# Patient Record
Sex: Female | Born: 1955 | Race: White | Hispanic: No | Marital: Married | Smoking: Former smoker
Health system: Southern US, Community
[De-identification: ages and names within clinical notes are randomized; demographics above are authoritative.]

## PROBLEM LIST (undated history)

## (undated) DIAGNOSIS — M8580 Other specified disorders of bone density and structure, unspecified site: Secondary | ICD-10-CM

## (undated) DIAGNOSIS — E041 Nontoxic single thyroid nodule: Secondary | ICD-10-CM

## (undated) DIAGNOSIS — D699 Hemorrhagic condition, unspecified: Secondary | ICD-10-CM

## (undated) DIAGNOSIS — J189 Pneumonia, unspecified organism: Secondary | ICD-10-CM

## (undated) DIAGNOSIS — N952 Postmenopausal atrophic vaginitis: Secondary | ICD-10-CM

## (undated) DIAGNOSIS — J45909 Unspecified asthma, uncomplicated: Secondary | ICD-10-CM

## (undated) DIAGNOSIS — Q159 Congenital malformation of eye, unspecified: Secondary | ICD-10-CM

## (undated) DIAGNOSIS — Z9289 Personal history of other medical treatment: Secondary | ICD-10-CM

## (undated) DIAGNOSIS — N814 Uterovaginal prolapse, unspecified: Secondary | ICD-10-CM

## (undated) DIAGNOSIS — Z923 Personal history of irradiation: Secondary | ICD-10-CM

## (undated) DIAGNOSIS — E039 Hypothyroidism, unspecified: Secondary | ICD-10-CM

## (undated) DIAGNOSIS — I10 Essential (primary) hypertension: Secondary | ICD-10-CM

## (undated) HISTORY — DX: Personal history of irradiation: Z92.3

## (undated) HISTORY — DX: Congenital malformation of eye, unspecified: Q15.9

## (undated) HISTORY — PX: VAGINAL HYSTERECTOMY: SUR661

## (undated) HISTORY — PX: ADENOIDECTOMY: SUR15

## (undated) HISTORY — PX: ANTERIOR AND POSTERIOR VAGINAL REPAIR: SUR5

## (undated) HISTORY — DX: Postmenopausal atrophic vaginitis: N95.2

## (undated) HISTORY — DX: Uterovaginal prolapse, unspecified: N81.4

## (undated) HISTORY — DX: Essential (primary) hypertension: I10

## (undated) HISTORY — PX: BLADDER SUSPENSION: SHX72

## (undated) HISTORY — DX: Other specified disorders of bone density and structure, unspecified site: M85.80

---
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
## 1982-04-13 DIAGNOSIS — Z9289 Personal history of other medical treatment: Secondary | ICD-10-CM

## 1982-04-13 HISTORY — PX: TUBAL LIGATION: SHX77

## 1982-04-13 HISTORY — DX: Personal history of other medical treatment: Z92.89

## 1998-05-24 ENCOUNTER — Other Ambulatory Visit: Admission: RE | Admit: 1998-05-24 | Discharge: 1998-05-24 | Payer: Self-pay | Admitting: Obstetrics and Gynecology

## 2000-07-07 ENCOUNTER — Other Ambulatory Visit: Admission: RE | Admit: 2000-07-07 | Discharge: 2000-07-07 | Payer: Self-pay | Admitting: Obstetrics and Gynecology

## 2001-07-08 ENCOUNTER — Other Ambulatory Visit: Admission: RE | Admit: 2001-07-08 | Discharge: 2001-07-08 | Payer: Self-pay | Admitting: Obstetrics and Gynecology

## 2002-07-26 ENCOUNTER — Other Ambulatory Visit: Admission: RE | Admit: 2002-07-26 | Discharge: 2002-07-26 | Payer: Self-pay | Admitting: Obstetrics and Gynecology

## 2003-08-13 ENCOUNTER — Other Ambulatory Visit: Admission: RE | Admit: 2003-08-13 | Discharge: 2003-08-13 | Payer: Self-pay | Admitting: Obstetrics and Gynecology

## 2004-08-26 ENCOUNTER — Other Ambulatory Visit: Admission: RE | Admit: 2004-08-26 | Discharge: 2004-08-26 | Payer: Self-pay | Admitting: Obstetrics and Gynecology

## 2005-09-14 ENCOUNTER — Other Ambulatory Visit: Admission: RE | Admit: 2005-09-14 | Discharge: 2005-09-14 | Payer: Self-pay | Admitting: Obstetrics and Gynecology

## 2006-04-15 ENCOUNTER — Encounter (INDEPENDENT_AMBULATORY_CARE_PROVIDER_SITE_OTHER): Payer: Self-pay | Admitting: Specialist

## 2006-04-15 ENCOUNTER — Inpatient Hospital Stay (HOSPITAL_COMMUNITY): Admission: RE | Admit: 2006-04-15 | Discharge: 2006-04-17 | Payer: Self-pay | Admitting: Obstetrics and Gynecology

## 2006-10-04 ENCOUNTER — Other Ambulatory Visit: Admission: RE | Admit: 2006-10-04 | Discharge: 2006-10-04 | Payer: Self-pay | Admitting: Obstetrics and Gynecology

## 2007-10-05 ENCOUNTER — Other Ambulatory Visit: Admission: RE | Admit: 2007-10-05 | Discharge: 2007-10-05 | Payer: Self-pay | Admitting: Obstetrics and Gynecology

## 2008-10-10 ENCOUNTER — Ambulatory Visit: Payer: Self-pay | Admitting: Obstetrics and Gynecology

## 2008-10-10 ENCOUNTER — Encounter: Payer: Self-pay | Admitting: Obstetrics and Gynecology

## 2008-10-10 ENCOUNTER — Other Ambulatory Visit: Admission: RE | Admit: 2008-10-10 | Discharge: 2008-10-10 | Payer: Self-pay | Admitting: Obstetrics and Gynecology

## 2008-11-06 DIAGNOSIS — E042 Nontoxic multinodular goiter: Secondary | ICD-10-CM | POA: Insufficient documentation

## 2008-12-19 ENCOUNTER — Ambulatory Visit: Payer: Self-pay | Admitting: Obstetrics and Gynecology

## 2009-08-07 ENCOUNTER — Ambulatory Visit: Payer: Self-pay | Admitting: Obstetrics and Gynecology

## 2009-08-19 ENCOUNTER — Ambulatory Visit: Payer: Self-pay | Admitting: Obstetrics and Gynecology

## 2009-11-13 ENCOUNTER — Other Ambulatory Visit: Admission: RE | Admit: 2009-11-13 | Discharge: 2009-11-13 | Payer: Self-pay | Admitting: Obstetrics and Gynecology

## 2009-11-13 ENCOUNTER — Ambulatory Visit: Payer: Self-pay | Admitting: Obstetrics and Gynecology

## 2010-01-02 ENCOUNTER — Ambulatory Visit: Payer: Self-pay | Admitting: Obstetrics and Gynecology

## 2010-02-11 ENCOUNTER — Ambulatory Visit: Payer: Self-pay | Admitting: Obstetrics and Gynecology

## 2010-08-29 NOTE — Op Note (Signed)
NAME:  Kelly Myers, Kelly Myers                ACCOUNT NO.:  [id number]   MEDICAL RECORD NO.:  [id number]          PATIENT TYPE:  AMB   LOCATION:  SDC                           FACILITY:  [facility]   PHYSICIAN:  Sigmund I. Patsi Sears, M.D.DATE OF BIRTH:  May 19, 1955   DATE OF PROCEDURE:  DATE OF DISCHARGE:                               OPERATIVE REPORT   PREOPERATIVE DIAGNOSES:  Stress urinary incontinence.   POSTOPERATIVE DIAGNOSIS:  Stress urinary incontinence.   OPERATION:  [employer] transvaginal pubovaginal sling.   SURGEON:  Sigmund I. Patsi Sears, M.D.   ASSISTANT:  Rande Brunt. Eda Paschal, M.D.   PROCEDURE:  With the patient in the dorsal lithotomy position, following  anterior vaginal vault repair and identification of rectocele repair,  dissection was accomplished to the pubovaginal cervical arch tissue  shelf.  Two areas 2 finger breadths above the pubic tubercle were  identified, and incisions made.  The [place] transvaginal obturator system  was then placed, with right angle clamp for tensioning behind the sling,  in front of the urethra.  Cystoscopy revealed that there was no evidence  of damage to the bladder, and indigo carmine was given, and ureteral  blue urine was identified bilaterally.  The sling was then tensioned,  and the wings cut subcutaneously.  These areas will be closed with  Dermabond.   The patient had underwent formal vaginal closure per Dr. Eda Paschal.  Packing was left in place by Dr. Eda Paschal.  Foley catheter was left in  place as well.      Sigmund I. Patsi Sears, M.D.  Electronically Signed     SIT/MEDQ  D:  04/15/2006  T:  04/15/2006  Job:  [id number]   cc:   Reuel Boom L. Eda Paschal, M.D.  Fax: [phone]

## 2010-08-29 NOTE — Discharge Summary (Signed)
Kelly Myers, Kelly Myers                ACCOUNT NO.:  [id number]   MEDICAL RECORD NO.:  [id number]          PATIENT TYPE:  INP   LOCATION:  9316                          FACILITY:  [facility]   PHYSICIAN:  Daniel L. Gottsegen, M.D.DATE OF BIRTH:  1955-04-23   DATE OF ADMISSION:  04/15/2006  DATE OF DISCHARGE:  04/17/2006                               DISCHARGE SUMMARY   The patient is a 55 year old gravida 2, para 2, AB 0 who had presented  to the office with symptomatic pelvic relaxation consisting of uterine  prolapse, cystocele and rectocele.  On the day of admission, she was  taken to the operating room.  A vaginal hysterectomy, anterior-posterior  repair, cauterization of endometriosis of right ovary and pubovaginal  sling procedure was performed.  She had a slight bit of oozing after the  procedure had been terminated, and this required further suturing while  she was still asleep in the OR, but after that, her course was  uneventful.  By the second postoperative day, she was voiding well  without her Foley with small residuals, and she was ready for discharge.  She was discharged on Cipro 500 mg b.i.d. for an additional 5 days.  She  was discharged on Vicodin 5/500 for pain relief.  She will return to see  Dr. Patsi Sears as per his instructions and return to see Dr. Eda Paschal  in 2 weeks.   CONDITION ON DISCHARGE:  Is improved.  Final pathology report not  available at time of dictation.   DISCHARGE DIAGNOSIS:  1. Uterine prolapse.  2. Cystocele, rectocele.  3. Endometriosis of the ovary.   OPERATION:  Vaginal hysterectomy, cauterization of endometriosis of  right ovary, anterior and posterior repair, pubovaginal sling.   CONDITION ON DISCHARGE:  Is improved.      Daniel L. Eda Paschal, M.D.  Electronically Signed     DLG/MEDQ  D:  04/17/2006  T:  04/17/2006  Job:  [id number]   cc:   Vonzell Schlatter. Patsi Sears, M.D.  Fax: [phone]

## 2010-08-29 NOTE — Op Note (Signed)
NAME:  Kelly Myers, Kelly Myers                ACCOUNT NO.:  [id number]   MEDICAL RECORD NO.:  [id number]          PATIENT TYPE:  AMB   LOCATION:  SDC                           [facility]   PHYSICIAN:  Daniel L. Gottsegen, M.D.DATE OF BIRTH:  [birth year]   DATE OF PROCEDURE:  04/15/2006  DATE OF DISCHARGE:                               OPERATIVE REPORT   PREOPERATIVE DIAGNOSIS:  Uterine prolapse, cystocele, rectocele.   POSTOPERATIVE DIAGNOSIS:  Uterine prolapse, cystocele, rectocele,  endometriosis.   OPERATION:  Vaginal hysterectomy, anterior and posterior repair,  cauterization of endometrial implant on the patient's right ovary,  pubovaginal sling.   SURGEON:  Daniel L. Eda Paschal, M.D.  Sigmund I. Patsi Sears, M.D.   FIRST ASSISTANT:  Timothy P. Fontaine, M.D.   FINDINGS:  The patient had almost third-degree uterine prolapse.  She  had a first-degree cystocele, second-degree rectocele.  Examination of  her ovaries revealed an endometrial implant on the right ovary of about  half a centimeter, when it was cauterized it did drain typical  endometriotic fluid.  Other than that there was no evidence of  endometriosis on either ovary or in the cul-de-sac.   DESCRIPTION OF PROCEDURE:  After adequate general endotracheal  anesthesia, the patient was placed in the dorsal lithotomy position,  prepped and draped in the usual sterile manner.  A 1:200,000 solution of  epinephrine and 0.5% Xylocaine was injected around the cervix.  A 360-  degree incision was made around the cervix.  The bladder was mobilized  superiorly as was the posterior peritoneum.  Posterior peritoneum and  vesicouterine fold of peritoneum were entered with sharp dissection.  The uterosacral ligaments were clamped and cut.  They were then sutured  to the vault laterally for good vault support.  It had also been  shortened when they were clamped and cut.  Cardinal ligaments, uterine  arteries, balance of the broad  ligament, utero-ovarian ligament, round  ligaments and fallopian tubes were successfully clamped, cut and suture  ligated, all with #1 chromic catgut.  Major vascular bundles were doubly  ligated.  The uterus was delivered and sent to pathology for tissue  diagnosis.  It was somewhat boggy.  There was prominent suture on the  anterior surface from her previous vertical cesarean section of Prolene,  but the uterus was not adherent to any other tissue.  The vaginal cuff  was then sutured to the posterior peritoneum with a running locking 0  Vicryl to eliminate any bleeding.  A modified McCall enterocele  suture  was placed with 2-0 Vicryl, which completely eliminated the cul-de-sac.  The anterior vaginal mucosa was then undermined to a level just below  the urethra and the perivesical fascia was sharply dissected free.   Dr. Patsi Sears then scrubbed in and he advised that we fix her cystocele  first prior to the sling and so her cystocele was reduced with  interrupted 2-0 Vicryl.  The patient had an excellent fascia  bilaterally, and this was brought together with figure-of-eights without  any problem, approximately 8 sutures were utilized.  Dr. Patsi Sears then  placed a  pubovaginal sling without any difficulty.  She was cystoscoped,  ureters were opened, and he had not entered the bladder during the  procedure.  The anterior vaginal mucosa was then closed with a running  locking 2-0 Vicryl also picking up fascia underneath the mucosa to  eliminate dead space and to strengthen the repair.  The cuff was then  closed with figure-of-eights of #1 chromic and then the cul-de-sac  suture was tied in place.   Attention was next turned to the posterior repair.  The patient had a  good perineum, so this was not touched.  Instead starting at the  introitus, it was undermined all the way to the top of the cuff.  Perirectal fascia was sharply dissected free.  The rectocele was  identified.  The  surgeon put his finger in the rectum and finished the  dissection so that the rectocele was completely isolated.  He regloved  and then the rectocele was eliminated with about 10 sutures of 2-0  Vicryl picking up perirectal fascia to help eliminate the rectocele.  Very little mucosa was trimmed away because we did not want to make the  patient too tight, as it was it was little tighter than we would have  liked but certainly not too tight for intercourse, and then the  posterior vaginal mucosa was closed with a running 2-0 Vicryl also  incorporating perirectal fascia.  Two sponge, needle, instrument counts.  Blood loss was 300 mL.  The patient was draining clear urine from her  Foley catheter that Dr. Patsi Sears had inserted at the end of his  procedure and the vagina was packed.  The surgeon was then recalled in  the room about 15 minutes later because she was oozing through the pack.  The pack was removed.  There were several oozers that were controlled  with 2-0 Vicryl.  The pack was replaced and she was watched and there  was no more bleeding.  The patient left the operating room in  satisfactory condition.      Daniel L. Eda Paschal, M.D.  Electronically Signed     DLG/MEDQ  D:  04/15/2006  T:  04/15/2006  Job:  [id number]

## 2010-08-29 NOTE — H&P (Signed)
NAME:  Kelly Myers, Kelly Myers                ACCOUNT NO.:  [id number]   MEDICAL RECORD NO.:  [id number]          PATIENT TYPE:  AMB   LOCATION:  SDC                           [facility]ACILITY:  [facility]   PHYSICIAN:  Daniel L. Gottsegen, M.D.DATE OF BIRTH:  02/24/1956   DATE OF ADMISSION:  04/15/2006  DATE OF DISCHARGE:                              HISTORY & PHYSICAL   CHIEF COMPLAINT:  Uterine prolapse.   HISTORY OF PRESENT ILLNESS:  The patient is a 55 year old gravida 2,  para 2, AB0, who has noticed over the past year a history of pelvic  pressure with a heaviness and a dragging.  She is also having trouble  getting stools out.  She is not really having any trouble with urination  or urinary incontinence.  She has significant uterine prolapse on exam,  which explains the above symptoms, as well as a significant rectocele.  As a result of the above, she now enters the hospital for vaginal  hysterectomy and posterior repair.  We have discussed the pros and cons  of removing her ovaries, including the issues with ovarian cancer, and  she feels that she would like to keep her ovaries, assuming they are  normal, and I think that is appropriate.  I had her assessed by Dr.  Jethro Bolus preoperatively, and he felt that she should have a  sling at the same time because he is concerned with her symptomatic  pelvic relaxation, that she will develop urinary stress incontinence, so  she will also undergo a sling at the same time.   PAST MEDICAL HISTORY:  1. The patient has severe toxemia and required cesarean section.  2. She has a multinodular goiter and is on Levoxyl for treatment of      that.  3. She also has multiple allergies and takes Nasacort.   SURGERIES:  1. T&A in 1960.  2. Oral surgery in 1974.  3. Cesarean section in 1984, along with a tubal ligation.   PRESENT MEDICATIONS:  1. Levoxyl 0.112 mg daily.  2. Multivitamins.  3. Vitamin C.  4. Calcium.  5. Nasacort.   ALLERGIES:  She is  allergic to no drugs.   FAMILY HISTORY:  Her mother, father and sister all are hypertensive.  Her sister has also had a myocardial infarction.  Family members with  heart disease include maternal grandfather and sister.  Breast cancer  reveals that her paternal grandmother had that.   SOCIAL HISTORY:  She is a nonsmoker and just drinks alcohol  occasionally.   REVIEW OF SYSTEMS:  HEENT:  Negative.  CARDIAC:  Negative.  RESPIRATORY:  Negative.  GI:  Negative.  MUSCULOSKELETAL:  Negative.  GU:  Negative.  NEUROLOGIC/PSYCHIATRIC:  Negative.  ALLERGIC/IMMUNOLOGIC/LYMPHATIC/ENDOCRINE:  A history of allergies, as  well as a history of multinodular goiter.  The third issue is that the  patient did bleed after her cesarean section.  She did have low  platelets at the time due to her toxemia.  She also cut herself recently  and had trouble stopping the bleeding, and she has a sister and a mother  who hemorrhaged after hysterectomy.  As a result of this, a preoperative  evaluation was done looking for a bleeding disorder including a normal  bleeding time and factor VIII.  PT and PTT were normal, as were her  white count and platelets.  A phone conversation with Dr. Cephas Darby was had, and he felt that time was safe to proceed with  surgery.  The patient is aware of the fact that she may be at high-risk  for bleeding.   PHYSICAL EXAMINATION:  GENERAL:  The patient is a well-developed, well-  nourished female in no acute distress.  VITAL SIGNS:  Blood pressure is 140/80, pulse 80 and regular,  respirations 16 and unlabored.  She is afebrile.  HEENT:  Within normal limits.  NECK:  Supple.  Trachea is in the midline.  Thyroid feels just slightly  nodular, even on her thyroid medication.  HEART/LUNGS:  Normal.  BREASTS:  No masses.  ABDOMEN:  Soft without guarding, rebound or masses.  PELVIC:  External is normal.  BUS is normal.  Vaginal examination  reveals a 1.5 degree rectocele.   Cervix is clean.  Uterus is  retroverted, normal size and shape with second degree prolapse.  Adnexa  failed to reveal masses.  RECTOVAGINAL:  Negative.  EXTREMITIES:  Within normal limits.   ADMISSION IMPRESSION:  1. Symptomatic uterine prolapse.  2. Symptomatic rectocele.   PLAN:  Vaginal hysterectomy and posterior repair.  Sling procedure to be  done by Dr. Jethro Bolus at the same time.  See above rationale.      Daniel L. Eda Paschal, M.D.  Electronically Signed     DLG/MEDQ  D:  04/14/2006  T:  04/14/2006  Job:  [id number]

## 2010-09-09 ENCOUNTER — Other Ambulatory Visit: Payer: Self-pay | Admitting: Internal Medicine

## 2010-09-09 DIAGNOSIS — Z1231 Encounter for screening mammogram for malignant neoplasm of breast: Secondary | ICD-10-CM

## 2010-10-09 ENCOUNTER — Ambulatory Visit
Admission: RE | Admit: 2010-10-09 | Discharge: 2010-10-09 | Disposition: A | Discharge status: Home / Self Care | Payer: PRIVATE HEALTH INSURANCE | Source: Ambulatory Visit | Attending: Internal Medicine | Admitting: Internal Medicine

## 2010-10-09 DIAGNOSIS — Z1231 Encounter for screening mammogram for malignant neoplasm of breast: Secondary | ICD-10-CM

## 2010-11-17 ENCOUNTER — Ambulatory Visit (INDEPENDENT_AMBULATORY_CARE_PROVIDER_SITE_OTHER): Payer: PRIVATE HEALTH INSURANCE | Admitting: Obstetrics and Gynecology

## 2010-11-17 ENCOUNTER — Other Ambulatory Visit (HOSPITAL_COMMUNITY)
Admission: RE | Admit: 2010-11-17 | Discharge: 2010-11-17 | Disposition: A | Discharge status: Home / Self Care | Payer: PRIVATE HEALTH INSURANCE | Source: Ambulatory Visit | Attending: Obstetrics and Gynecology | Admitting: Obstetrics and Gynecology

## 2010-11-17 ENCOUNTER — Encounter: Payer: Self-pay | Admitting: Obstetrics and Gynecology

## 2010-11-17 VITALS — BP 120/74 | Ht 64.5 in | Wt 118.0 lb

## 2010-11-17 DIAGNOSIS — Z01419 Encounter for gynecological examination (general) (routine) without abnormal findings: Secondary | ICD-10-CM

## 2010-11-17 DIAGNOSIS — M8580 Other specified disorders of bone density and structure, unspecified site: Secondary | ICD-10-CM

## 2010-11-17 DIAGNOSIS — E049 Nontoxic goiter, unspecified: Secondary | ICD-10-CM

## 2010-11-17 DIAGNOSIS — Z0000 Encounter for general adult medical examination without abnormal findings: Secondary | ICD-10-CM

## 2010-11-17 DIAGNOSIS — I10 Essential (primary) hypertension: Secondary | ICD-10-CM | POA: Insufficient documentation

## 2010-11-17 DIAGNOSIS — M899 Disorder of bone, unspecified: Secondary | ICD-10-CM

## 2010-11-17 DIAGNOSIS — E040 Nontoxic diffuse goiter: Secondary | ICD-10-CM

## 2010-11-17 DIAGNOSIS — N952 Postmenopausal atrophic vaginitis: Secondary | ICD-10-CM

## 2010-11-17 MED ORDER — ESTRADIOL 10 MCG VA TABS: 10.0000 ug | ORAL_TABLET | VAGINAL | Status: DC

## 2010-11-17 NOTE — Progress Notes (Signed)
The patient came back to see me today for her annual GYN exam. She also sees Dr. Evlyn Kanner position so does not need blood work done here. She is up-to-date on mammograms and bone densities. She does have low bone mass. She is using Vagifem for atrophic vaginitis with excellent results.  Physical examination: HEENT within normal limits. Neck: Thyroid not large. No masses. Supraclavicular nodes: not enlarged. Breasts: Examined in both sitting midline position. No skin changes and no masses. Abdomen: Soft no guarding rebound or masses or hernia. Pelvic: External: Within normal limits. BUS: Within normal limits. Vaginal:within normal limits. Good estrogen effect. No evidence of cystocele rectocele or enterocele. Cervix and uterus absent. Adnexa: No masses. Rectovaginal exam: Confirmatory and negative. Extremities: Within normal limits.  Assessment: Atrophic vaginitis  Plan continue Vagifem twice a week. Continue yearly mammograms. Bone density in the fall 2013.

## 2011-03-28 DIAGNOSIS — Q159 Congenital malformation of eye, unspecified: Secondary | ICD-10-CM

## 2011-03-28 HISTORY — DX: Congenital malformation of eye, unspecified: Q15.9

## 2011-04-20 ENCOUNTER — Telehealth: Payer: Self-pay | Admitting: *Deleted

## 2011-04-20 NOTE — Telephone Encounter (Signed)
Pt called c/o terrible hot flashes during day and worse at night. Pt informed make OV.

## 2011-04-22 ENCOUNTER — Ambulatory Visit: Payer: PRIVATE HEALTH INSURANCE | Admitting: Women's Health

## 2011-04-23 ENCOUNTER — Ambulatory Visit (INDEPENDENT_AMBULATORY_CARE_PROVIDER_SITE_OTHER): Payer: PRIVATE HEALTH INSURANCE | Admitting: Women's Health

## 2011-04-23 ENCOUNTER — Encounter: Payer: Self-pay | Admitting: Women's Health

## 2011-04-23 DIAGNOSIS — N951 Menopausal and female climacteric states: Secondary | ICD-10-CM

## 2011-04-23 MED ORDER — ESTRADIOL 0.05 MG/24HR TD PTWK: 1.0000 | MEDICATED_PATCH | TRANSDERMAL | Status: DC

## 2011-04-23 MED ORDER — ESTRADIOL 0.05 MG/24HR TD PTTW: 1.0000 | MEDICATED_PATCH | TRANSDERMAL | Status: DC

## 2011-04-23 NOTE — Progress Notes (Signed)
Patient ID: Kelly Myers, female   DOB: Oct 26, 1955, 56 y.o.   MRN: [id number] Presents with a complaint of numerous hot flushes most hours, poor sleep, and generally not feeling well. Has had some vision changes and does have followup with her ophthalmologist. States symptoms started several months ago and have worsened. Denies other changes, states weight is stable, routine a similar. FSH of 4 in 09. History of a hysterectomy and has used Vagifem twice weekly. Hypothyroid has been on the same dose of Synthroid.  Menopausal symptoms  Plan: FSH, TSH. Options were reviewed. Discussed Effexor or ERT. States would like to try ERT women's health initiative study was discussed reviewing risks for blood clots, strokes, and breast cancer. Will try estradiol patch 0.05 weekly, prescription proper use was given and reviewed. Will call for lab results and reviewed if no relief of symptoms to return to office.

## 2011-04-29 ENCOUNTER — Ambulatory Visit: Payer: PRIVATE HEALTH INSURANCE | Admitting: Obstetrics and Gynecology

## 2011-05-18 ENCOUNTER — Telehealth: Payer: Self-pay | Admitting: *Deleted

## 2011-05-18 MED ORDER — ESTRADIOL 1 MG PO TABS: 1.0000 mg | ORAL_TABLET | Freq: Every day | ORAL | Status: DC

## 2011-05-18 NOTE — Telephone Encounter (Signed)
Pt informed with the below note, rx sent to pharmacy. 

## 2011-05-18 NOTE — Telephone Encounter (Signed)
Pt was seen on 1/10 c/o hot flashes she was given rx for estradiol patch 0.05 mg. Pt said that pt is working great for hot flashes, but not for her skin. Pt said skin is red from irritation with patch on and when taken off skin still has irritation . Pt would like to switch to a pill form. Please advise

## 2011-05-18 NOTE — Telephone Encounter (Signed)
Try estradiol 1 mg by mouth daily #30 with refills until next annual exam. Call if problems.

## 2011-05-26 ENCOUNTER — Other Ambulatory Visit: Payer: Self-pay | Admitting: *Deleted

## 2011-05-26 MED ORDER — ESTRADIOL 1 MG PO TABS: 1.0000 mg | ORAL_TABLET | Freq: Every day | ORAL | Status: DC

## 2011-05-26 NOTE — Telephone Encounter (Signed)
Pt called to say that her mail order service still hasnt sent out her RX of the estrace 1mg  as a 3 month supply. The patient as for a 30day rx sent to a local pharmacy. rx sent pt informed

## 2011-10-12 ENCOUNTER — Other Ambulatory Visit: Payer: Self-pay | Admitting: Internal Medicine

## 2011-10-12 DIAGNOSIS — Z1231 Encounter for screening mammogram for malignant neoplasm of breast: Secondary | ICD-10-CM

## 2011-10-27 ENCOUNTER — Ambulatory Visit
Admission: RE | Admit: 2011-10-27 | Discharge: 2011-10-27 | Disposition: A | Discharge status: Home / Self Care | Payer: PRIVATE HEALTH INSURANCE | Source: Ambulatory Visit | Attending: Internal Medicine | Admitting: Internal Medicine

## 2011-10-27 DIAGNOSIS — Z1231 Encounter for screening mammogram for malignant neoplasm of breast: Secondary | ICD-10-CM

## 2011-11-18 ENCOUNTER — Ambulatory Visit (INDEPENDENT_AMBULATORY_CARE_PROVIDER_SITE_OTHER): Payer: PRIVATE HEALTH INSURANCE | Admitting: Obstetrics and Gynecology

## 2011-11-18 ENCOUNTER — Encounter: Payer: Self-pay | Admitting: Obstetrics and Gynecology

## 2011-11-18 VITALS — BP 110/70 | Ht 64.0 in | Wt 120.0 lb

## 2011-11-18 DIAGNOSIS — N814 Uterovaginal prolapse, unspecified: Secondary | ICD-10-CM | POA: Insufficient documentation

## 2011-11-18 DIAGNOSIS — N952 Postmenopausal atrophic vaginitis: Secondary | ICD-10-CM

## 2011-11-18 DIAGNOSIS — M949 Disorder of cartilage, unspecified: Secondary | ICD-10-CM

## 2011-11-18 DIAGNOSIS — Z01419 Encounter for gynecological examination (general) (routine) without abnormal findings: Secondary | ICD-10-CM

## 2011-11-18 DIAGNOSIS — M8580 Other specified disorders of bone density and structure, unspecified site: Secondary | ICD-10-CM

## 2011-11-18 DIAGNOSIS — N809 Endometriosis, unspecified: Secondary | ICD-10-CM | POA: Insufficient documentation

## 2011-11-18 DIAGNOSIS — M899 Disorder of bone, unspecified: Secondary | ICD-10-CM

## 2011-11-18 MED ORDER — ESTRADIOL 10 MCG VA TABS: 10.0000 ug | ORAL_TABLET | VAGINAL | Status: DC

## 2011-11-18 MED ORDER — ESTRADIOL 1 MG PO TABS: 1.0000 mg | ORAL_TABLET | Freq: Every day | ORAL | Status: DC

## 2011-11-18 NOTE — Patient Instructions (Addendum)
Schedule bone density.    

## 2011-11-18 NOTE — Progress Notes (Signed)
Patient came to see me today for her annual GYN exam. She remains on oral estradiol with good control of menopausal symptoms. She first tried an  estrogen patch but had problems with it. She uses Vagifem for atrophic vaginitis with excellent results. She is having no vaginal bleeding. She is having no pelvic pain. She does not have dyspareunia. She has always had normal Pap smears. Her last Pap smear was 2012. She is up-to-date on mammograms. She does her lab work through PCP. Her last bone density was September, 2011 and showed osteopenia without an elevated fracture risk.  HEENT: Within normal limits. Kelly Myers present. Neck: No masses. Supraclavicular lymph nodes: Not enlarged. Breasts: Examined in both sitting and lying position. Symmetrical without skin changes or masses. Abdomen: Soft no masses guarding or rebound. No hernias. Pelvic: External within normal limits. BUS within normal limits. Vaginal examination shows good estrogen effect, no cystocele enterocele or rectocele. Cervix and uterus absent. Adnexa within normal limits. Rectovaginal confirmatory. Extremities within normal limits.  Assessment: Menopausal symptoms  Plan: Continue estradiol 1 mg daily. Continue Vagifem. Continue yearly mammograms. Patient to  schedule bone density.

## 2011-11-19 LAB — URINALYSIS W MICROSCOPIC + REFLEX CULTURE
Bilirubin Urine: NEGATIVE
Glucose, UA: NEGATIVE mg/dL
Hgb urine dipstick: NEGATIVE
Protein, ur: NEGATIVE mg/dL

## 2012-01-12 ENCOUNTER — Ambulatory Visit (INDEPENDENT_AMBULATORY_CARE_PROVIDER_SITE_OTHER): Payer: PRIVATE HEALTH INSURANCE

## 2012-01-12 DIAGNOSIS — M949 Disorder of cartilage, unspecified: Secondary | ICD-10-CM

## 2012-01-12 DIAGNOSIS — M8580 Other specified disorders of bone density and structure, unspecified site: Secondary | ICD-10-CM

## 2012-01-12 DIAGNOSIS — M899 Disorder of bone, unspecified: Secondary | ICD-10-CM

## 2012-01-18 ENCOUNTER — Encounter: Payer: Self-pay | Admitting: Obstetrics and Gynecology

## 2012-09-28 ENCOUNTER — Other Ambulatory Visit: Payer: Self-pay

## 2012-09-28 DIAGNOSIS — Z1231 Encounter for screening mammogram for malignant neoplasm of breast: Secondary | ICD-10-CM

## 2012-10-27 ENCOUNTER — Ambulatory Visit: Payer: PRIVATE HEALTH INSURANCE

## 2012-11-09 ENCOUNTER — Ambulatory Visit
Admission: RE | Admit: 2012-11-09 | Discharge: 2012-11-09 | Disposition: A | Discharge status: Home / Self Care | Payer: PRIVATE HEALTH INSURANCE | Source: Ambulatory Visit

## 2012-11-09 DIAGNOSIS — Z1231 Encounter for screening mammogram for malignant neoplasm of breast: Secondary | ICD-10-CM

## 2012-11-10 ENCOUNTER — Other Ambulatory Visit: Payer: Self-pay | Admitting: Women's Health

## 2012-11-10 DIAGNOSIS — R928 Other abnormal and inconclusive findings on diagnostic imaging of breast: Secondary | ICD-10-CM

## 2012-11-16 ENCOUNTER — Ambulatory Visit
Admission: RE | Admit: 2012-11-16 | Discharge: 2012-11-16 | Disposition: A | Discharge status: Home / Self Care | Payer: PRIVATE HEALTH INSURANCE | Source: Ambulatory Visit | Attending: Women's Health | Admitting: Women's Health

## 2012-11-16 ENCOUNTER — Other Ambulatory Visit: Payer: Self-pay | Admitting: Gynecology

## 2012-11-16 DIAGNOSIS — R928 Other abnormal and inconclusive findings on diagnostic imaging of breast: Secondary | ICD-10-CM

## 2012-11-19 ENCOUNTER — Emergency Department (HOSPITAL_COMMUNITY): Payer: PRIVATE HEALTH INSURANCE

## 2012-11-19 ENCOUNTER — Encounter (HOSPITAL_COMMUNITY): Payer: Self-pay | Admitting: Emergency Medicine

## 2012-11-19 ENCOUNTER — Emergency Department (HOSPITAL_COMMUNITY)
Admission: EM | Admit: 2012-11-19 | Discharge: 2012-11-19 | Disposition: A | Discharge status: Home / Self Care | Payer: PRIVATE HEALTH INSURANCE | Attending: Emergency Medicine | Admitting: Emergency Medicine

## 2012-11-19 DIAGNOSIS — S61409A Unspecified open wound of unspecified hand, initial encounter: Secondary | ICD-10-CM | POA: Insufficient documentation

## 2012-11-19 DIAGNOSIS — N952 Postmenopausal atrophic vaginitis: Secondary | ICD-10-CM | POA: Insufficient documentation

## 2012-11-19 DIAGNOSIS — Z862 Personal history of diseases of the blood and blood-forming organs and certain disorders involving the immune mechanism: Secondary | ICD-10-CM | POA: Insufficient documentation

## 2012-11-19 DIAGNOSIS — Z8742 Personal history of other diseases of the female genital tract: Secondary | ICD-10-CM | POA: Insufficient documentation

## 2012-11-19 DIAGNOSIS — IMO0002 Reserved for concepts with insufficient information to code with codable children: Secondary | ICD-10-CM | POA: Insufficient documentation

## 2012-11-19 DIAGNOSIS — Z8639 Personal history of other endocrine, nutritional and metabolic disease: Secondary | ICD-10-CM | POA: Insufficient documentation

## 2012-11-19 DIAGNOSIS — Y929 Unspecified place or not applicable: Secondary | ICD-10-CM | POA: Insufficient documentation

## 2012-11-19 DIAGNOSIS — Z87891 Personal history of nicotine dependence: Secondary | ICD-10-CM | POA: Insufficient documentation

## 2012-11-19 DIAGNOSIS — S61451A Open bite of right hand, initial encounter: Secondary | ICD-10-CM

## 2012-11-19 DIAGNOSIS — Z23 Encounter for immunization: Secondary | ICD-10-CM | POA: Insufficient documentation

## 2012-11-19 DIAGNOSIS — Y9389 Activity, other specified: Secondary | ICD-10-CM | POA: Insufficient documentation

## 2012-11-19 DIAGNOSIS — Q159 Congenital malformation of eye, unspecified: Secondary | ICD-10-CM | POA: Insufficient documentation

## 2012-11-19 DIAGNOSIS — M899 Disorder of bone, unspecified: Secondary | ICD-10-CM | POA: Insufficient documentation

## 2012-11-19 DIAGNOSIS — T148XXA Other injury of unspecified body region, initial encounter: Secondary | ICD-10-CM

## 2012-11-19 DIAGNOSIS — W540XXA Bitten by dog, initial encounter: Secondary | ICD-10-CM | POA: Insufficient documentation

## 2012-11-19 DIAGNOSIS — I10 Essential (primary) hypertension: Secondary | ICD-10-CM | POA: Insufficient documentation

## 2012-11-19 DIAGNOSIS — Z79899 Other long term (current) drug therapy: Secondary | ICD-10-CM | POA: Insufficient documentation

## 2012-11-19 DIAGNOSIS — M949 Disorder of cartilage, unspecified: Secondary | ICD-10-CM | POA: Insufficient documentation

## 2012-11-19 MED ORDER — AMOXICILLIN-POT CLAVULANATE 875-125 MG PO TABS
1.0000 | ORAL_TABLET | Freq: Once | ORAL | Status: AC
  Administered 2012-11-19: 1 via ORAL
  Filled 2012-11-19: qty 1

## 2012-11-19 MED ORDER — AMOXICILLIN-POT CLAVULANATE 875-125 MG PO TABS: 1.0000 | ORAL_TABLET | Freq: Two times a day (BID) | ORAL | Status: DC

## 2012-11-19 MED ORDER — HYDROCODONE-ACETAMINOPHEN 5-325 MG PO TABS: 1.0000 | ORAL_TABLET | ORAL | Status: DC | PRN

## 2012-11-19 MED ORDER — TETANUS-DIPHTH-ACELL PERTUSSIS 5-2.5-18.5 LF-MCG/0.5 IM SUSP
0.5000 mL | Freq: Once | INTRAMUSCULAR | Status: AC
  Administered 2012-11-19: 0.5 mL via INTRAMUSCULAR
  Filled 2012-11-19: qty 0.5

## 2012-11-19 NOTE — ED Provider Notes (Signed)
CSN: [id number]     Arrival date & time 11/19/12  1844 History     First MD Initiated Contact with Patient 11/19/12 1906     Chief Complaint  Patient presents with  . Animal Bite    The history is provided by the patient. No language interpreter was used.   HPI Comments: SHADOW STIGGERS is a 57 y.o. female who presents to the Emergency Department complaining of a bite to her right hand by a dog familiar to her. The bite occurred this afternoon. She has puncture marks to her right index finger as well as to the dorsal aspect of her right hand. The pt has also experienced swelling and bruising to her hand. The bleeding is well-controlled. She is able to move the fingers on her right hand. The pt reports she believes the dog's shots are up to date. The pt does not recall her last tetanus.   Past Medical History  Diagnosis Date  . Goiter   . Osteopenia   . Atrophic vaginitis   . Uterine prolapse   . Cystocele   . Rectocele   . 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 EYE ALLERGIES  . Hypertension   . Endometriosis    Past Surgical History  Procedure Laterality Date  . Mouth surgery    . Adenoidectomy    . Cesarean section    . Tubal ligation    . Vaginal hysterectomy      Vag Hyst -A/P repair  . Bladder suspension     Family History  Problem Relation Age of Onset  . Hypertension Mother   . Hypertension Father   . Cancer Father     lung  . Hypertension Sister   . Heart disease Sister   . Heart disease Maternal Grandfather   . Breast cancer Paternal Grandmother     Age 77   History  Substance Use Topics  . Smoking status: Former Games developer  . Smokeless tobacco: Never Used  . Alcohol Use: 2.5 oz/week    5 drink(s) per week   OB History   Grav Para Term Preterm Abortions TAB SAB Ect Mult Living   2 2 1 1      2      Review of Systems  Constitutional: Negative for fever and chills.  Skin: Positive for wound.  All other systems reviewed and are negative.    Allergies   Macrobid and Macrodantin  Home Medications   Current Outpatient Rx  Name  Route  Sig  Dispense  Refill  . Calcium Carbonate-Vitamin D (CALCIUM + D PO)   Oral   Take by mouth daily.           . Cetirizine HCl (ZYRTEC ALLERGY) 10 MG CAPS   Oral   Take by mouth as needed.           . cholecalciferol (VITAMIN D) 1000 UNITS tablet   Oral   Take 1,000 Units by mouth daily.           Marland Kitchen estradiol (ESTRACE) 1 MG tablet   Oral   Take 1 tablet (1 mg total) by mouth daily.   90 tablet   4   . Estradiol (VAGIFEM) 10 MCG TABS   Vaginal   Place 1 tablet (10 mcg total) vaginally 2 (two) times a week.   24 tablet   4   . fish oil-omega-3 fatty acids 1000 MG capsule   Oral   Take 2 g by mouth daily.           Marland Kitchen  fluticasone (VERAMYST) 27.5 MCG/SPRAY nasal spray   Nasal   Place 2 sprays into the nose daily.           Marland Kitchen levothyroxine (SYNTHROID, LEVOTHROID) 112 MCG tablet   Oral   Take 112 mcg by mouth daily.           Marland Kitchen lisinopril (PRINIVIL,ZESTRIL) 10 MG tablet   Oral   Take 10 mg by mouth daily.           . montelukast (SINGULAIR) 10 MG tablet   Oral   Take 10 mg by mouth at bedtime.            Triage Vitals: BP 181/100  Pulse 83  Resp 17  SpO2 99%  Physical Exam  Nursing note and vitals reviewed. Constitutional: She is oriented to person, place, and time. She appears well-developed and well-nourished. No distress.  HENT:  Head: Normocephalic and atraumatic.  Eyes: EOM are normal.  Neck: Neck supple. No tracheal deviation present.  Cardiovascular: Normal rate.   Pulmonary/Chest: Effort normal. No respiratory distress.  Musculoskeletal: Normal range of motion.  Neurological: She is alert and oriented to person, place, and time.  Skin: Skin is warm and dry.  2 small puncture wounds to dorsal aspect of right hand and multiple puncture wounds around base of right index finger. Full ROM. Bruising noted to the palmar aspect at base of right index finger.    Psychiatric: She has a normal mood and affect. Her behavior is normal.    ED Course   DIAGNOSTIC STUDIES: Oxygen Saturation is 99% on room air, normal by my interpretation.    COORDINATION OF CARE:  7:09 PM-  Discussed treatment plan with patient which includes a tetanus booster and imaging, and the patient agreed to the plan.   Procedures (including critical care time)  Labs Reviewed - No data to display Dg Hand Complete Right  11/19/2012   *RADIOLOGY REPORT*  Clinical Data: Dog bite  RIGHT HAND - COMPLETE 3+ VIEW  Comparison: None.  Findings: No acute fracture and no dislocation.  IMPRESSION: Acute bony pathology.   Original Report Authenticated By: Jolaine Click, M.D.   1. Animal bite of hand, right, initial encounter   2. Puncture wound     MDM  Wound cleaned:pt has full rom:pt given strict follow up instructions:pt given tetanus:no need for rabies:pt given hand follow up and augmentin and vicodin  I personally performed the services described in this documentation, which was scribed in my presence. The recorded information has been reviewed and is accurate.    Teressa Lower, NP 11/19/12 2001

## 2012-11-19 NOTE — ED Notes (Signed)
Pt's finger began bleeding again after she went to bathroom,  Pt's hand rebandaged and cleaned,  Pt given verbal instructions, and she states she and her husband understand,  Discharging per Virgel Manifold RN

## 2012-11-19 NOTE — ED Notes (Signed)
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

## 2012-11-23 NOTE — ED Provider Notes (Signed)
Medical screening examination/treatment/procedure(s) were performed by non-physician practitioner and as supervising physician I was immediately available for consultation/collaboration.  Derwood Kaplan, MD 11/23/12 1425

## 2012-11-30 ENCOUNTER — Ambulatory Visit (INDEPENDENT_AMBULATORY_CARE_PROVIDER_SITE_OTHER): Payer: 59 | Admitting: Women's Health

## 2012-11-30 ENCOUNTER — Encounter: Payer: Self-pay | Admitting: Women's Health

## 2012-11-30 VITALS — BP 126/80 | Ht 64.0 in | Wt 121.0 lb

## 2012-11-30 DIAGNOSIS — N8111 Cystocele, midline: Secondary | ICD-10-CM

## 2012-11-30 DIAGNOSIS — Z01419 Encounter for gynecological examination (general) (routine) without abnormal findings: Secondary | ICD-10-CM

## 2012-11-30 DIAGNOSIS — N814 Uterovaginal prolapse, unspecified: Secondary | ICD-10-CM

## 2012-11-30 DIAGNOSIS — IMO0002 Reserved for concepts with insufficient information to code with codable children: Secondary | ICD-10-CM

## 2012-11-30 DIAGNOSIS — N952 Postmenopausal atrophic vaginitis: Secondary | ICD-10-CM

## 2012-11-30 MED ORDER — ESTRADIOL 1 MG PO TABS: 1.0000 mg | ORAL_TABLET | Freq: Every day | ORAL | Status: DC

## 2012-11-30 MED ORDER — ESTRADIOL 10 MCG VA TABS: 10.0000 ug | ORAL_TABLET | VAGINAL | Status: DC

## 2012-11-30 NOTE — Patient Instructions (Addendum)
Health Recommendations for Postmenopausal Women Respected and ongoing research has looked at the most common causes of death, disability, and poor quality of life in postmenopausal women. The causes include heart disease, diseases of blood vessels, diabetes, depression, cancer, and bone loss (osteoporosis). Many things can be done to help lower the chances of developing these and other common problems: CARDIOVASCULAR DISEASE Heart Disease: A heart attack is a medical emergency. Know the signs and symptoms of a heart attack. Below are things women can do to reduce their risk for heart disease.   Do not smoke. If you smoke, quit.  Aim for a healthy weight. Being overweight causes many preventable deaths. Eat a healthy and balanced diet and drink an adequate amount of liquids.  Get moving. Make a commitment to be more physically active. Aim for 30 minutes of activity on most, if not all days of the week.  Eat for heart health. Choose a diet that is low in saturated fat and cholesterol and eliminate trans fat. Include whole grains, vegetables, and fruits. Read and understand the labels on food containers before buying.  Know your numbers. Ask your caregiver to check your blood pressure, cholesterol (total, HDL, LDL, triglycerides) and blood glucose. Work with your caregiver on improving your entire clinical picture.  High blood pressure. Limit or stop your table salt intake (try salt substitute and food seasonings). Avoid salty foods and drinks. Read labels on food containers before buying. Eating well and exercising can help control high blood pressure. STROKE  Stroke is a medical emergency. Stroke may be the result of a blood clot in a blood vessel in the brain or by a brain hemorrhage (bleeding). Know the signs and symptoms of a stroke. To lower the risk of developing a stroke:  Avoid fatty foods.  Quit smoking.  Control your diabetes, blood pressure, and irregular heart rate. THROMBOPHLEBITIS  (BLOOD CLOT) OF THE LEG  Becoming overweight and leading a stationary lifestyle may also contribute to developing blood clots. Controlling your diet and exercising will help lower the risk of developing blood clots. CANCER SCREENING  Breast Cancer: Take steps to reduce your risk of breast cancer.  You should practice "breast self-awareness." This means understanding the normal appearance and feel of your breasts and should include breast self-examination. Any changes detected, no matter how small, should be reported to your caregiver.  After age 40, you should have a clinical breast exam (CBE) every year.  Starting at age 40, you should consider having a mammogram (breast X-ray) every year.  If you have a family history of breast cancer, talk to your caregiver about genetic screening.  If you are at high risk for breast cancer, talk to your caregiver about having an MRI and a mammogram every year.  Intestinal or Stomach Cancer: Tests to consider are a rectal exam, fecal occult blood, sigmoidoscopy, and colonoscopy. Women who are high risk may need to be screened at an earlier age and more often.  Cervical Cancer:  Beginning at age 30, you should have a Pap test every 3 years as long as the past 3 Pap tests have been normal.  If you have had past treatment for cervical cancer or a condition that could lead to cancer, you need Pap tests and screening for cancer for at least 20 years after your treatment.  If you had a hysterectomy for a problem that was not cancer or a condition that could lead to cancer, then you no longer need Pap tests.    If you are between ages 65 and 70, and you have had normal Pap tests going back 10 years, you no longer need Pap tests.  If Pap tests have been discontinued, risk factors (such as a new sexual partner) need to be reassessed to determine if screening should be resumed.  Some medical problems can increase the chance of getting cervical cancer. In these  cases, your caregiver may recommend more frequent screening and Pap tests.  Uterine Cancer: If you have vaginal bleeding after reaching menopause, you should notify your caregiver.  Ovarian cancer: Other than yearly pelvic exams, there are no reliable tests available to screen for ovarian cancer at this time except for yearly pelvic exams.  Lung Cancer: Yearly chest X-rays can detect lung cancer and should be done on high risk women, such as cigarette smokers and women with chronic lung disease (emphysema).  Skin Cancer: A complete body skin exam should be done at your yearly examination. Avoid overexposure to the sun and ultraviolet light lamps. Use a strong sun block cream when in the sun. All of these things are important in lowering the risk of skin cancer. MENOPAUSE Menopause Symptoms: Hormone therapy products are effective for treating symptoms associated with menopause:  Moderate to severe hot flashes.  Night sweats.  Mood swings.  Headaches.  Tiredness.  Loss of sex drive.  Insomnia.  Other symptoms. Hormone replacement carries certain risks, especially in older women. Women who use or are thinking about using estrogen or estrogen with progestin treatments should discuss that with their caregiver. Your caregiver will help you understand the benefits and risks. The ideal dose of hormone replacement therapy is not known. The Food and Drug Administration (FDA) has concluded that hormone therapy should be used only at the lowest doses and for the shortest amount of time to reach treatment goals.  OSTEOPOROSIS Protecting Against Bone Loss and Preventing Fracture: If you use hormone therapy for prevention of bone loss (osteoporosis), the risks for bone loss must outweigh the risk of the therapy. Ask your caregiver about other medications known to be safe and effective for preventing bone loss and fractures. To guard against bone loss or fractures, the following is recommended:  If  you are less than age 50, take 1000 mg of calcium and at least 600 mg of Vitamin D per day.  If you are greater than age 50 but less than age 70, take 1200 mg of calcium and at least 600 mg of Vitamin D per day.  If you are greater than age 70, take 1200 mg of calcium and at least 800 mg of Vitamin D per day. Smoking and excessive alcohol intake increases the risk of osteoporosis. Eat foods rich in calcium and vitamin D and do weight bearing exercises several times a week as your caregiver suggests. DIABETES Diabetes Melitus: If you have Type I or Type 2 diabetes, you should keep your blood sugar under control with diet, exercise and recommended medication. Avoid too many sweets, starchy and fatty foods. Being overweight can make control more difficult. COGNITION AND MEMORY Cognition and Memory: Menopausal hormone therapy is not recommended for the prevention of cognitive disorders such as Alzheimer's disease or memory loss.  DEPRESSION  Depression may occur at any age, but is common in elderly women. The reasons may be because of physical, medical, social (loneliness), or financial problems and needs. If you are experiencing depression because of medical problems and control of symptoms, talk to your caregiver about this. Physical activity and   exercise may help with mood and sleep. Community and volunteer involvement may help your sense of value and worth. If you have depression and you feel that the problem is getting worse or becoming severe, talk to your caregiver about treatment options that are best for you. ACCIDENTS  Accidents are common and can be serious in the elderly woman. Prepare your house to prevent accidents. Eliminate throw rugs, place hand bars in the bath, shower and toilet areas. Avoid wearing high heeled shoes or walking on wet, snowy, and icy areas. Limit or stop driving if you have vision or hearing problems, or you feel you are unsteady with you movements and  reflexes. HEPATITIS C Hepatitis C is a type of viral infection affecting the liver. It is spread mainly through contact with blood from an infected person. It can be treated, but if left untreated, it can lead to severe liver damage over years. Many people who are infected do not know that the virus is in their blood. If you are a "baby-boomer", it is recommended that you have one screening test for Hepatitis C. IMMUNIZATIONS  Several immunizations are important to consider having during your senior years, including:   Tetanus, diptheria, and pertussis booster shot.  Influenza every year before the flu season begins.  Pneumonia vaccine.  Shingles vaccine.  Others as indicated based on your specific needs. Talk to your caregiver about these. Document Released: 05/22/2005 Document Revised: 03/16/2012 Document Reviewed: 01/16/2008 [employer] Patient Information 2014 [employer].  

## 2012-11-30 NOTE — Progress Notes (Signed)
Kelly Myers September 21, 1955 [id number]    History:    The patient presents for annual exam.  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 Patsi Sears in 2008. Estradiol 1 mg of Vagifem twice weekly. Has had zostavac.  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 DEXA 2013 T score of -1.2  left femoral neck, FRAX 5.5%/0.5%. Negative colonoscopy at age 57. Normal Pap and mammogram history. Had diagnostic mammogram 10/2012 normal.   Past medical history, past surgical history, family history and social history were all reviewed and documented in the EPIC chart.  Hypertension/hypothyroid/allergies-primary care manages. Hypertension-mother, father, sister. Breast cancer paternal grandmother. Lung cancer father.   ROS:  A  ROS was performed and pertinent positives and negatives are included in the history.  Exam:  Filed Vitals:   11/30/12 1004  BP: 126/80    General appearance:  Normal Head/Neck:  Normal, without cervical or supraclavicular adenopathy. Thyroid:  Symmetrical, normal in size, without palpable masses or nodularity. Respiratory  Effort:  Normal  Auscultation:  Clear without wheezing or rhonchi Cardiovascular  Auscultation:  Regular rate, without rubs, murmurs or gallops  Edema/varicosities:  Not grossly evident Abdominal  Soft,nontender, without masses, guarding or rebound.  Liver/spleen:  No organomegaly noted  Hernia:  None appreciated  Skin  Inspection:  Grossly normal  Palpation:  Grossly normal Neurologic/psychiatric  Orientation:  Normal with appropriate conversation.  Mood/affect:  Normal  Genitourinary    Breasts: Examined lying and sitting.     Right: Without masses, retractions, discharge or axillary adenopathy.     Left: Without masses, retractions, discharge or axillary adenopathy.   Inguinal/mons:  Normal without inguinal adenopathy  External genitalia:  Normal  BUS/Urethra/Skene's glands:  Normal  Bladder:  Normal  Vagina:   Normal  Cervix:  absent  Adnexa/parametria:     Rt: Without masses or tenderness.   Lt: Without masses or tenderness.  Anus and perineum: Normal  Digital rectal exam: Normal sphincter tone without palpated masses or tenderness  Assessment/Plan:  57 y.o. M. WF G2 P2 for annual exam with no complaints  TVH with A&P repair on HRT Osteopenia Hypertension/hypothyroid-primary care manages labs and meds  Plan: Estradiol 1 mg and Vagifem twice weekly prescription, proper use, slight risk for blood clots/strokes and breast cancer reviewed. Reviewed decreasing estradiol dose to wean off. SBE's, continue annual mammogram, calcium rich diet, vitamin D 2000 daily encouraged. Home safety, fall prevention discussed.    [facility], 12:22 PM 11/30/2012

## 2013-10-30 ENCOUNTER — Other Ambulatory Visit: Payer: Self-pay

## 2013-10-30 DIAGNOSIS — Z1231 Encounter for screening mammogram for malignant neoplasm of breast: Secondary | ICD-10-CM

## 2013-11-13 ENCOUNTER — Ambulatory Visit
Admission: RE | Admit: 2013-11-13 | Discharge: 2013-11-13 | Disposition: A | Discharge status: Home / Self Care | Payer: 59 | Source: Ambulatory Visit

## 2013-11-13 DIAGNOSIS — Z1231 Encounter for screening mammogram for malignant neoplasm of breast: Secondary | ICD-10-CM

## 2013-12-05 ENCOUNTER — Encounter: Payer: 59 | Admitting: Women's Health

## 2013-12-13 ENCOUNTER — Encounter: Payer: Self-pay | Admitting: Women's Health

## 2013-12-13 ENCOUNTER — Ambulatory Visit (INDEPENDENT_AMBULATORY_CARE_PROVIDER_SITE_OTHER): Payer: 59 | Admitting: Women's Health

## 2013-12-13 VITALS — BP 134/74 | Ht 64.5 in | Wt 123.0 lb

## 2013-12-13 DIAGNOSIS — M899 Disorder of bone, unspecified: Secondary | ICD-10-CM

## 2013-12-13 DIAGNOSIS — M949 Disorder of cartilage, unspecified: Secondary | ICD-10-CM

## 2013-12-13 DIAGNOSIS — B3731 Acute candidiasis of vulva and vagina: Secondary | ICD-10-CM

## 2013-12-13 DIAGNOSIS — Z01419 Encounter for gynecological examination (general) (routine) without abnormal findings: Secondary | ICD-10-CM

## 2013-12-13 DIAGNOSIS — B373 Candidiasis of vulva and vagina: Secondary | ICD-10-CM

## 2013-12-13 DIAGNOSIS — N952 Postmenopausal atrophic vaginitis: Secondary | ICD-10-CM

## 2013-12-13 DIAGNOSIS — M8580 Other specified disorders of bone density and structure, unspecified site: Secondary | ICD-10-CM

## 2013-12-13 LAB — WET PREP FOR TRICH, YEAST, CLUE
Clue Cells Wet Prep HPF POC: NONE SEEN
TRICH WET PREP: NONE SEEN
WBC, Wet Prep HPF POC: NONE SEEN
Yeast Wet Prep HPF POC: NONE SEEN

## 2013-12-13 MED ORDER — ESTRADIOL 10 MCG VA TABS: 10.0000 ug | ORAL_TABLET | VAGINAL | Status: DC

## 2013-12-13 MED ORDER — TERCONAZOLE 0.4 % VA CREA: 1.0000 | TOPICAL_CREAM | Freq: Every day | VAGINAL | Status: DC

## 2013-12-13 MED ORDER — ESTRADIOL 1 MG PO TABS: 1.0000 mg | ORAL_TABLET | Freq: Every day | ORAL | Status: DC

## 2013-12-13 NOTE — Patient Instructions (Signed)
Health Recommendations for Postmenopausal Women Respected and ongoing research has looked at the most common causes of death, disability, and poor quality of life in postmenopausal women. The causes include heart disease, diseases of blood vessels, diabetes, depression, cancer, and bone loss (osteoporosis). Many things can be done to help lower the chances of developing these and other common problems. CARDIOVASCULAR DISEASE Heart Disease: A heart attack is a medical emergency. Know the signs and symptoms of a heart attack. Below are things women can do to reduce their risk for heart disease.   Do not smoke. If you smoke, quit.  Aim for a healthy weight. Being overweight causes many preventable deaths. Eat a healthy and balanced diet and drink an adequate amount of liquids.  Get moving. Make a commitment to be more physically active. Aim for 30 minutes of activity on most, if not all days of the week.  Eat for heart health. Choose a diet that is low in saturated fat and cholesterol and eliminate trans fat. Include whole grains, vegetables, and fruits. Read and understand the labels on food containers before buying.  Know your numbers. Ask your caregiver to check your blood pressure, cholesterol (total, HDL, LDL, triglycerides) and blood glucose. Work with your caregiver on improving your entire clinical picture.  High blood pressure. Limit or stop your table salt intake (try salt substitute and food seasonings). Avoid salty foods and drinks. Read labels on food containers before buying. Eating well and exercising can help control high blood pressure. STROKE  Stroke is a medical emergency. Stroke may be the result of a blood clot in a blood vessel in the brain or by a brain hemorrhage (bleeding). Know the signs and symptoms of a stroke. To lower the risk of developing a stroke:  Avoid fatty foods.  Quit smoking.  Control your diabetes, blood pressure, and irregular heart rate. THROMBOPHLEBITIS  (BLOOD CLOT) OF THE LEG  Becoming overweight and leading a stationary lifestyle may also contribute to developing blood clots. Controlling your diet and exercising will help lower the risk of developing blood clots. CANCER SCREENING  Breast Cancer: Take steps to reduce your risk of breast cancer.  You should practice "breast self-awareness." This means understanding the normal appearance and feel of your breasts and should include breast self-examination. Any changes detected, no matter how small, should be reported to your caregiver.  After age 40, you should have a clinical breast exam (CBE) every year.  Starting at age 40, you should consider having a mammogram (breast X-ray) every year.  If you have a family history of breast cancer, talk to your caregiver about genetic screening.  If you are at high risk for breast cancer, talk to your caregiver about having an MRI and a mammogram every year.  Intestinal or Stomach Cancer: Tests to consider are a rectal exam, fecal occult blood, sigmoidoscopy, and colonoscopy. Women who are high risk may need to be screened at an earlier age and more often.  Cervical Cancer:  Beginning at age 30, you should have a Pap test every 3 years as long as the past 3 Pap tests have been normal.  If you have had past treatment for cervical cancer or a condition that could lead to cancer, you need Pap tests and screening for cancer for at least 20 years after your treatment.  If you had a hysterectomy for a problem that was not cancer or a condition that could lead to cancer, then you no longer need Pap tests.    If you are between ages 65 and 70, and you have had normal Pap tests going back 10 years, you no longer need Pap tests.  If Pap tests have been discontinued, risk factors (such as a new sexual partner) need to be reassessed to determine if screening should be resumed.  Some medical problems can increase the chance of getting cervical cancer. In these  cases, your caregiver may recommend more frequent screening and Pap tests.  Uterine Cancer: If you have vaginal bleeding after reaching menopause, you should notify your caregiver.  Ovarian Cancer: Other than yearly pelvic exams, there are no reliable tests available to screen for ovarian cancer at this time except for yearly pelvic exams.  Lung Cancer: Yearly chest X-rays can detect lung cancer and should be done on high risk women, such as cigarette smokers and women with chronic lung disease (emphysema).  Skin Cancer: A complete body skin exam should be done at your yearly examination. Avoid overexposure to the sun and ultraviolet light lamps. Use a strong sun block cream when in the sun. All of these things are important for lowering the risk of skin cancer. MENOPAUSE Menopause Symptoms: Hormone therapy products are effective for treating symptoms associated with menopause:  Moderate to severe hot flashes.  Night sweats.  Mood swings.  Headaches.  Tiredness.  Loss of sex drive.  Insomnia.  Other symptoms. Hormone replacement carries certain risks, especially in older women. Women who use or are thinking about using estrogen or estrogen with progestin treatments should discuss that with their caregiver. Your caregiver will help you understand the benefits and risks. The ideal dose of hormone replacement therapy is not known. The Food and Drug Administration (FDA) has concluded that hormone therapy should be used only at the lowest doses and for the shortest amount of time to reach treatment goals.  OSTEOPOROSIS Protecting Against Bone Loss and Preventing Fracture If you use hormone therapy for prevention of bone loss (osteoporosis), the risks for bone loss must outweigh the risk of the therapy. Ask your caregiver about other medications known to be safe and effective for preventing bone loss and fractures. To guard against bone loss or fractures, the following is recommended:  If  you are younger than age 50, take 1000 mg of calcium and at least 600 mg of Vitamin D per day.  If you are older than age 50 but younger than age 70, take 1200 mg of calcium and at least 600 mg of Vitamin D per day.  If you are older than age 70, take 1200 mg of calcium and at least 800 mg of Vitamin D per day. Smoking and excessive alcohol intake increases the risk of osteoporosis. Eat foods rich in calcium and vitamin D and do weight bearing exercises several times a week as your caregiver suggests. DIABETES Diabetes Mellitus: If you have type I or type 2 diabetes, you should keep your blood sugar under control with diet, exercise, and recommended medication. Avoid starchy and fatty foods, and too many sweets. Being overweight can make diabetes control more difficult. COGNITION AND MEMORY Cognition and Memory: Menopausal hormone therapy is not recommended for the prevention of cognitive disorders such as Alzheimer's disease or memory loss.  DEPRESSION  Depression may occur at any age, but it is common in elderly women. This may be because of physical, medical, social (loneliness), or financial problems and needs. If you are experiencing depression because of medical problems and control of symptoms, talk to your caregiver about this. Physical   activity and exercise may help with mood and sleep. Community and volunteer involvement may improve your sense of value and worth. If you have depression and you feel that the problem is getting worse or becoming severe, talk to your caregiver about which treatment options are best for you. ACCIDENTS  Accidents are common and can be serious in elderly woman. Prepare your house to prevent accidents. Eliminate throw rugs, place hand bars in bath, shower, and toilet areas. Avoid wearing high heeled shoes or walking on wet, snowy, and icy areas. Limit or stop driving if you have vision or hearing problems, or if you feel you are unsteady with your movements and  reflexes. HEPATITIS C Hepatitis C is a type of viral infection affecting the liver. It is spread mainly through contact with blood from an infected person. It can be treated, but if left untreated, it can lead to severe liver damage over the years. Many people who are infected do not know that the virus is in their blood. If you are a "baby-boomer", it is recommended that you have one screening test for Hepatitis C. IMMUNIZATIONS  Several immunizations are important to consider having during your senior years, including:   Tetanus, diphtheria, and pertussis booster shot.  Influenza every year before the flu season begins.  Pneumonia vaccine.  Shingles vaccine.  Others, as indicated based on your specific needs. Talk to your caregiver about these. Document Released: 05/22/2005 Document Revised: 08/14/2013 Document Reviewed: 01/16/2008 Exit[employer] Patient Information 2015 [employer]. This information is not intended to replace advice given to you by your health care provider. Make sure you discuss any questions you have with your health care provider.  

## 2013-12-13 NOTE — Progress Notes (Signed)
Kelly Myers 1955/10/13 [id number]    History:    Presents for annual exam.  2008 TVH with A&P repair on estradiol 1 mg and Vagifem twice weekly. Has tried to decrease estradiol but has had numerous hot flushes. Normal Pap and mammogram history. 2013 T score -1.2 left femoral neck FRAX 5.5%/0.5%. Negative colonoscopy at age 58. Hypertension/hypothyroidism/asthma-primary care manages.  Past medical history, past surgical history, family history and social history were all reviewed and documented in the EPIC chart. Professional dog walker. Mother, father, sister - hypertension. Paternal grandmother breast cancer at 28. 2 daughters ages 68 and 23, no grandchildren.  ROS:  A  12 point ROS was performed and pertinent positives and negatives are included.  Exam:  Filed Vitals:   12/13/13 1412  BP: 134/74    General appearance:  Normal Thyroid:  Symmetrical, normal in size, without palpable masses or nodularity. Respiratory  Auscultation:  Clear without wheezing or rhonchi Cardiovascular  Auscultation:  Regular rate, without rubs, murmurs or gallops  Edema/varicosities:  Not grossly evident Abdominal  Soft,nontender, without masses, guarding or rebound.  Liver/spleen:  No organomegaly noted  Hernia:  None appreciated  Skin  Inspection:  Grossly normal   Breasts: Examined lying and sitting.     Right: Without masses, retractions, discharge or axillary adenopathy.     Left: Without masses, retractions, discharge or axillary adenopathy. Gentitourinary   Inguinal/mons:  Normal without inguinal adenopathy  External genitalia: Erythematous at introitus, wet prep negative  BUS/Urethra/Skene's glands:  Normal  Vagina:  Normal  Cervix:  Absent Uterus:  Absent  Adnexa/parametria:     Rt: Without masses or tenderness.   Lt: Without masses or tenderness.  Anus and perineum: Normal  Digital rectal exam: Normal sphincter tone without palpated masses or tenderness  Assessment/Plan:  58 y.o.  MWF G2P2 for annual exam with no complaints.  2008 TVH with A&P repair on estradiol and Vagifem Osteopenia Hypertension/hyperthyroidism/allergies-primary care manages labs and meds  Plan: Women's health initiative reviewed, best to be on estrogen shortest amount of time and lowest dose, will try to decrease estradiol half tablet every other day to wean down. Prescription, proper use given and reviewed risk for blood clots, strokes, breast cancer. Vagifem twice weekly prescription, proper use given and reviewed. Has been having vaginal itching mostly external wet prep negative, will try Terazol 7 small amount externally, if no relief instructed to call. SBE's, continue annual mammogram, calcium rich diet, vitamin D 2000 daily. Home safety, fall prevention discussed. Repeat DEXA will schedule.  Note: This dictation was prepared with Dragon/digital dictation.  Any transcriptional errors that result are unintentional. Huel Cote [facility], 2:35 PM 12/13/2013

## 2014-01-01 ENCOUNTER — Other Ambulatory Visit: Payer: Self-pay | Admitting: Gynecology

## 2014-01-01 DIAGNOSIS — M8580 Other specified disorders of bone density and structure, unspecified site: Secondary | ICD-10-CM

## 2014-01-16 ENCOUNTER — Ambulatory Visit (INDEPENDENT_AMBULATORY_CARE_PROVIDER_SITE_OTHER): Payer: 59

## 2014-01-16 ENCOUNTER — Encounter: Payer: Self-pay | Admitting: Gynecology

## 2014-01-16 DIAGNOSIS — M8580 Other specified disorders of bone density and structure, unspecified site: Secondary | ICD-10-CM

## 2014-02-12 ENCOUNTER — Encounter: Payer: Self-pay | Admitting: Gynecology

## 2014-05-09 ENCOUNTER — Ambulatory Visit (INDEPENDENT_AMBULATORY_CARE_PROVIDER_SITE_OTHER): Payer: 59 | Admitting: Podiatry

## 2014-05-09 VITALS — BP 129/77 | HR 77 | Resp 16

## 2014-05-09 DIAGNOSIS — M2041 Other hammer toe(s) (acquired), right foot: Secondary | ICD-10-CM

## 2014-05-09 DIAGNOSIS — L600 Ingrowing nail: Secondary | ICD-10-CM

## 2014-05-09 DIAGNOSIS — M779 Enthesopathy, unspecified: Secondary | ICD-10-CM

## 2014-05-09 MED ORDER — TRIAMCINOLONE ACETONIDE 10 MG/ML IJ SUSP
10.0000 mg | Freq: Once | INTRAMUSCULAR | Status: AC
  Administered 2014-05-09: 10 mg

## 2014-05-09 NOTE — Progress Notes (Signed)
   Subjective:    Patient ID: Kelly Myers, female    DOB: 07/21/1955, 59 y.o.   MRN: [id number]  HPI Comments: "I have a bad toe"  Patient c/o tender 1st toe left, both borders, for few months. She has been trimming out some. Worse with dress shoes.     Toe Pain       Review of Systems  HENT: Positive for sinus pressure.   Eyes: Positive for itching.  Allergic/Immunologic: Positive for environmental allergies.  All other systems reviewed and are negative.      Objective:   Physical Exam        Assessment & Plan:

## 2014-05-09 NOTE — Patient Instructions (Signed)

## 2014-05-10 NOTE — Progress Notes (Signed)
Subjective:     Patient ID: Kelly Myers, female   DOB: 1956/01/29, 59 y.o.   MRN: [id number]  HPI patient states that I have an ingrown toenail on my left big toe both borders and I get pain between the fourth and fifth toes of my right foot with fluid buildup and irritation   Review of Systems  All other systems reviewed and are negative.      Objective:   Physical Exam  Constitutional: She is oriented to person, place, and time.  Cardiovascular: Intact distal pulses.   Musculoskeletal: Normal range of motion.  Neurological: She is oriented to person, place, and time.  Skin: Skin is warm.  Nursing note and vitals reviewed.  neurovascular status intact with muscle strength adequate and range of motion subtalar and midtarsal joint within normal limits. Patient's found to have an incurvated hallux nail left both medial lateral borders and inflammatory area at the head of the proximal phalanx lateral side right fourth toe with pain     Assessment:     Chronic ingrown toenail deformity left hallux medial lateral borders and inflammatory capsular lesion right fourth toe    Plan:     H&P and conditions discussed and today I did a careful injection of the interphalangeal joint capsule lateral side with 2 mg dexamethasone Kenalog 2 g Xylocaine right fourth toe and discussed ingrown toenail deformity left and correction with risk. Patient wants procedure and today I infiltrated 60 mg Xylocaine Marcaine mixture remove the medial lateral borders left hallux exposed matrix and applied phenol 3 applications 30 seconds followed by alcohol lavaged indicate each corner. Patient had sterile dressing applied and was given instructions on soaks

## 2014-05-17 ENCOUNTER — Encounter: Payer: Self-pay | Admitting: Podiatry

## 2014-05-17 ENCOUNTER — Ambulatory Visit (INDEPENDENT_AMBULATORY_CARE_PROVIDER_SITE_OTHER): Payer: 59 | Admitting: Podiatry

## 2014-05-17 VITALS — BP 141/87 | HR 84 | Resp 16

## 2014-05-17 DIAGNOSIS — L600 Ingrowing nail: Secondary | ICD-10-CM

## 2014-05-17 MED ORDER — CEPHALEXIN 500 MG PO CAPS: 500.0000 mg | ORAL_CAPSULE | Freq: Two times a day (BID) | ORAL | Status: DC

## 2014-05-18 NOTE — Progress Notes (Signed)
Subjective:     Patient ID: Kelly Myers, female   DOB: 03-06-1956, 59 y.o.   MRN: [id number]  HPI patient presents stating the nailbeds on my left big toe is concerned about   Review of Systems     Objective:   Physical Exam Neurovascular status unchanged with a slightly inflamed lateral border left hallux that's localized in nature    Assessment:     Mild paronychia    Plan:     Precautionary stage I did place on cephalexin 500 mg twice a day and if symptoms do not improve we'll consider cleaning the area out

## 2014-06-26 ENCOUNTER — Other Ambulatory Visit: Payer: Self-pay | Admitting: Dermatology

## 2014-07-09 ENCOUNTER — Ambulatory Visit (INDEPENDENT_AMBULATORY_CARE_PROVIDER_SITE_OTHER): Payer: 59 | Admitting: Podiatry

## 2014-07-09 VITALS — BP 120/80 | HR 84 | Resp 16

## 2014-07-09 DIAGNOSIS — L03012 Cellulitis of left finger: Secondary | ICD-10-CM

## 2014-07-09 NOTE — Patient Instructions (Signed)

## 2014-07-10 NOTE — Progress Notes (Signed)
Subjective:     Patient ID: Kelly Myers, female   DOB: 1955-08-20, 59 y.o.   MRN: [id number]  HPI patient presents stating my left big toe started to turn red again and it's irritated in the corner   Review of Systems     Objective:   Physical Exam Neurovascular status intact muscle strength adequate with inflammation at the proximal portion of the left hallux lateral border that's localized with no proximal spread    Assessment:     Mild paronychia infection left hallux but it does appear there may be a small abscess    Plan:     Infiltrated left hallux 60 Milligan times like Marcaine mixture carefully removed the lateral border and necrotic tissue and allowed drainage with no pus noted. Instructed if redness were to persist or any issues to let me now

## 2014-10-29 ENCOUNTER — Other Ambulatory Visit: Payer: Self-pay

## 2014-10-29 DIAGNOSIS — Z1231 Encounter for screening mammogram for malignant neoplasm of breast: Secondary | ICD-10-CM

## 2014-11-23 ENCOUNTER — Ambulatory Visit
Admission: RE | Admit: 2014-11-23 | Discharge: 2014-11-23 | Disposition: A | Discharge status: Home / Self Care | Payer: 59 | Source: Ambulatory Visit

## 2014-11-23 DIAGNOSIS — Z1231 Encounter for screening mammogram for malignant neoplasm of breast: Secondary | ICD-10-CM

## 2014-11-26 ENCOUNTER — Encounter: Payer: Self-pay | Admitting: Women's Health

## 2014-12-11 IMAGING — CR DG HAND COMPLETE 3+V*R*
3 series · 3 of 3 positions shown · non-contrast
Comparison: None.

CLINICAL DATA: Dog bite

RIGHT HAND - COMPLETE 3+ VIEW

[x hand pa right]
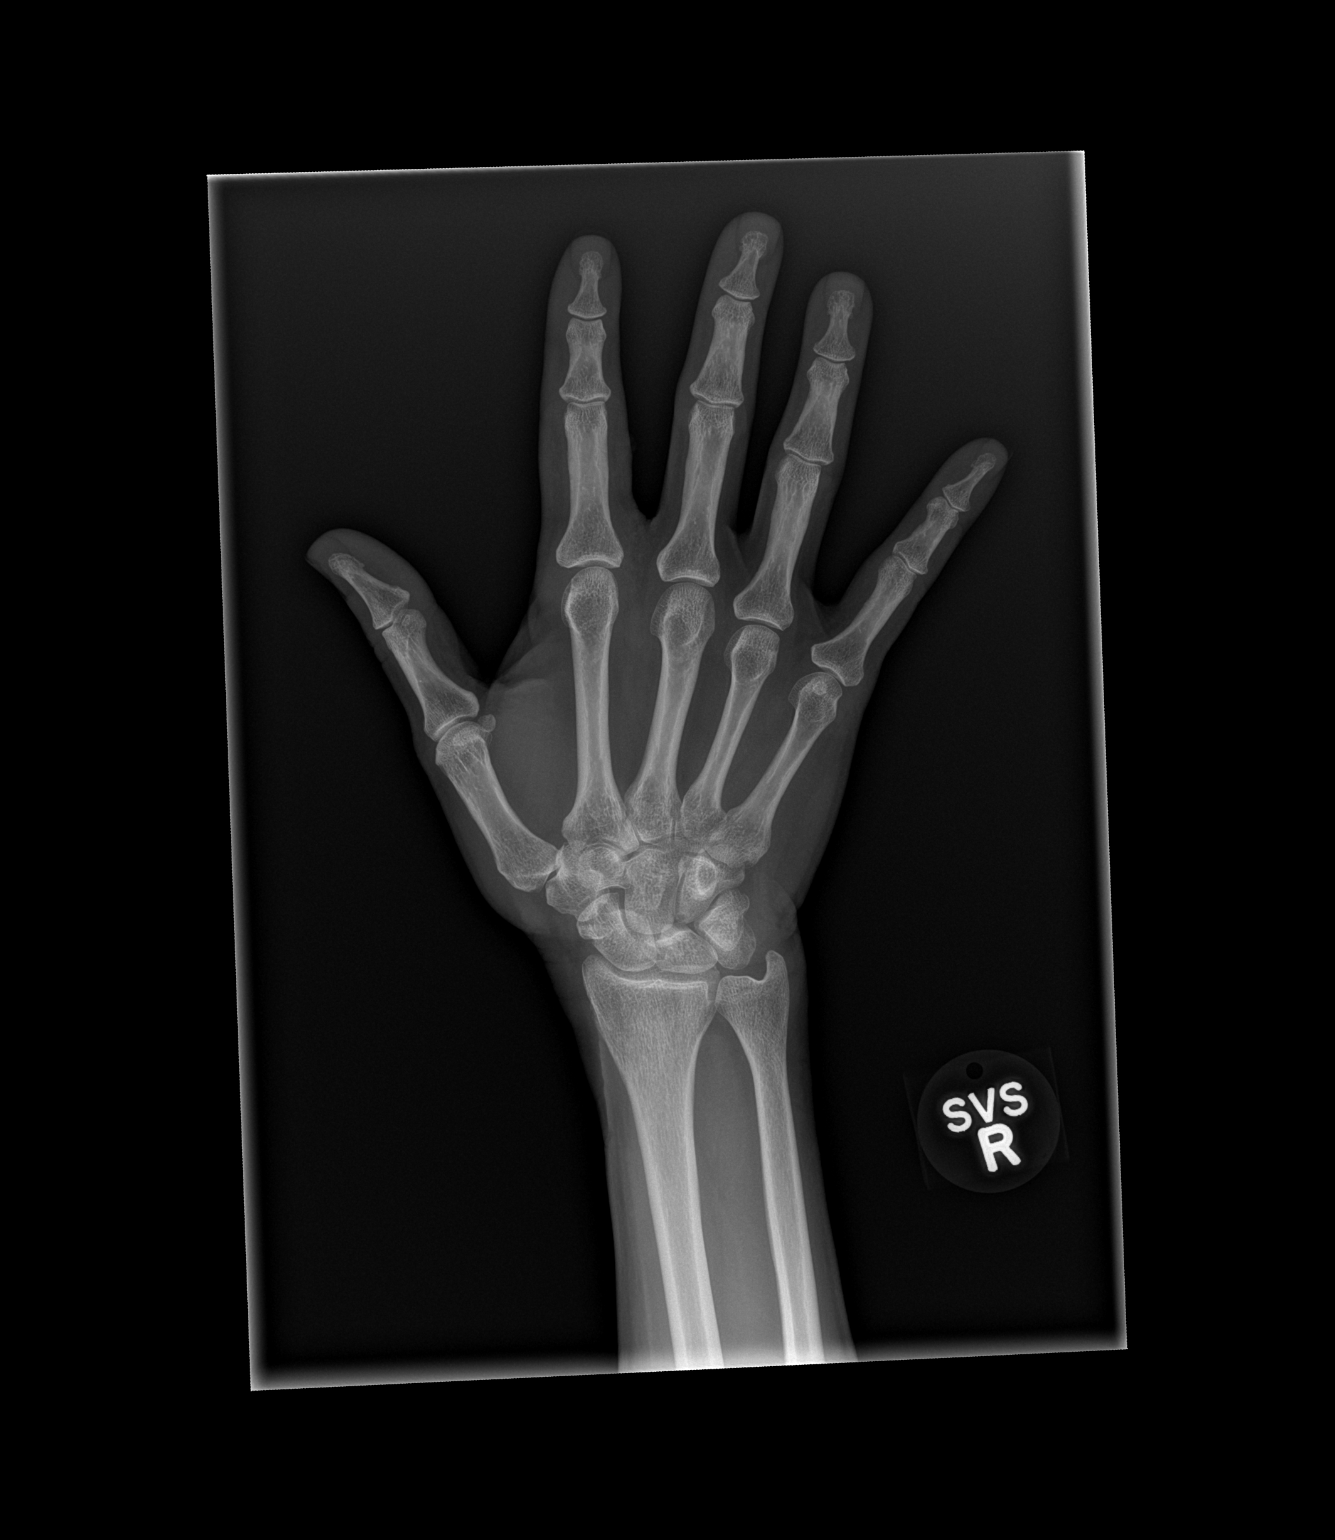

[x hand obl right]
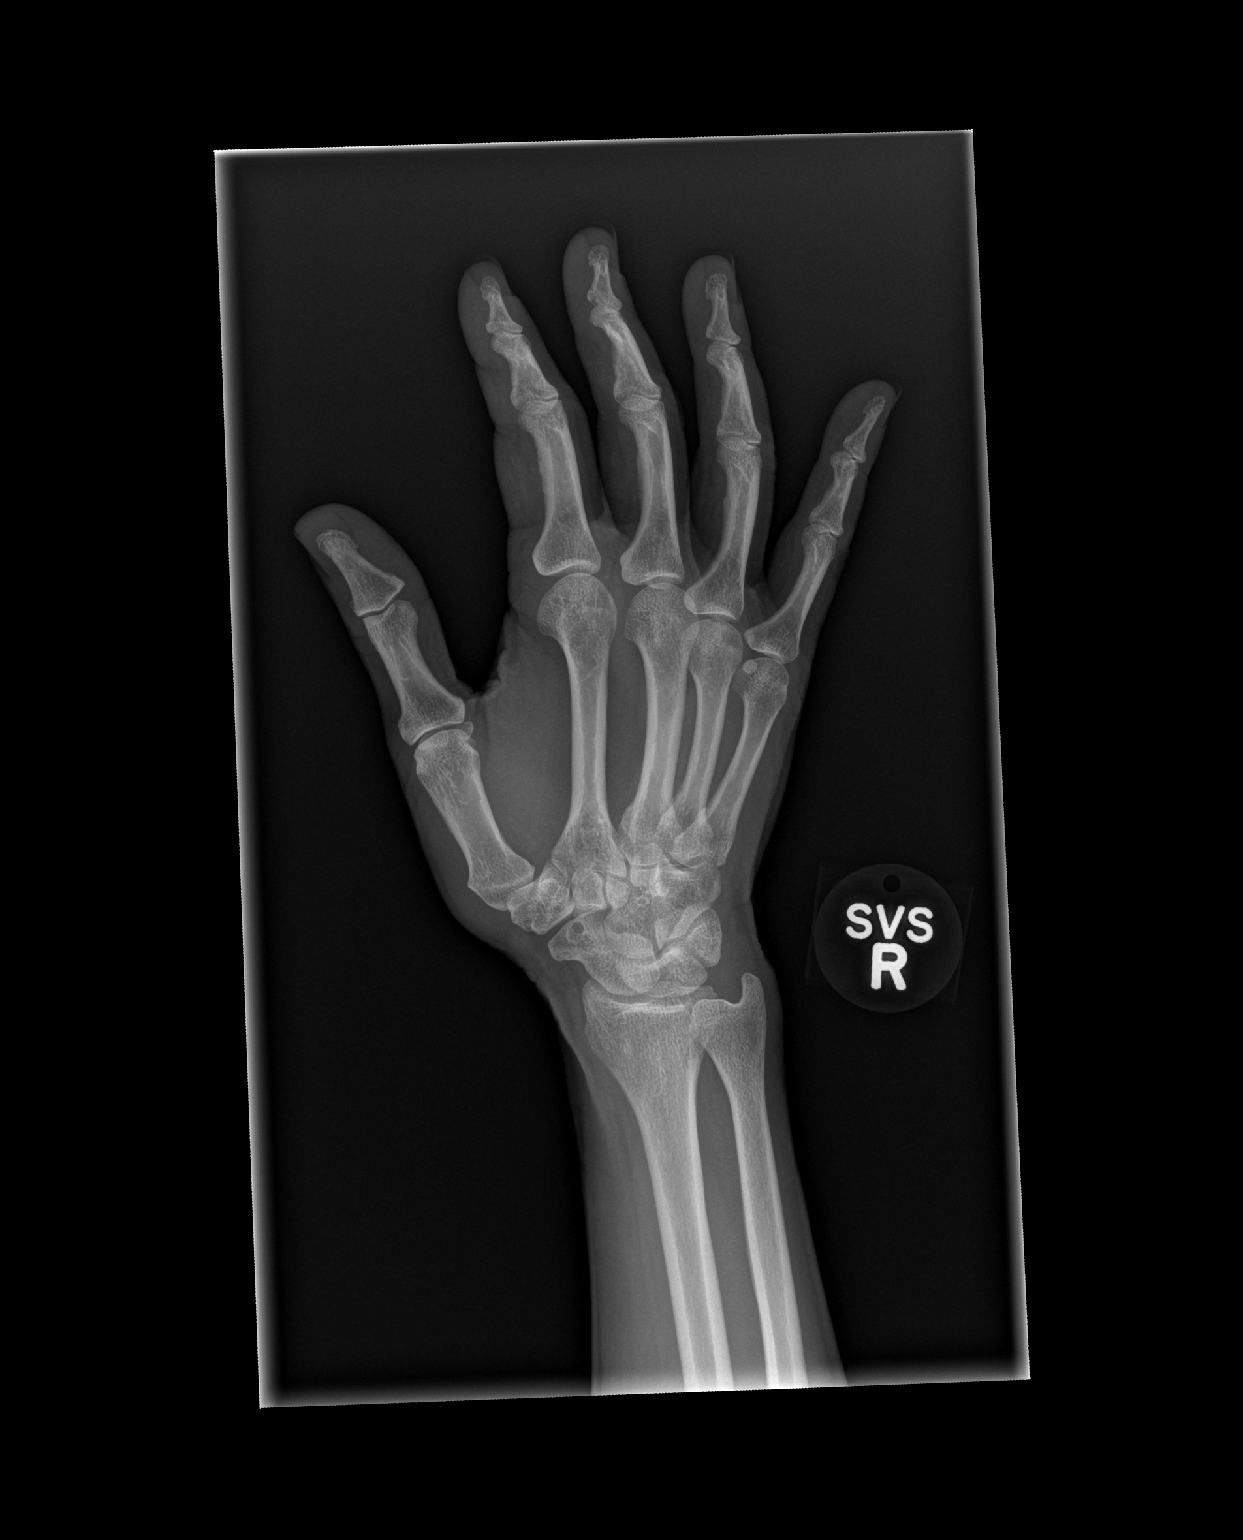

[x hand lat right]
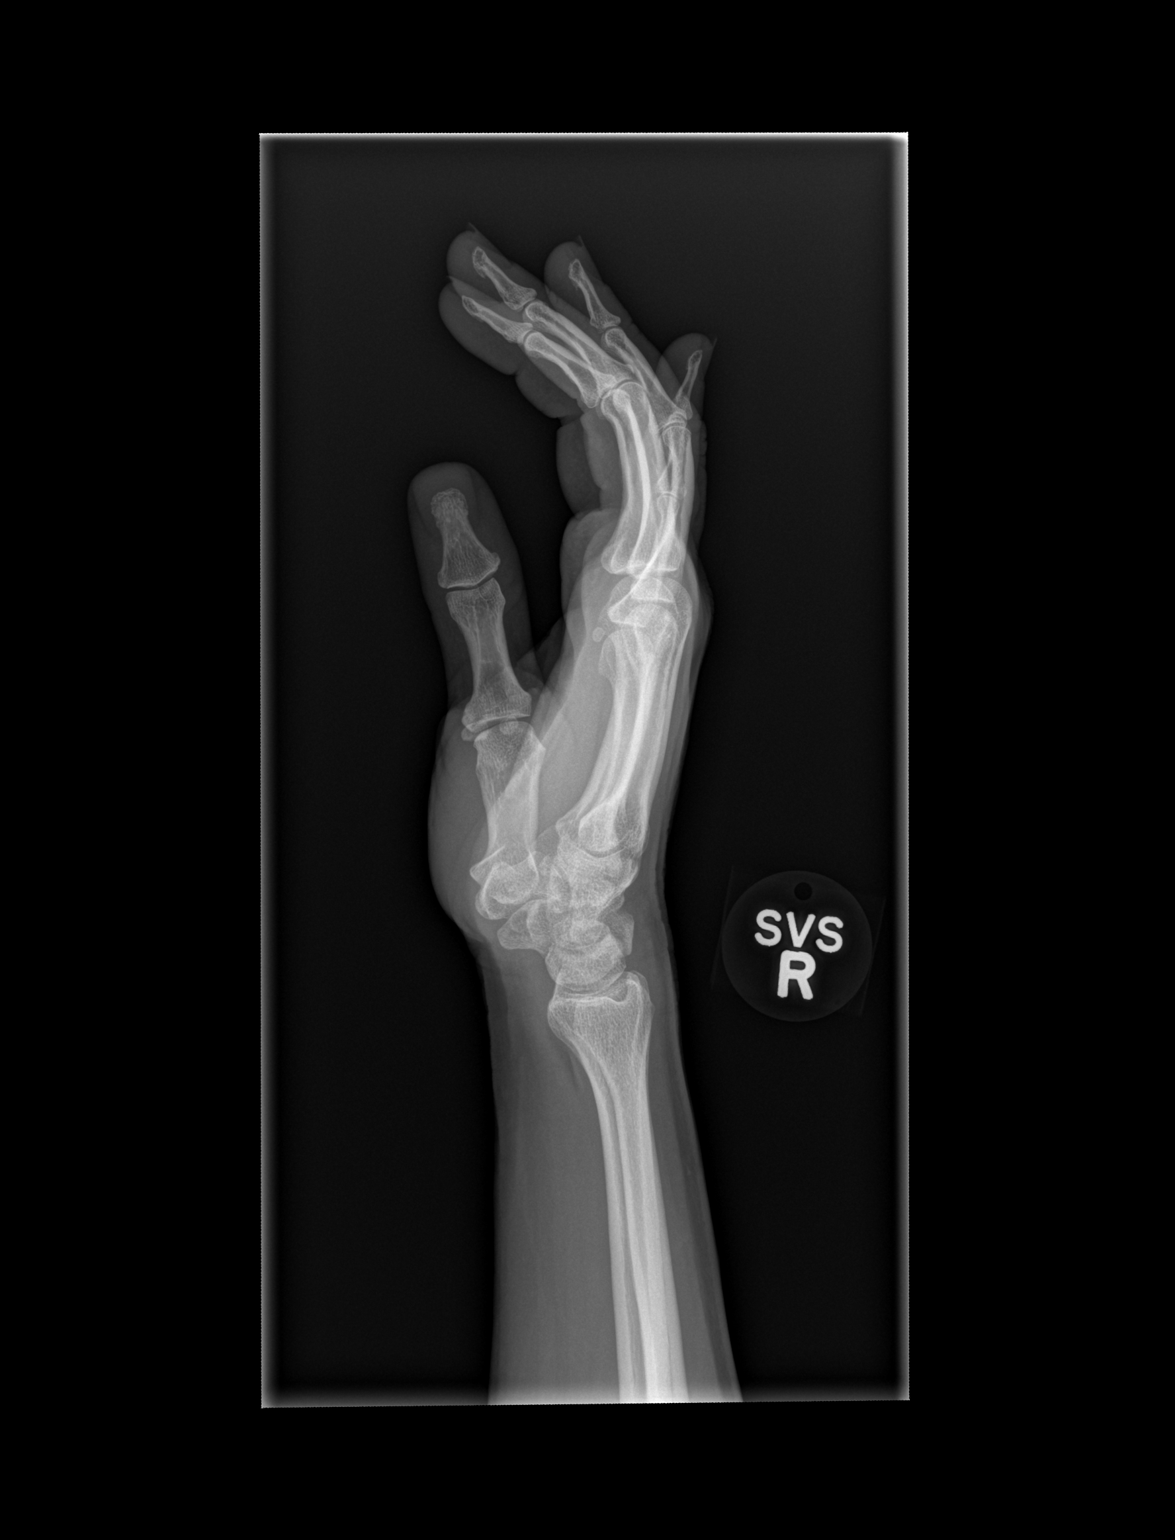

[3 of 3 positions shown; findings below may reference images not displayed]

FINDINGS: No acute fracture and no dislocation.
IMPRESSION: Acute bony pathology.

## 2015-01-11 ENCOUNTER — Encounter: Payer: 59 | Admitting: Women's Health

## 2015-01-17 ENCOUNTER — Encounter: Payer: 59 | Admitting: Women's Health

## 2015-02-06 ENCOUNTER — Ambulatory Visit (INDEPENDENT_AMBULATORY_CARE_PROVIDER_SITE_OTHER): Payer: 59 | Admitting: Women's Health

## 2015-02-06 ENCOUNTER — Encounter: Payer: Self-pay | Admitting: Women's Health

## 2015-02-06 ENCOUNTER — Encounter: Payer: 59 | Admitting: Women's Health

## 2015-02-06 VITALS — BP 122/82 | Ht 65.0 in | Wt 123.8 lb

## 2015-02-06 DIAGNOSIS — Z79890 Hormone replacement therapy (postmenopausal): Secondary | ICD-10-CM | POA: Diagnosis not present

## 2015-02-06 DIAGNOSIS — Z01419 Encounter for gynecological examination (general) (routine) without abnormal findings: Secondary | ICD-10-CM | POA: Diagnosis not present

## 2015-02-06 DIAGNOSIS — N952 Postmenopausal atrophic vaginitis: Secondary | ICD-10-CM

## 2015-02-06 DIAGNOSIS — M8580 Other specified disorders of bone density and structure, unspecified site: Secondary | ICD-10-CM

## 2015-02-06 MED ORDER — ESTRADIOL 0.5 MG PO TABS: 0.5000 mg | ORAL_TABLET | Freq: Every day | ORAL | Status: DC

## 2015-02-06 MED ORDER — ESTRADIOL 10 MCG VA TABS: 10.0000 ug | ORAL_TABLET | VAGINAL | Status: DC

## 2015-02-06 NOTE — Progress Notes (Signed)
Kelly Myers July 29, 1955 [id number]    History:    Presents for annual exam.  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 2015 T score -1.1 right femoral neck FRAX 6.4/0.4%, stable from previous DEXA. Normal Pap and mammogram history. Negative colonoscopy at 28. Hypertension, hypothyroid-primary care manages.   Past medical history, past surgical history, family history and social history were all reviewed and documented in the EPIC chart. Professional dog walker. Mother, father, sister hypertension. Sister with heart disease. 2 daughters both well.  ROS:  A ROS was performed and pertinent positives and negatives are included.  Exam:  Filed Vitals:   02/06/15 1505  BP: 122/82    General appearance:  Normal Thyroid:  Symmetrical, normal in size, without palpable masses or nodularity. Respiratory  Auscultation:  Clear without wheezing or rhonchi Cardiovascular  Auscultation:  Regular rate, without rubs, murmurs or gallops  Edema/varicosities:  Not grossly evident Abdominal  Soft,nontender, without masses, guarding or rebound.  Liver/spleen:  No organomegaly noted  Hernia:  None appreciated  Skin  Inspection:  Grossly normal   Breasts: Examined lying and sitting.     Right: Without masses, retractions, discharge or axillary adenopathy.     Left: Without masses, retractions, discharge or axillary adenopathy. Gentitourinary   Inguinal/mons:  Normal without inguinal adenopathy  External genitalia:  Normal  BUS/Urethra/Skene's glands:  Normal  Vagina:  Normal  Cervix:  And uterus absent  Adnexa/parametria:     Rt: Without masses or tenderness.   Lt: Without masses or tenderness.  Anus and perineum: Normal  Digital rectal exam: Normal sphincter tone without palpated masses or tenderness  Assessment/Plan:  59 y.o. MWF G2 P2 for annual exam with no complaints.  TVH with A&P repair on estradiol and Vagifem Osteopenia without elevated  FRAX Hypertension/hypothyroidism-primary care manages labs and meds  Plan: HRT reviewed risks of blood clots, strokes, breast cancer, will try estradiol 0.5 and decrease to half tablet prescription, proper use given and reviewed. Vagifem twice weekly vaginally prescription, proper use given states has had good relief of vaginal dryness. Reviewed importance of continuing healthy lifestyle of daily walking, calcium rich diet, vitamin D 2000 daily encouraged. Home safety, fall prevention discussed. Repeat colonoscopy this year. Zostavax recommended.  Huel Cote [facility], 3:58 PM 02/06/2015

## 2015-02-06 NOTE — Patient Instructions (Signed)

## 2015-08-30 ENCOUNTER — Other Ambulatory Visit: Payer: Self-pay | Admitting: Gastroenterology

## 2015-10-30 ENCOUNTER — Encounter (HOSPITAL_COMMUNITY): Payer: Self-pay | Admitting: *Deleted

## 2015-11-03 NOTE — Anesthesia Preprocedure Evaluation (Addendum)
Anesthesia Evaluation  Patient identified by MRN, date of birth, ID band Patient awake    Reviewed: Allergy & Precautions, H&P , NPO status , Patient's Chart, lab work & pertinent test results  Airway Mallampati: II  TM Distance: >3 FB Neck ROM: Full    Dental no notable dental hx. (+) Teeth Intact, Dental Advisory Given   Pulmonary asthma , former smoker,    Pulmonary exam normal breath sounds clear to auscultation       Cardiovascular hypertension, Pt. on medications  Rhythm:Regular Rate:Normal     Neuro/Psych negative neurological ROS  negative psych ROS   GI/Hepatic negative GI ROS, Neg liver ROS,   Endo/Other  negative endocrine ROSHypothyroidism   Renal/GU negative Renal ROS  negative genitourinary   Musculoskeletal   Abdominal   Peds  Hematology negative hematology ROS (+)   Anesthesia Other Findings   Reproductive/Obstetrics negative OB ROS                            Anesthesia Physical Anesthesia Plan  ASA: II  Anesthesia Plan: MAC   Post-op Pain Management:    Induction: Intravenous  Airway Management Planned: Simple Face Mask  Additional Equipment:   Intra-op Plan:   Post-operative Plan:   Informed Consent: I have reviewed the patients History and Physical, chart, labs and discussed the procedure including the risks, benefits and alternatives for the proposed anesthesia with the patient or authorized representative who has indicated his/her understanding and acceptance.   Dental advisory given  Plan Discussed with: CRNA  Anesthesia Plan Comments:         Anesthesia Quick Evaluation

## 2015-11-04 ENCOUNTER — Ambulatory Visit (HOSPITAL_COMMUNITY): Payer: 59 | Admitting: Anesthesiology

## 2015-11-04 ENCOUNTER — Encounter (HOSPITAL_COMMUNITY): Payer: Self-pay | Admitting: *Deleted

## 2015-11-04 ENCOUNTER — Ambulatory Visit (HOSPITAL_COMMUNITY)
Admission: RE | Admit: 2015-11-04 | Discharge: 2015-11-04 | Disposition: A | Discharge status: Home / Self Care | Payer: 59 | Source: Ambulatory Visit | Attending: Gastroenterology | Admitting: Gastroenterology

## 2015-11-04 ENCOUNTER — Encounter (HOSPITAL_COMMUNITY)
Admission: RE | Disposition: A | Discharge status: Home / Self Care | Payer: Self-pay | Source: Ambulatory Visit | Attending: Gastroenterology

## 2015-11-04 DIAGNOSIS — E039 Hypothyroidism, unspecified: Secondary | ICD-10-CM | POA: Insufficient documentation

## 2015-11-04 DIAGNOSIS — Z87891 Personal history of nicotine dependence: Secondary | ICD-10-CM | POA: Insufficient documentation

## 2015-11-04 DIAGNOSIS — Z1211 Encounter for screening for malignant neoplasm of colon: Secondary | ICD-10-CM | POA: Diagnosis not present

## 2015-11-04 DIAGNOSIS — D120 Benign neoplasm of cecum: Secondary | ICD-10-CM | POA: Diagnosis not present

## 2015-11-04 DIAGNOSIS — Z79899 Other long term (current) drug therapy: Secondary | ICD-10-CM | POA: Insufficient documentation

## 2015-11-04 DIAGNOSIS — J45909 Unspecified asthma, uncomplicated: Secondary | ICD-10-CM | POA: Diagnosis not present

## 2015-11-04 DIAGNOSIS — I10 Essential (primary) hypertension: Secondary | ICD-10-CM | POA: Diagnosis not present

## 2015-11-04 HISTORY — DX: Unspecified asthma, uncomplicated: J45.909

## 2015-11-04 HISTORY — DX: Hypothyroidism, unspecified: E03.9

## 2015-11-04 HISTORY — PX: COLONOSCOPY WITH PROPOFOL: SHX5780

## 2015-11-04 SURGERY — COLONOSCOPY WITH PROPOFOL
Anesthesia: Monitor Anesthesia Care

## 2015-11-04 MED ORDER — SODIUM CHLORIDE 0.9 % IV SOLN: INTRAVENOUS | Status: DC

## 2015-11-04 MED ORDER — PROPOFOL 500 MG/50ML IV EMUL
INTRAVENOUS | Status: DC | PRN
  Administered 2015-11-04: 150 ug/kg/min via INTRAVENOUS

## 2015-11-04 MED ORDER — LACTATED RINGERS IV SOLN
INTRAVENOUS | Status: DC
  Administered 2015-11-04: 1000 mL via INTRAVENOUS

## 2015-11-04 MED ORDER — PROPOFOL 10 MG/ML IV BOLUS
INTRAVENOUS | Status: AC
  Filled 2015-11-04: qty 40

## 2015-11-04 MED ORDER — LACTATED RINGERS IV SOLN
INTRAVENOUS | Status: DC | PRN
  Administered 2015-11-04: 07:00:00 via INTRAVENOUS

## 2015-11-04 SURGICAL SUPPLY — 21 items

## 2015-11-04 NOTE — Transfer of Care (Signed)
Immediate Anesthesia Transfer of Care Note  Patient: Kelly Myers  Procedure(s) Performed: Procedure(s): COLONOSCOPY WITH PROPOFOL (N/A)  Patient Location: PACU  Anesthesia Type:MAC  Level of Consciousness: awake, alert , oriented and patient cooperative  Airway & Oxygen Therapy: Patient Spontanous Breathing and Patient connected to face mask oxygen  Post-op Assessment: Report given to RN, Post -op Vital signs reviewed and stable and Patient moving all extremities X 4  Post vital signs: stable  Last Vitals:  Vitals:   11/04/15 0726  BP: (!) 165/86  Pulse: 80  Resp: 13  Temp: 36.7 C    Last Pain:  Vitals:   11/04/15 0726  TempSrc: Oral         Complications: No apparent anesthesia complications

## 2015-11-04 NOTE — Anesthesia Postprocedure Evaluation (Signed)
Anesthesia Post Note  Patient: Kelly Myers  Procedure(s) Performed: Procedure(s) (LRB): COLONOSCOPY WITH PROPOFOL (N/A)  Patient location during evaluation: PACU Anesthesia Type: MAC Level of consciousness: awake and alert Pain management: pain level controlled Vital Signs Assessment: post-procedure vital signs reviewed and stable Respiratory status: spontaneous breathing, nonlabored ventilation and respiratory function stable Cardiovascular status: stable and blood pressure returned to baseline Anesthetic complications: no    Last Vitals:  Vitals:   11/04/15 0950 11/04/15 1000  BP: (!) 145/95 (!) 155/78  Pulse: 60 (!) 55  Resp: 19 (!) 24  Temp:      Last Pain:  Vitals:   11/04/15 0924  TempSrc: Oral                 Kiki Bivens,W. EDMOND

## 2015-11-04 NOTE — Op Note (Signed)
[name] Patient Name: Kelly Myers Procedure Date: 11/04/2015 MRN: [id number] Attending MD: Garlan Fair , MD Date of Birth: April 09, 1956 CSN: [id number] Age: 60 Admit Type: Outpatient Procedure:                Colonoscopy Indications:              Screening for colorectal malignant neoplasm Providers:                Garlan Fair, MD, Hilma Favors, RN, Alfonso Patten, Technician, Enrigue Catena, CRNA Referring MD:              Medicines:                Propofol per Anesthesia Complications:            No immediate complications. Estimated Blood Loss:     Estimated blood loss: none. Procedure:                Pre-Anesthesia Assessment:                           - Prior to the procedure, a History and Physical                            was performed, and patient medications and                            allergies were reviewed. The patient's tolerance of                            previous anesthesia was also reviewed. The risks                            and benefits of the procedure and the sedation                            options and risks were discussed with the patient.                            All questions were answered, and informed consent                            was obtained. Prior Anticoagulants: The patient has                            taken no previous anticoagulant or antiplatelet                            agents. ASA Grade Assessment: II - A patient with                            mild systemic disease. After reviewing the risks  and benefits, the patient was deemed in                            satisfactory condition to undergo the procedure.                           After obtaining informed consent, the colonoscope                            was passed under direct vision. Throughout the                            procedure, the patient's blood pressure, pulse, and          oxygen saturations were monitored continuously. The                            [id number] scope was introduced through                            the anus and advanced to the the cecum, identified                            by appendiceal orifice and ileocecal valve. The                            colonoscopy was performed without difficulty. The                            patient tolerated the procedure well. The quality                            of the bowel preparation was good. The ileocecal                            valve, the appendiceal orifice and the rectum were                            photographed. Scope In: 8:52:55 AM Scope Out: 9:16:22 AM Scope Withdrawal Time: 0 hours 11 minutes 54 seconds  Total Procedure Duration: 0 hours 23 minutes 27 seconds  Findings:      The perianal and digital rectal examinations were normal.      The entire examined colon appeared normal except for the removal of two       diminuitive cecal polyps with the cold biopsy forceps. Impression:               - The entire examined colon is normal.                           - No specimens collected. Moderate Sedation:      N/A- Per Anesthesia Care Recommendation:           - Patient has a contact number available for  emergencies. The signs and symptoms of potential                            delayed complications were discussed with the                            patient. Return to normal activities tomorrow.                            Written discharge instructions were provided to the                            patient.                           - Repeat colonoscopy date to be determined after                            pending pathology results are reviewed for                            screening purposes.                           - Resume previous diet.                           - Continue present medications. Procedure Code(s):        ---  Professional ---                           [phone], Colonoscopy, flexible; diagnostic, including                            collection of specimen(s) by brushing or washing,                            when performed (separate procedure) Diagnosis Code(s):        --- Professional ---                           Z12.11, Encounter for screening for malignant                            neoplasm of colon CPT copyright 2016 [employer]. All rights reserved. The codes documented in this report are preliminary and upon coder review may  be revised to meet current compliance requirements. Earle Gell, MD Garlan Fair, MD 11/04/2015 9:22:51 AM This report has been signed electronically. Number of Addenda: 0

## 2015-11-04 NOTE — H&P (Signed)
  Procedure: Screening colonoscopy. Normal screening colonoscopy was performed on 04/23/2005  History: The patient is a 60 year old female born [birth year]. She is scheduled to undergo a repeat screening colonoscopy today.  Past medical history: Allergic rhinitis. Reactive airway disease. Hypertension. Acne rosacea. Thyroid nodule. Astigmatism. Hypercholesterolemia. Hysterectomy. Tubal ligation. Tonsillectomy.  Medication allergies: Macrodantin and Neosporin  Exam: Patient is alert and lying comfortably on the endoscopy stretcher. Abdomen is soft and nontender to palpation. Lungs are clear to auscultation. Cardiac exam reveals a regular rhythm.  Plan: Proceed with screening colonoscopy

## 2015-11-04 NOTE — Discharge Instructions (Signed)

## 2015-11-05 ENCOUNTER — Encounter (HOSPITAL_COMMUNITY): Payer: Self-pay | Admitting: Gastroenterology

## 2015-11-18 ENCOUNTER — Other Ambulatory Visit: Payer: Self-pay | Admitting: Family Medicine

## 2015-11-18 DIAGNOSIS — Z1231 Encounter for screening mammogram for malignant neoplasm of breast: Secondary | ICD-10-CM

## 2015-11-28 ENCOUNTER — Ambulatory Visit: Payer: 59

## 2015-12-19 ENCOUNTER — Ambulatory Visit
Admission: RE | Admit: 2015-12-19 | Discharge: 2015-12-19 | Disposition: A | Discharge status: Home / Self Care | Payer: Managed Care, Other (non HMO) | Source: Ambulatory Visit | Attending: Family Medicine | Admitting: Family Medicine

## 2015-12-19 DIAGNOSIS — Z1231 Encounter for screening mammogram for malignant neoplasm of breast: Secondary | ICD-10-CM

## 2015-12-20 ENCOUNTER — Encounter: Payer: Self-pay | Admitting: Women's Health

## 2015-12-30 DIAGNOSIS — M899 Disorder of bone, unspecified: Secondary | ICD-10-CM | POA: Insufficient documentation

## 2015-12-30 DIAGNOSIS — D126 Benign neoplasm of colon, unspecified: Secondary | ICD-10-CM | POA: Insufficient documentation

## 2016-02-11 ENCOUNTER — Encounter: Payer: Self-pay | Admitting: Women's Health

## 2016-02-11 ENCOUNTER — Ambulatory Visit (INDEPENDENT_AMBULATORY_CARE_PROVIDER_SITE_OTHER): Payer: Managed Care, Other (non HMO) | Admitting: Women's Health

## 2016-02-11 VITALS — BP 120/82 | Ht 65.0 in | Wt 127.0 lb

## 2016-02-11 DIAGNOSIS — Z79890 Hormone replacement therapy (postmenopausal): Secondary | ICD-10-CM | POA: Diagnosis not present

## 2016-02-11 DIAGNOSIS — M8580 Other specified disorders of bone density and structure, unspecified site: Secondary | ICD-10-CM | POA: Diagnosis not present

## 2016-02-11 DIAGNOSIS — Z01419 Encounter for gynecological examination (general) (routine) without abnormal findings: Secondary | ICD-10-CM

## 2016-02-11 DIAGNOSIS — N952 Postmenopausal atrophic vaginitis: Secondary | ICD-10-CM | POA: Diagnosis not present

## 2016-02-11 MED ORDER — ESTRADIOL 0.5 MG PO TABS
0.5000 mg | ORAL_TABLET | Freq: Every day | ORAL | 4 refills | Status: DC
Start: 2016-02-11 — End: 2017-02-23

## 2016-02-11 MED ORDER — ESTRADIOL 10 MCG VA TABS: 10.0000 ug | ORAL_TABLET | VAGINAL | 4 refills | Status: DC

## 2016-02-11 NOTE — Patient Instructions (Signed)

## 2016-02-11 NOTE — Progress Notes (Signed)
Kelly Myers 1955-10-21 [id number]    History:    Presents for annual exam.  2008 TVH and A&P repair on estradiol 0.5 daily with continued hot flushes and Vagifem twice weekly. 2015 T score -1.1 FRAX 6.4%/0.4% stable. Hypertension primary care manages, hypothyroidism endocrinologist. 10/2015 tubular adenoma noted on colonoscopy 5 year recall. Current on vaccines.  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 Mother, father, sister hypertension.  ROS:  A ROS was performed and pertinent positives and negatives are included.  Exam:  Vitals:   02/11/16 0931  BP: 120/82  Weight: 127 lb (57.6 kg)  Height: 5\' 5"  (1.651 m)   Body mass index is 21.13 kg/m.   General appearance:  Normal Thyroid:  Symmetrical, normal in size, without palpable masses or nodularity. Respiratory  Auscultation:  Clear without wheezing or rhonchi Cardiovascular  Auscultation:  Regular rate, without rubs, murmurs or gallops  Edema/varicosities:  Not grossly evident Abdominal  Soft,nontender, without masses, guarding or rebound.  Liver/spleen:  No organomegaly noted  Hernia:  None appreciated  Skin  Inspection:  Grossly normal   Breasts: Examined lying and sitting.     Right: Without masses, retractions, discharge or axillary adenopathy.     Left: Without masses, retractions, discharge or axillary adenopathy. Gentitourinary   Inguinal/mons:  Normal without inguinal adenopathy  External genitalia:  Normal  BUS/Urethra/Skene's glands:  Normal  Vagina:  Normal  Cervix:  And uterus absent  Adnexa/parametria:     Rt: Without masses or tenderness.   Lt: Without masses or tenderness.  Anus and perineum: Normal  Digital rectal exam: Normal sphincter tone without palpated masses or tenderness  Assessment/Plan:  60 y.o. MWF G2 P2 for annual exam with no complaints.  TVH  and A&P repair on estradiol and Vagifem Osteopenia without elevated FRAX Hypertension-primary care manages labs and meds Hypothyroidism-endocrinologist manages  Plan: HRT reviewed viewed best to be on shortest amount of time least amount, continues with hot flushes wishes to continue estradiol 0.5 prescription, proper use given and reviewed. Vagifem twice weekly prescription given. Repeat DEXA, home safety, fall prevention and importance of weightbearing exercise reviewed. Continue yoga and active lifestyle. Calcium rich diet, vitamin D 1000 daily encouraged. Teresa Coombs [facility], 10:42 AM 02/11/2016

## 2016-02-12 ENCOUNTER — Emergency Department (HOSPITAL_COMMUNITY): Payer: Managed Care, Other (non HMO)

## 2016-02-12 ENCOUNTER — Encounter (HOSPITAL_COMMUNITY): Payer: Self-pay | Admitting: Emergency Medicine

## 2016-02-12 ENCOUNTER — Observation Stay (HOSPITAL_COMMUNITY)
Admission: EM | Admit: 2016-02-12 | Discharge: 2016-02-14 | Disposition: A | Discharge status: Home / Self Care | Payer: Managed Care, Other (non HMO) | Attending: Internal Medicine | Admitting: Internal Medicine

## 2016-02-12 DIAGNOSIS — R740 Nonspecific elevation of levels of transaminase and lactic acid dehydrogenase [LDH]: Secondary | ICD-10-CM

## 2016-02-12 DIAGNOSIS — I10 Essential (primary) hypertension: Secondary | ICD-10-CM | POA: Diagnosis not present

## 2016-02-12 DIAGNOSIS — R9431 Abnormal electrocardiogram [ECG] [EKG]: Secondary | ICD-10-CM | POA: Insufficient documentation

## 2016-02-12 DIAGNOSIS — R749 Abnormal serum enzyme level, unspecified: Secondary | ICD-10-CM | POA: Insufficient documentation

## 2016-02-12 DIAGNOSIS — R000 Tachycardia, unspecified: Secondary | ICD-10-CM | POA: Diagnosis not present

## 2016-02-12 DIAGNOSIS — R7989 Other specified abnormal findings of blood chemistry: Secondary | ICD-10-CM

## 2016-02-12 DIAGNOSIS — R0600 Dyspnea, unspecified: Secondary | ICD-10-CM | POA: Diagnosis not present

## 2016-02-12 DIAGNOSIS — R7401 Elevation of levels of liver transaminase levels: Secondary | ICD-10-CM | POA: Diagnosis present

## 2016-02-12 DIAGNOSIS — J45909 Unspecified asthma, uncomplicated: Secondary | ICD-10-CM | POA: Diagnosis not present

## 2016-02-12 DIAGNOSIS — E039 Hypothyroidism, unspecified: Secondary | ICD-10-CM | POA: Insufficient documentation

## 2016-02-12 DIAGNOSIS — R5381 Other malaise: Secondary | ICD-10-CM

## 2016-02-12 DIAGNOSIS — Z87891 Personal history of nicotine dependence: Secondary | ICD-10-CM | POA: Diagnosis not present

## 2016-02-12 DIAGNOSIS — Z79899 Other long term (current) drug therapy: Secondary | ICD-10-CM | POA: Insufficient documentation

## 2016-02-12 DIAGNOSIS — R748 Abnormal levels of other serum enzymes: Secondary | ICD-10-CM | POA: Diagnosis not present

## 2016-02-12 DIAGNOSIS — Z5181 Encounter for therapeutic drug level monitoring: Secondary | ICD-10-CM | POA: Insufficient documentation

## 2016-02-12 DIAGNOSIS — R5383 Other fatigue: Secondary | ICD-10-CM | POA: Diagnosis not present

## 2016-02-12 DIAGNOSIS — R778 Other specified abnormalities of plasma proteins: Secondary | ICD-10-CM | POA: Diagnosis present

## 2016-02-12 DIAGNOSIS — R700 Elevated erythrocyte sedimentation rate: Secondary | ICD-10-CM | POA: Diagnosis present

## 2016-02-12 DIAGNOSIS — R21 Rash and other nonspecific skin eruption: Secondary | ICD-10-CM

## 2016-02-12 DIAGNOSIS — R509 Fever, unspecified: Secondary | ICD-10-CM | POA: Diagnosis present

## 2016-02-12 HISTORY — DX: Nontoxic single thyroid nodule: E04.1

## 2016-02-12 HISTORY — DX: Personal history of other medical treatment: Z92.89

## 2016-02-12 HISTORY — DX: Hemorrhagic condition, unspecified: D69.9

## 2016-02-12 HISTORY — DX: Pneumonia, unspecified organism: J18.9

## 2016-02-12 LAB — COMPREHENSIVE METABOLIC PANEL
ALBUMIN: 3.4 g/dL — AB (ref 3.5–5.0)
ALK PHOS: 205 U/L — AB (ref 38–126)
ALT: 89 U/L — ABNORMAL HIGH (ref 14–54)
ANION GAP: 9 (ref 5–15)
AST: 51 U/L — ABNORMAL HIGH (ref 15–41)
BUN: <5 mg/dL — ABNORMAL LOW (ref 6–20)
CALCIUM: 9 mg/dL (ref 8.9–10.3)
CO2: 23 mmol/L (ref 22–32)
Chloride: 104 mmol/L (ref 101–111)
Creatinine, Ser: 0.67 mg/dL (ref 0.44–1.00)
GFR calc non Af Amer: >60 mL/min (ref >60)
GLUCOSE: 132 mg/dL — AB (ref 65–99)
POTASSIUM: 3.8 mmol/L (ref 3.5–5.1)
SODIUM: 136 mmol/L (ref 135–145)
Total Bilirubin: 1.7 mg/dL — ABNORMAL HIGH (ref 0.3–1.2)
Total Protein: 6.3 g/dL — ABNORMAL LOW (ref 6.5–8.1)

## 2016-02-12 LAB — CBC WITH DIFFERENTIAL/PLATELET
BASOS ABS: 0 10*3/uL (ref 0.0–0.1)
Basophils Relative: 0 %
EOS ABS: 0.4 10*3/uL (ref 0.0–0.7)
EOS PCT: 5 %
HCT: 36 % (ref 36.0–46.0)
HEMOGLOBIN: 12.2 g/dL (ref 12.0–15.0)
LYMPHS PCT: 8 %
Lymphs Abs: 0.6 10*3/uL — ABNORMAL LOW (ref 0.7–4.0)
MCH: 29.7 pg (ref 26.0–34.0)
MCHC: 33.9 g/dL (ref 30.0–36.0)
MCV: 87.6 fL (ref 78.0–100.0)
Monocytes Absolute: 0.8 10*3/uL (ref 0.1–1.0)
Monocytes Relative: 10 %
NEUTROS PCT: 77 %
Neutro Abs: 6.2 10*3/uL (ref 1.7–7.7)
PLATELETS: 152 10*3/uL (ref 150–400)
RBC: 4.11 MIL/uL (ref 3.87–5.11)
RDW: 13.2 % (ref 11.5–15.5)
WBC: 8 10*3/uL (ref 4.0–10.5)

## 2016-02-12 LAB — URINALYSIS W MICROSCOPIC + REFLEX CULTURE
BILIRUBIN URINE: NEGATIVE
Casts: NONE SEEN [LPF]
Crystals: NONE SEEN [HPF]
GLUCOSE, UA: NEGATIVE
HGB URINE DIPSTICK: NEGATIVE
Nitrite: NEGATIVE
RBC / HPF: NONE SEEN RBC/HPF (ref <=2)
Specific Gravity, Urine: 1.024 (ref 1.001–1.035)
YEAST: NONE SEEN [HPF]
pH: 5.5 (ref 5.0–8.0)

## 2016-02-12 LAB — RAPID URINE DRUG SCREEN, HOSP PERFORMED
AMPHETAMINES: NOT DETECTED
BARBITURATES: NOT DETECTED
Benzodiazepines: NOT DETECTED
Cocaine: NOT DETECTED
Opiates: NOT DETECTED
TETRAHYDROCANNABINOL: POSITIVE — AB

## 2016-02-12 LAB — PROTIME-INR
INR: 1.06
PROTHROMBIN TIME: 13.8 s (ref 11.4–15.2)

## 2016-02-12 LAB — URINALYSIS, ROUTINE W REFLEX MICROSCOPIC
BILIRUBIN URINE: NEGATIVE
Glucose, UA: NEGATIVE mg/dL
HGB URINE DIPSTICK: NEGATIVE
Ketones, ur: 40 mg/dL — AB
Leukocytes, UA: NEGATIVE
NITRITE: NEGATIVE
PROTEIN: NEGATIVE mg/dL
Specific Gravity, Urine: 1.006 (ref 1.005–1.030)
pH: 5.5 (ref 5.0–8.0)

## 2016-02-12 LAB — TSH: TSH: 0.261 u[IU]/mL — ABNORMAL LOW (ref 0.350–4.500)

## 2016-02-12 LAB — I-STAT CG4 LACTIC ACID, ED: LACTIC ACID, VENOUS: 0.83 mmol/L (ref 0.5–1.9)

## 2016-02-12 LAB — T4, FREE: Free T4: 1.2 ng/dL — ABNORMAL HIGH (ref 0.61–1.12)

## 2016-02-12 LAB — SEDIMENTATION RATE: SED RATE: 50 mm/h — AB (ref 0–22)

## 2016-02-12 LAB — BRAIN NATRIURETIC PEPTIDE: B Natriuretic Peptide: 263 pg/mL — ABNORMAL HIGH (ref 0.0–100.0)

## 2016-02-12 LAB — D-DIMER, QUANTITATIVE (NOT AT ARMC): D DIMER QUANT: 0.97 ug{FEU}/mL — AB (ref 0.00–0.50)

## 2016-02-12 LAB — TROPONIN I
TROPONIN I: 0.13 ng/mL — AB (ref <0.03)
TROPONIN I: 0.12 ng/mL — AB (ref <0.03)
Troponin I: 0.12 ng/mL (ref <0.03)

## 2016-02-12 MED ORDER — LISINOPRIL 10 MG PO TABS
10.0000 mg | ORAL_TABLET | Freq: Every day | ORAL | Status: DC
  Administered 2016-02-13 – 2016-02-14 (×2): 10 mg via ORAL
  Filled 2016-02-12 (×2): qty 1

## 2016-02-12 MED ORDER — SODIUM CHLORIDE 0.9 % IV BOLUS (SEPSIS)
1000.0000 mL | Freq: Once | INTRAVENOUS | Status: AC
  Administered 2016-02-12: 1000 mL via INTRAVENOUS

## 2016-02-12 MED ORDER — LISINOPRIL 10 MG PO TABS
10.0000 mg | ORAL_TABLET | Freq: Every day | ORAL | Status: DC
  Administered 2016-02-12: 10 mg via ORAL
  Filled 2016-02-12: qty 1

## 2016-02-12 MED ORDER — MINOCYCLINE HCL 50 MG PO CAPS: 50.0000 mg | ORAL_CAPSULE | Freq: Two times a day (BID) | ORAL | Status: DC

## 2016-02-12 MED ORDER — GLUCOSAMINE-CHONDROITIN 500-400 MG PO TABS
1.0000 | ORAL_TABLET | Freq: Every day | ORAL | Status: DC
Start: 2016-02-12 — End: 2016-02-12

## 2016-02-12 MED ORDER — ACETAMINOPHEN 325 MG PO TABS
650.0000 mg | ORAL_TABLET | Freq: Four times a day (QID) | ORAL | Status: DC | PRN
Start: 2016-02-12 — End: 2016-02-14
  Administered 2016-02-13 – 2016-02-14 (×2): 650 mg via ORAL
  Filled 2016-02-12 (×2): qty 2

## 2016-02-12 MED ORDER — SODIUM CHLORIDE 0.9 % IV SOLN
INTRAVENOUS | Status: AC
  Administered 2016-02-12: 20:00:00 via INTRAVENOUS

## 2016-02-12 MED ORDER — ENOXAPARIN SODIUM 40 MG/0.4ML ~~LOC~~ SOLN
40.0000 mg | SUBCUTANEOUS | Status: DC
  Administered 2016-02-12 – 2016-02-13 (×2): 40 mg via SUBCUTANEOUS
  Filled 2016-02-12 (×2): qty 0.4

## 2016-02-12 MED ORDER — SODIUM CHLORIDE 0.9% FLUSH
3.0000 mL | Freq: Two times a day (BID) | INTRAVENOUS | Status: DC
  Administered 2016-02-13 – 2016-02-14 (×2): 3 mL via INTRAVENOUS

## 2016-02-12 MED ORDER — ESTRADIOL 1 MG PO TABS
0.5000 mg | ORAL_TABLET | Freq: Every day | ORAL | Status: DC
  Administered 2016-02-13 – 2016-02-14 (×2): 0.5 mg via ORAL
  Filled 2016-02-12 (×2): qty 0.5

## 2016-02-12 MED ORDER — HYDROCODONE-ACETAMINOPHEN 5-325 MG PO TABS: 1.0000 | ORAL_TABLET | ORAL | Status: DC | PRN

## 2016-02-12 MED ORDER — IOPAMIDOL (ISOVUE-370) INJECTION 76%
INTRAVENOUS | Status: AC
  Administered 2016-02-12: 100 mL
  Filled 2016-02-12: qty 100

## 2016-02-12 MED ORDER — SODIUM CHLORIDE 0.9 % IV SOLN: INTRAVENOUS | Status: DC

## 2016-02-12 MED ORDER — CALCIUM CITRATE-VITAMIN D 315-200 MG-UNIT PO TABS: 1.0000 | ORAL_TABLET | Freq: Every day | ORAL | Status: DC

## 2016-02-12 MED ORDER — ONDANSETRON HCL 4 MG PO TABS: 4.0000 mg | ORAL_TABLET | Freq: Four times a day (QID) | ORAL | Status: DC | PRN

## 2016-02-12 MED ORDER — CALCIUM CARBONATE-VITAMIN D 500-200 MG-UNIT PO TABS
1.0000 | ORAL_TABLET | Freq: Every day | ORAL | Status: DC
  Administered 2016-02-13 – 2016-02-14 (×2): 1 via ORAL
  Filled 2016-02-12 (×2): qty 1

## 2016-02-12 MED ORDER — METOPROLOL TARTRATE 25 MG PO TABS
25.0000 mg | ORAL_TABLET | Freq: Three times a day (TID) | ORAL | Status: DC
  Administered 2016-02-12 – 2016-02-14 (×5): 25 mg via ORAL
  Filled 2016-02-12 (×5): qty 1

## 2016-02-12 MED ORDER — LORATADINE 10 MG PO TABS
10.0000 mg | ORAL_TABLET | Freq: Every day | ORAL | Status: DC
  Administered 2016-02-12 – 2016-02-14 (×3): 10 mg via ORAL
  Filled 2016-02-12 (×3): qty 1

## 2016-02-12 MED ORDER — MONTELUKAST SODIUM 10 MG PO TABS
10.0000 mg | ORAL_TABLET | Freq: Every day | ORAL | Status: DC
  Administered 2016-02-12 – 2016-02-13 (×2): 10 mg via ORAL
  Filled 2016-02-12 (×2): qty 1

## 2016-02-12 MED ORDER — FLUTICASONE PROPIONATE 50 MCG/ACT NA SUSP
2.0000 | Freq: Every day | NASAL | Status: DC
  Administered 2016-02-13 – 2016-02-14 (×2): 2 via NASAL
  Filled 2016-02-12: qty 16

## 2016-02-12 MED ORDER — ACETAMINOPHEN 650 MG RE SUPP: 650.0000 mg | Freq: Four times a day (QID) | RECTAL | Status: DC | PRN

## 2016-02-12 MED ORDER — MUPIROCIN 2 % EX OINT
TOPICAL_OINTMENT | Freq: Two times a day (BID) | CUTANEOUS | Status: DC
  Administered 2016-02-12 – 2016-02-14 (×4): via TOPICAL
  Filled 2016-02-12 (×3): qty 22

## 2016-02-12 MED ORDER — SENNOSIDES-DOCUSATE SODIUM 8.6-50 MG PO TABS: 1.0000 | ORAL_TABLET | Freq: Every evening | ORAL | Status: DC | PRN

## 2016-02-12 MED ORDER — ONDANSETRON HCL 4 MG/2ML IJ SOLN: 4.0000 mg | Freq: Four times a day (QID) | INTRAMUSCULAR | Status: DC | PRN

## 2016-02-12 MED ORDER — LEVOTHYROXINE SODIUM 112 MCG PO TABS: 112.0000 ug | ORAL_TABLET | Freq: Every day | ORAL | Status: DC

## 2016-02-12 NOTE — ED Notes (Signed)
Pt transported to CT ?

## 2016-02-12 NOTE — ED Provider Notes (Signed)
[facility] DEPT Provider Note   CSN: [id number] Arrival date & time: 02/12/16  1102     History   Chief Complaint Chief Complaint  Patient presents with  . Fever  . Rash  . Neck Pain    HPI Kelly Myers is a 60 y.o. female.  HPI Patient symptoms started a little less than a week ago with some feelings of general malaise and decreased energy. Patient had noted some discomfort along the sides of her neck which she attributed to possibly yoga class or stretching. She did not have specific symptoms at that time. She and her husband went on to [place] over the weekend and she felt a little under the weather but not to poorly to travel. By Monday she reports symptoms seemed worse with feelings of chills and aches. She had some mild headache in the mornings. Patient noted that she was also feeling a sensation of tightness with trying to take a deep breath up around the top of her chest and throat. She reports she has a distant history of asthma but has not needed an inhaler in over 7 years. She states that if she had had her inhaler she would've tried it. She denies specific chest pain. Denies pain or swelling of the calves or legs. There is been no syncope or near syncope. No vomiting or diarrhea.  Patient has been treated for 2 weeks with minocycline for a minor perioral rash. She reports her dermatologist called. Perioral dermatitis. She reports it really hasn't changed since doing the treatment. Past Medical History:  Diagnosis Date  . Asthma    Asthma cough- meds control  . Atrophic vaginitis   . Bleeds easily ([place])    "have had clotting studies, tests, I'm within normal limits" (02/12/2016)  . Endometriosis   . 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 History of blood transfusion 1984   following childbirth.  . Hypertension   . Hypothyroidism   . Pneumonia    "as a child" (02/12/2016)  . Thyroid nodule   . Uterine prolapse     Patient Active Problem List   Diagnosis Date Noted  . Tachycardia 02/12/2016  . Elevated troponin 02/12/2016  . Abnormal EKG 02/12/2016  . Transaminitis 02/12/2016  . Elevated sed rate 02/12/2016  . Hypothyroid   . Rash and nonspecific skin eruption   . Osteopenia   . Atrophic vaginitis   . Endometriosis   . 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 Hypertension 11/17/2010    Past Surgical History:  Procedure Laterality Date  . ADENOIDECTOMY  [id number]  . ANTERIOR AND POSTERIOR VAGINAL REPAIR  2000s  . BLADDER SUSPENSION  2000s  . CESAREAN SECTION  1984  . COLONOSCOPY WITH PROPOFOL N/A 11/04/2015   Procedure: COLONOSCOPY WITH PROPOFOL;  Surgeon: Garlan Fair, MD;  Location: [facility] ENDOSCOPY;  Service: Endoscopy;  Laterality: N/A;  . TUBAL LIGATION  1984  . VAGINAL HYSTERECTOMY  2000s   w/bladder suspension and A/P repair  . WISDOM TOOTH EXTRACTION  1974    OB History    Gravida Para Term Preterm AB Living   2 2 1 1   2    SAB TAB Ectopic Multiple Live Births                   Home Medications    Prior to Admission medications   Medication Sig Start Date End Date Taking? Authorizing Provider  Calcium Carbonate-Vitamin D (CALCIUM + D PO) Take 1  tablet by mouth daily.    Yes Historical Provider, MD  Cetirizine HCl (ZYRTEC ALLERGY) 10 MG CAPS Take 10 mg by mouth daily.    Yes Historical Provider, MD  Cyanocobalamin (VITAMIN B-12 PO) Take 1 tablet by mouth daily.   Yes Historical Provider, MD  estradiol (ESTRACE) 0.5 MG tablet Take 1 tablet (0.5 mg total) by mouth daily. 02/11/16  Yes Huel Cote, NP  Estradiol (VAGIFEM) 10 MCG TABS vaginal tablet Place 1 tablet (10 mcg total) vaginally 2 (two) times a week. Monday and Friday 02/13/16  Yes Huel Cote, NP  fluticasone (FLONASE) 50 MCG/ACT nasal spray Place 2 sprays into both nostrils daily.   Yes Historical Provider, MD  glucosamine-chondroitin 500-400 MG tablet Take 1 tablet by mouth daily.    Yes Historical Provider, MD  levothyroxine (SYNTHROID,  LEVOTHROID) 112 MCG tablet Take 112 mcg by mouth daily.     Yes Historical Provider, MD  lisinopril (PRINIVIL,ZESTRIL) 10 MG tablet Take 10 mg by mouth daily.     Yes Historical Provider, MD  minocycline (MINOCIN,DYNACIN) 50 MG capsule Take 50 mg by mouth 2 (two) times daily.  01/22/16  Yes Historical Provider, MD  montelukast (SINGULAIR) 10 MG tablet Take 10 mg by mouth at bedtime.     Yes Historical Provider, MD  Omega-3 Fatty Acids (FISH OIL PO) Take 1 capsule by mouth daily.   Yes Historical Provider, MD  Probiotic Product (PROBIOTIC DAILY PO) Take 1 capsule by mouth daily.    Yes Historical Provider, MD  Pyridoxine HCl (VITAMIN B-6 PO) Take 1 tablet by mouth daily.   Yes Historical Provider, MD    Family History Family History  Problem Relation Age of Onset  . Hypertension Mother   . Hypertension Father   . Cancer Father     lung  . Hypertension Sister   . Heart disease Sister   . Heart attack Sister   . Heart disease Maternal Grandfather   . Breast cancer Paternal Grandmother     Age 64    Social History Social History  Substance Use Topics  . Smoking status: Former Smoker    Packs/day: 0.50    Years: 10.00    Types: Cigarettes    Quit date: 60  . Smokeless tobacco: Never Used  . Alcohol use 7.2 oz/week    5 Standard drinks or equivalent, 7 Glasses of wine per week     Allergies   Macrodantin [nitrofurantoin] and Neosporin [neomycin-bacitracin zn-polymyx]   Review of Systems Review of Systems 10 Systems reviewed and are negative for acute change except as noted in the HPI.   Physical Exam Updated Vital Signs BP 136/76   Pulse 85   Temp 98.8 F (37.1 C) (Oral)   Resp 18   Ht 5\' 4"  (1.626 m)   Wt 126 lb 9.6 oz (57.4 kg)   SpO2 95%   BMI 21.73 kg/m   Physical Exam  Constitutional: She appears well-developed and well-nourished. No distress.  Patient is clinically well appearance. She is nontoxic and alert. Mental status is clear. No respiratory  distress.  HENT:  Head: Normocephalic and atraumatic.  Right Ear: External ear normal.  Left Ear: External ear normal.  Nose: Nose normal.  Mouth/Throat: Oropharynx is clear and moist.  Bilateral TMs normal. Oral cavity mucous memories are pink and moist without lesions. Posterior oropharynx is widely patent. Patient has a subtle, fine erythematous\papular rash to the lower aspect of the right edge of the mouth and below the  nares. This is fairly subtle in appearance. With magnification I don't identify a distinct vesicles. He has no associated swelling or erythema to suggest cellulitis. There no ulcerations on the lips or oral mucosa.  Eyes: Conjunctivae and EOM are normal. Pupils are equal, round, and reactive to light.  Neck: Neck supple. No tracheal deviation present. No thyromegaly present.  The patient has slight tenderness along the SCM and the anterior triangle in the carotid triangle. I don't appreciate palpable mass in this area. Patient also finds a slightly more comfortable in here with jaw opening. There is however no trismus. There is no meningismus with normal range of motion of the neck and complete forward flexion.  Cardiovascular:  No murmur heard. Tachycardia. Irregularly irregular. Visualization on the monitor however shows tachycardia with ectopy and sinus rhythm.  Pulmonary/Chest: Effort normal and breath sounds normal. No respiratory distress. She has no wheezes.  Abdominal: Soft. She exhibits no distension and no mass. There is no tenderness. There is no guarding.  Musculoskeletal: Normal range of motion. She exhibits no edema, tenderness or deformity.  Calves are soft and nontender. No peripheral edema. No joint erythema or effusions.  Lymphadenopathy:    She has no cervical adenopathy.  Neurological: She is alert.  Skin: Skin is warm and dry.  Patient has minimal findings of rash. Subtle rash described in the HEENT section. Patient has pointed out two elliptical, very  faint erythematous patches of less than 1 cm. One is below the left breast and the other on the anterior left chest wall. Again these are very subtle appearance and have no vesicles, at this time they do not appear suggestive of zoster. Skin is fully examined and there are no areas of purpura, petechiae or other concerning-appearing rash.  Psychiatric: She has a normal mood and affect.  Nursing note and vitals reviewed.    ED Treatments / Results  Labs (all labs ordered are listed, but only abnormal results are displayed) Labs Reviewed  COMPREHENSIVE METABOLIC PANEL - Abnormal; Notable for the following:       Result Value   Glucose, Bld 132 (*)    BUN <5 (*)    Total Protein 6.3 (*)    Albumin 3.4 (*)    AST 51 (*)    ALT 89 (*)    Alkaline Phosphatase 205 (*)    Total Bilirubin 1.7 (*)    All other components within normal limits  BRAIN NATRIURETIC PEPTIDE - Abnormal; Notable for the following:    B Natriuretic Peptide 263.0 (*)    All other components within normal limits  TROPONIN I - Abnormal; Notable for the following:    Troponin I 0.13 (*)    All other components within normal limits  CBC WITH DIFFERENTIAL/PLATELET - Abnormal; Notable for the following:    Lymphs Abs 0.6 (*)    All other components within normal limits  URINALYSIS, ROUTINE W REFLEX MICROSCOPIC (NOT AT [facility]) - Abnormal; Notable for the following:    Ketones, ur 40 (*)    All other components within normal limits  RAPID URINE DRUG SCREEN, HOSP PERFORMED - Abnormal; Notable for the following:    Tetrahydrocannabinol POSITIVE (*)    All other components within normal limits  D-DIMER, QUANTITATIVE (NOT AT [facility]) - Abnormal; Notable for the following:    D-Dimer, Quant 0.97 (*)    All other components within normal limits  SEDIMENTATION RATE - Abnormal; Notable for the following:    Sed Rate 50 (*)  All other components within normal limits  TSH - Abnormal; Notable for the following:    TSH 0.261 (*)     All other components within normal limits  T4, FREE - Abnormal; Notable for the following:    Free T4 1.20 (*)    All other components within normal limits  TROPONIN I - Abnormal; Notable for the following:    Troponin I 0.12 (*)    All other components within normal limits  TROPONIN I - Abnormal; Notable for the following:    Troponin I 0.12 (*)    All other components within normal limits  CBC - Abnormal; Notable for the following:    RBC 3.79 (*)    Hemoglobin 11.1 (*)    HCT 33.5 (*)    Platelets 146 (*)    All other components within normal limits  COMPREHENSIVE METABOLIC PANEL - Abnormal; Notable for the following:    Glucose, Bld 112 (*)    BUN <5 (*)    Calcium 8.6 (*)    Total Protein 5.7 (*)    Albumin 2.9 (*)    AST 53 (*)    ALT 80 (*)    Alkaline Phosphatase 190 (*)    All other components within normal limits  COMPREHENSIVE METABOLIC PANEL - Abnormal; Notable for the following:    Glucose, Bld 110 (*)    BUN <5 (*)    Total Protein 5.7 (*)    Albumin 2.8 (*)    ALT 70 (*)    Alkaline Phosphatase 187 (*)    All other components within normal limits  CBC - Abnormal; Notable for the following:    RBC 3.62 (*)    Hemoglobin 10.6 (*)    HCT 32.1 (*)    All other components within normal limits  CULTURE, BLOOD (ROUTINE X 2)  CULTURE, BLOOD (ROUTINE X 2)  PROTIME-INR  T3, FREE  THYROID PEROXIDASE ANTIBODY  HEPATITIS PANEL, ACUTE  I-STAT CG4 LACTIC ACID, ED    EKG  EKG Interpretation  Date/Time:  Wednesday February 12 2016 11:03:28 EDT Ventricular Rate:  119 PR Interval:    QRS Duration: 71 QT Interval:  303 QTC Calculation: 427 R Axis:   83 Text Interpretation:  Sinus tachycardia Multiform ventricular premature complexes Consider left atrial enlargement Probable anteroseptal infarct, recent agree Confirmed by Johnney Killian, MD, Jeannie Done [phone]) on 02/12/2016 1:41:01 PM       Radiology Dg Chest 2 View  Result Date: 02/12/2016 CLINICAL DATA:  Dyspnea.  EXAM: CHEST  2 VIEW COMPARISON:  None. FINDINGS: The heart size and mediastinal contours are within normal limits. Both lungs are clear. No pneumothorax or pleural effusion is noted. The visualized skeletal structures are unremarkable. IMPRESSION: No active cardiopulmonary disease. Electronically Signed   By: Marijo Conception, M.D.   On: 02/12/2016 13:27   Ct Angio Chest Pe W/cm &/or Wo Cm  Result Date: 02/12/2016 CLINICAL DATA:  Dyspnea and elevated D-dimer. Assess pulmonary embolism and/or aortic dissection. EXAM: CT ANGIOGRAPHY CHEST WITH CONTRAST TECHNIQUE: Multidetector CT imaging of the chest was performed using the standard protocol during bolus administration of intravenous contrast. Multiplanar CT image reconstructions and MIPs were obtained to evaluate the vascular anatomy. CONTRAST:  100 mL Isovue 370 COMPARISON:  None. FINDINGS: Cardiovascular: Overall heart size is within normal limits, however there is circumferential left ventricular wall thickening. No aortic valvular calcifications appreciated. Negative for a pleural effusion. The thoracic aorta is well opacified with contrast and is normal in caliber. Negative for  luminal intimal flap. No findings to suggest dissection. Negative for mediastinal hematoma. The pulmonary arterial tree is well opacified with contrast. The central pulmonary arteries are normal in size and enhancement. No filling defects are identified within the pulmonary arteries to suggest embolism. Mediastinum/Nodes: Negative for mediastinal, hilar, or supraclavicular lymphadenopathy. Lungs/Pleura: There is mild generalized mosaic attenuation of the lungs, suggesting some areas of air trapping, nonspecific, but can be seen in the setting of small airways disease. Negative for consolidation, edema, or mass. Upper Abdomen: No acute abnormality. Musculoskeletal: No chest wall abnormality. No acute or significant osseous findings. Other:  No breast masses identified. Review of the MIP  images confirms the above findings. IMPRESSION: 1. Negative for acute pulmonary embolism. 2. Negative for aortic dissection or aneurysm. 3. Circumferential left ventricular wall thickening 4. Mosaic attenuation of the lungs, suggesting scattered areas of air trapping. No airspace disease or acute abnormality identified. Electronically Signed   By: Curlene Dolphin M.D.   On: 02/12/2016 15:01   US Abdomen Limited Ruq  Result Date: 02/13/2016 CLINICAL DATA:  Elevated LFTs.  History of hypertension. EXAM: US ABDOMEN LIMITED - RIGHT UPPER QUADRANT COMPARISON:  None. FINDINGS: Gallbladder: Normal sonographic appearance of the gallbladder. No gallbladder wall thickening or pericholecystic fluid. No echogenic gallbladder stones or biliary sludge. Negative sonographic Murphy's sign. Common bile duct: Diameter: Norman size measuring 2.1 mm in diameter Liver: Normal sonographic appearance of the liver. No discrete hepatic lesions. No intrahepatic bili duct dilatation. No ascites. IMPRESSION: No explanation for patient's elevated LFTs. Electronically Signed   By: Sandi Mariscal M.D.   On: 02/13/2016 11:16    Procedures Procedures (including critical care time) CRITICAL CARE Performed by: Charlesetta Shanks   Total critical care time:45 minutes  Critical care time was exclusive of separately billable procedures and treating other patients.  Critical care was necessary to treat or prevent imminent or life-threatening deterioration.  Critical care was time spent personally by me on the following activities: development of treatment plan with patient and/or surrogate as well as nursing, discussions with consultants, evaluation of patient's response to treatment, examination of patient, obtaining history from patient or surrogate, ordering and performing treatments and interventions, ordering and review of laboratory studies, ordering and review of radiographic studies, pulse oximetry and re-evaluation of patient's  condition. Medications Ordered in ED Medications  fluticasone (FLONASE) 50 MCG/ACT nasal spray 2 spray (0 sprays Each Nare Duplicate [id number])  estradiol (ESTRACE) tablet 0.5 mg (0 mg Oral Duplicate [id number])  loratadine (CLARITIN) tablet 10 mg (0 mg Oral Duplicate [id number])  montelukast (SINGULAIR) tablet 10 mg (10 mg Oral Given 02/13/16 2257)  enoxaparin (LOVENOX) injection 40 mg (40 mg Subcutaneous Given 02/13/16 2256)  sodium chloride flush (NS) 0.9 % injection 3 mL (0 mLs Intravenous Duplicate [id number])  acetaminophen (TYLENOL) tablet 650 mg (650 mg Oral Given 02/14/16 0739)    Or  acetaminophen (TYLENOL) suppository 650 mg ( Rectal See Alternative 02/14/16 0739)  HYDROcodone-acetaminophen (NORCO/VICODIN) 5-325 MG per tablet 1-2 tablet (not administered)  senna-docusate (Senokot-S) tablet 1 tablet (not administered)  ondansetron (ZOFRAN) tablet 4 mg (not administered)    Or  ondansetron (ZOFRAN) injection 4 mg (not administered)  metoprolol tartrate (LOPRESSOR) tablet 25 mg (25 mg Oral Given 02/14/16 0700)  lisinopril (PRINIVIL,ZESTRIL) tablet 10 mg (0 mg Oral Duplicate [id number])  calcium-vitamin D (OSCAL WITH D) 500-200 MG-UNIT per tablet 1 tablet (1 tablet Oral Given 02/14/16 0734)  mupirocin ointment (BACTROBAN) 2 % ( Topical Duplicate [id number])  levothyroxine (SYNTHROID, LEVOTHROID) tablet 100 mcg (100 mcg Oral Given 02/14/16 0700)  sodium chloride 0.9 % bolus 1,000 mL (0 mLs Intravenous Stopped 02/12/16 1824)  iopamidol (ISOVUE-370) 76 % injection (100 mLs  Contrast Given 02/12/16 1422)  0.9 %  sodium chloride infusion ( Intravenous New Bag/Given 02/12/16 2002)  chlorpheniramine-HYDROcodone (TUSSIONEX) 10-8 MG/5ML suspension 5 mL (5 mLs Oral Given 02/13/16 2256)     Initial Impression / Assessment and Plan / ED Course  I have reviewed the triage vital signs and the nursing notes.  Pertinent labs & imaging results that were available during my care of the patient  were reviewed by me and considered in my medical decision making (see chart for details).  Clinical Course  Consultation: Cardiology is consulted for elevated troponin and EKG with poor R-wave progression. Consultation: Hospitalist for admission. Final Clinical Impressions(s) / ED Diagnoses   Final diagnoses:  Tachycardia  Dyspnea, unspecified type  Malaise and fatigue  Troponin I above reference range  EKG abnormalities  Patient sent from outpatient primary care office for concerns of neck stiffness and rash. At this time, by examination and history there is no indication of meningitic process. Her neck discomfort is along her lateral neck in the area of the sternocleidomastoid and anterior cervical angle, she has no meningismus. Identified rash is not suggestive of serious etiology, this is been present for weeks and unchanged and is very mild in appearance.At this time, I cannot see any vesicles that would suggest HSV. No pustules and no associated cellulitis. Primary findings are for persistent tachycardia, EKG with poor R-wave progression mild troponin elevation and d-dimer elevation. Patient does not have elevated lactic acid or leukocytosis. At this time, Differential diagnosis includes myocarditis of Uncertain etiology, viral pneumonia or autoimmune pneumonitis based on CT findings, less likely appears to be bacterial pneumonia. By diagnostic study, PE and dissection have been ruled out. Patient remains tachycardic but clinically stable with normal mental status, normal respiratory status and good perfusion. At this time we'll plan for admission to hospital service with consultation to cardiology for further diagnostic evaluation and treatment.  New Prescriptions Current Discharge Medication List       Charlesetta Shanks, MD 02/14/16 [phone]

## 2016-02-12 NOTE — ED Notes (Signed)
Delay in lab draw,  Provider examining pt.

## 2016-02-12 NOTE — Consult Note (Signed)
Cardiology Consult    Patient ID: Kelly Myers MRN: [id number], DOB/AGE: 07/24/1955   Admit date: 02/12/2016 Date of Consult: 02/12/2016  Primary Physician: Gerrit Heck, MD Primary Cardiologist: New Requesting Provider: Dr. Johnney Killian Reason for Consultation: Elevated Trop  Patient Profile    60 yo female with PMH of asthma, endometriosis, HTN, and hypothyroidism who presented to the ED with reports of generalized fatigue and neck pain.   Past Medical History   Past Medical History:  Diagnosis Date  . Asthma    Asthma cough- meds control  . Atrophic vaginitis   . Endometriosis   . 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 EYE ALLERGIES  . Hypertension   . Hypothyroidism   . Transfusion history    '84- following childbirth.  . Uterine prolapse     Past Surgical History:  Procedure Laterality Date  . ADENOIDECTOMY    . BLADDER SUSPENSION    . CESAREAN SECTION    . COLONOSCOPY WITH PROPOFOL N/A 11/04/2015   Procedure: COLONOSCOPY WITH PROPOFOL;  Surgeon: Garlan Fair, MD;  Location: [facility];  Service: Endoscopy;  Laterality: N/A;  . MOUTH SURGERY    . TUBAL LIGATION    . VAGINAL HYSTERECTOMY     Vag Hyst -A/P repair     Allergies  Allergies  Allergen Reactions  . Macrodantin [Nitrofurantoin] Hives  . Neosporin [Neomycin-Bacitracin Zn-Polymyx] Rash    History of Present Illness    Kelly Myers is a 60 year old female with past medical history of hypertension, hypothyroidism, endometriosis, asthma. States she is followed by her PCP, and has never had a cardiac evaluation. Reports family history of CAD with her sister having an MI with stent placement in her 55s. She reports she has never smoked, denies any cholesterol issues, and has never been diagnosed with diabetes.  Reports she has been in her usual state of health, up until Sunday. Has been being treated for a rash across her mouth and lower face by her dermatologist with minocycline for the past 2  weeks. States she has been treated for with antibiotics by her dermatologist over the past couple of years for nonspecific rash, and rosacea. Has had difficulties with antibiotics causing her palpitations prior specifically clindamycin. Reports on Sunday she had some neck pain, and just generally did not feel well. States that her symptoms seemed to get worse, and had chills and aches with questionable fever, but did not take her temperature. Presented to her PCP office today and noted to be febrile with a temperature of 100.4, along with tachycardic with a heart rate of 140. She was transported to the ED for further evaluation.   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 Never experiences any anginal symptoms, or dyspnea during this activity. Denies any lower extremity edema, nonproductive cough or abdominal fullness.   In the ED her labs showed stable electrolytes, creatinine of 0.67, elevated liver enzymes, BNP 263, troponin 0.13. Chest x-ray was normal. EKG shows sinus tach with possible atrial enlargement, and PVCs, along with poor R-wave progression.   Inpatient Medications    . calcium citrate-vitamin D  1 tablet Oral Daily  . enoxaparin (LOVENOX) injection  40 mg Subcutaneous Q24H  . estradiol  0.5 mg Oral Daily  . fluticasone  2 spray Each Nare Daily  . glucosamine-chondroitin  1 tablet Oral Daily  . levothyroxine  112 mcg Oral Daily  . lisinopril  10 mg Oral Daily  .  loratadine  10 mg Oral Daily  . minocycline  50 mg Oral BID  . montelukast  10 mg Oral QHS  . sodium chloride flush  3 mL Intravenous Q12H    Family History    Family History  Problem Relation Age of Onset  . Hypertension Mother   . Hypertension Father   . Cancer Father     lung  . Hypertension Sister   . Heart disease Sister   . Heart attack Sister   . Heart disease Maternal Grandfather   . Breast cancer Paternal Grandmother     Age 32    Social History      Social History   Social History  . Marital status: Married    Spouse name: N/A  . Number of children: N/A  . Years of education: N/A   Occupational History  . Not on file.   Social History Main Topics  . Smoking status: Former Smoker    Types: Cigarettes  . Smokeless tobacco: Never Used  . Alcohol use 2.5 oz/week    5 Standard drinks or equivalent per week     Comment: wine with dinner  . Drug use: No  . Sexual activity: Yes    Birth control/ protection: Surgical   Other Topics Concern  . Not on file   Social History Narrative  . No narrative on file     Review of Systems    General:  No chills, fever, night sweats or weight changes.  Cardiovascular:  See HPI Dermatological: No rash, lesions/masses Respiratory: No cough, dyspnea Urologic: No hematuria, dysuria Abdominal:   No nausea, vomiting, diarrhea, bright red blood per rectum, melena, or hematemesis Neurologic:  No visual changes, wkns, changes in mental status. All other systems reviewed and are otherwise negative except as noted above.  Physical Exam    Blood pressure 117/86, pulse 115, temperature 98.9 F (37.2 C), temperature source Oral, resp. rate 20, SpO2 96 %.  General: Pleasant older female, NAD Psych: Normal affect. Neuro: Alert and oriented X 3. Moves all extremities spontaneously. HEENT: Normal  Neck: Supple without bruits or JVD. Lungs:  Resp regular and unlabored, CTA. Heart: Tachy no s3, s4, or murmurs. Abdomen: Soft, non-tender, non-distended, BS + x 4.  Extremities: No clubbing, cyanosis or edema. DP/PT/Radials 2+ and equal bilaterally.  Labs    Troponin (Point of Care Test) No results for input(s): TROPIPOC in the last 72 hours.  Recent Labs  02/12/16 1220  TROPONINI 0.13*   Lab Results  Component Value Date   WBC 8.0 02/12/2016   HGB 12.2 02/12/2016   HCT 36.0 02/12/2016   MCV 87.6 02/12/2016   PLT 152 02/12/2016    Recent Labs Lab 02/12/16 1220  NA 136  K 3.8  CL  104  CO2 23  BUN <5*  CREATININE 0.67  CALCIUM 9.0  PROT 6.3*  BILITOT 1.7*  ALKPHOS 205*  ALT 89*  AST 51*  GLUCOSE 132*   No results found for: CHOL, HDL, LDLCALC, TRIG Lab Results  Component Value Date   DDIMER 0.97 (H) 02/12/2016     Radiology Studies    Dg Chest 2 View  Result Date: 02/12/2016 CLINICAL DATA:  Dyspnea. EXAM: CHEST  2 VIEW COMPARISON:  None. FINDINGS: The heart size and mediastinal contours are within normal limits. Both lungs are clear. No pneumothorax or pleural effusion is noted. The visualized skeletal structures are unremarkable. IMPRESSION: No active cardiopulmonary disease. Electronically Signed   By: Marijo Conception,  M.D.   On: 02/12/2016 13:27   Ct Angio Chest Pe W/cm &/or Wo Cm  Result Date: 02/12/2016 CLINICAL DATA:  Dyspnea and elevated D-dimer. Assess pulmonary embolism and/or aortic dissection. EXAM: CT ANGIOGRAPHY CHEST WITH CONTRAST TECHNIQUE: Multidetector CT imaging of the chest was performed using the standard protocol during bolus administration of intravenous contrast. Multiplanar CT image reconstructions and MIPs were obtained to evaluate the vascular anatomy. CONTRAST:  100 mL Isovue 370 COMPARISON:  None. FINDINGS: Cardiovascular: Overall heart size is within normal limits, however there is circumferential left ventricular wall thickening. No aortic valvular calcifications appreciated. Negative for a pleural effusion. The thoracic aorta is well opacified with contrast and is normal in caliber. Negative for luminal intimal flap. No findings to suggest dissection. Negative for mediastinal hematoma. The pulmonary arterial tree is well opacified with contrast. The central pulmonary arteries are normal in size and enhancement. No filling defects are identified within the pulmonary arteries to suggest embolism. Mediastinum/Nodes: Negative for mediastinal, hilar, or supraclavicular lymphadenopathy. Lungs/Pleura: There is mild generalized mosaic  attenuation of the lungs, suggesting some areas of air trapping, nonspecific, but can be seen in the setting of small airways disease. Negative for consolidation, edema, or mass. Upper Abdomen: No acute abnormality. Musculoskeletal: No chest wall abnormality. No acute or significant osseous findings. Other:  No breast masses identified. Review of the MIP images confirms the above findings. IMPRESSION: 1. Negative for acute pulmonary embolism. 2. Negative for aortic dissection or aneurysm. 3. Circumferential left ventricular wall thickening 4. Mosaic attenuation of the lungs, suggesting scattered areas of air trapping. No airspace disease or acute abnormality identified. Electronically Signed   By: Curlene Dolphin M.D.   On: 02/12/2016 15:01    ECG & Cardiac Imaging    EKG: sinus tach with possible atrial enlargement, and PVCs, along with poor R-wave progression  Echo: None  Assessment & Plan    60 yo female with PMH of asthma, endometriosis, HTN, and hypothyroidism who presented to the ED with reports of generalized fatigue and neck pain.   1. Elevated troponin: She denies any history of CAD, but does have hypertension that appears to be well-controlled. Reports she walks on a regular basis, many times walking several miles without any anginal symptoms or dyspnea. Is noted to be tachycardic, states she has felt some palpitations over the past couple of days.  -- Would continue to trend troponins, obtain 2-D echo to assess for wall motion abnormality. -- May be demand ischemia in the setting of tachycardia, with determine further workup based on echo results.  2. HTN: Well controlled:  3. Tachycardia: Reports generalized fatigue over the past couple of days, question whether there is a viral component related to this as she has no complaints of cough, or congestion but yet has fever. States she recently had her flu shot. Has been on minocycline for nonspecific perioral rash for the past 2 weeks.  States she's had trouble with antibiotics in the past causing palpitations.  -- recommend telemetry to assess for arhythmia -- May be somewhat dehydrated as PO intake has been low, and has been febrile. Was given 1L NS in the ED.   4. Elevated LFTs and Alk phos: Denies any abd pain. Question whether this could be related to recent antibiotic use?  Barnet Pall, NP-C Pager [phone] 02/12/2016, 4:23 PM   Patient seen and examined. Agree with assessment and plan.  Ms. Thayer Headings feels is a 60 year old Caucasian female is followed by Dr. Forde Dandy for hypothyroidism  and is on levothyroxine at 112 picograms daily.  She states that typically her TSH tends to be on the lower side.  She a history of hypertension for which she has been treated with lisinopril.  She recently developed a rash on her face and was started on minocycline.  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  She lies any exertionally precipitated chest pain.  Monday evening.  She did yoga without difficulty.  The past several days she has noticed some malaise and has not felt well.  She presented to her PCP office where she was found to be febrile at 100.4 and tachycardic at 140 bpm.  Laboratory upon arrival to the emergency room was notable for increased d-dimer.  A chest CT was negative for PE.  BNP was minimally increased at 263, and troponin 0.13.  Liver function studies were elevated with an alkaline phosphatase of 205, a LTS 89, AST at 51.  She usually has one glass of wine with dinner but not every night and most likely 5 days out of 7.  She does have a family history for CAD with a sister having undergone PCI.  Her physical exam is notable that she is tachycardic at 1 35 bpm.  HEENT was unremarkable.  There are no carotid bruits.  There was no chest wall tenderness to palpation.  Her lungs were clear.  Rhythm was regular but tachycardic with a 1/6 systolic murmur.   Abdomen was soft.  She does not have right upper quadrant tenderness.  Pulses are 2+.  There was no edema.  ECG shows sinus tachycardia with PVCs with poor progression anteriorly.  Presently, recommend checking a TSH level to make certain she is not over suppressed, which may be contributed to her tachycardia.  Follow-up liver function studies will be necessary.  If continued to be positive, she may need ultrasound evaluation.  The mild troponin elevation, I suspect is demand ischemia rather than ACS contributed by her tachycardia.  I will initiate metoprolol, tartrate 25 mg every 8 hours.  Presently.  A 2-D echo Doppler study will be ordered to assess systolic and diastolic function and valvular architecture.  We will try to obtain records from Dr. Baldwin Crown office.  We will follow the patient with you.   Troy Sine, MD, [facility] 02/12/2016 5:32 PM

## 2016-02-12 NOTE — ED Triage Notes (Signed)
Pt to ER BIB [facility] from PCP office for further evaluation of fever, neck pain, and rash onset approximately 3-4 days ago. Pt has been on antibiotics for "peri-orbital dermatitis" for 2 weeks. Noticed she was developing a rash, noted to chest and left axillary area onset yesterday. Reports chills since Sunday, fever noted at PCP office of 100.4, was given 1 g tylenol prior to arrival, temp at this time 98.9. Reports neck stiffness x [redacted] week along with daily headaches every morning since Saturday. Pt was also complaining of chest tightness with deep inspiration x3 days, was given 324 mg aspirin in route and 1 nitro by PCP, pt reports pain free at this time. HR was initially 140, down to 120 after 700 cc NS in route. Pt is alert and oriented x4. NAD at this time.

## 2016-02-12 NOTE — ED Notes (Signed)
Pt ambulated to restroom from room, tolerated well. 

## 2016-02-12 NOTE — ED Notes (Signed)
Returned from xray

## 2016-02-12 NOTE — ED Notes (Signed)
Patient transported to X-ray 

## 2016-02-12 NOTE — H&P (Signed)
History and Physical    Kelly Myers [id number] DOB: 07-18-1955 DOA: 02/12/2016  PCP: Gerrit Heck, MD Patient coming from: home  Chief Complaint: generalized fatigue and persistent neck pain  HPI: Kelly Myers is a 60 y.o. female with medical history significant of asthma, endometriosis, hypertension and hypothyroidism presents to the emergency department with the chief complaint of generalized fatigue and neck pain  Information is obtained from the patient. She states 3 days ago he developed some neck pain and generally did not feel well. She's been on antibiotics for 2 weeks for rash across her mouth and lower face prescribed by Dr. Denna Haggard her dermatologist.  He states her symptoms got worse over the next 2 days she developed chills and bodyaches subjective fever. She went to her PCP today and was noted to be febrile with temperature 100.4 and tachycardic with a heart rate of 140. She was transported to the emergency department for further evaluation. She denies chest pain palpitations headache dizziness syncope or near-syncope. She denies shortness of breath cough lower extremity edema or orthopnea. She denies dysuria hematuria frequency or urgency. She denies abdominal pain nausea vomiting diarrhea constipation. She does report palpitations in the past with basic antibiotic is not sure which one. She feels like be on antibiotics she's been on for the last 2 weeks be related.  ED Course: In the emergency department she's afebrile hemodynamically stable slightly tachycardic not hypoxic.  Review of Systems: As per HPI otherwise 10 point review of systems negative.   Ambulatory Status: She ambulates independently with a steady gait no recent falls  Past Medical History:  Diagnosis Date  . Asthma    Asthma cough- meds control  . Atrophic vaginitis   . Endometriosis   . 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 EYE ALLERGIES  . Hypertension   . Hypothyroidism   . Transfusion  history    '84- following childbirth.  . Uterine prolapse     Past Surgical History:  Procedure Laterality Date  . ADENOIDECTOMY    . BLADDER SUSPENSION    . CESAREAN SECTION    . COLONOSCOPY WITH PROPOFOL N/A 11/04/2015   Procedure: COLONOSCOPY WITH PROPOFOL;  Surgeon: Garlan Fair, MD;  Location: [facility];  Service: Endoscopy;  Laterality: N/A;  . MOUTH SURGERY    . TUBAL LIGATION    . VAGINAL HYSTERECTOMY     Vag Hyst -A/P repair    Social History   Social History  . Marital status: Married    Spouse name: N/A  . Number of children: N/A  . Years of education: N/A   Occupational History  . Not on file.   Social History Main Topics  . Smoking status: Former Smoker    Types: Cigarettes  . Smokeless tobacco: Never Used  . Alcohol use 2.5 oz/week    5 Standard drinks or equivalent per week     Comment: wine with dinner  . Drug use: No  . Sexual activity: Yes    Birth control/ protection: Surgical   Other Topics Concern  . Not on file   Social History Narrative  . 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 Macrodantin [Nitrofurantoin] Hives  . Neosporin [Neomycin-Bacitracin Zn-Polymyx] Rash    Family History  Problem Relation Age of Onset  . Hypertension Mother   . Hypertension Father   . Cancer Father     lung  .  Hypertension Sister   . Heart disease Sister   . Heart attack Sister   . Heart disease Maternal Grandfather   . Breast cancer Paternal Grandmother     Age 6    Prior to Admission medications   Medication Sig Start Date End Date Taking? Authorizing Provider  Calcium Carbonate-Vitamin D (CALCIUM + D PO) Take 1 tablet by mouth daily.    Yes Historical Provider, MD  Cetirizine HCl (ZYRTEC ALLERGY) 10 MG CAPS Take 10 mg by mouth daily.    Yes Historical Provider, MD  Cyanocobalamin (VITAMIN B-12 PO) Take 1 tablet by mouth daily.   Yes Historical Provider, MD    estradiol (ESTRACE) 0.5 MG tablet Take 1 tablet (0.5 mg total) by mouth daily. 02/11/16  Yes Huel Cote, NP  Estradiol (VAGIFEM) 10 MCG TABS vaginal tablet Place 1 tablet (10 mcg total) vaginally 2 (two) times a week. Monday and Friday 02/13/16  Yes Huel Cote, NP  fluticasone (FLONASE) 50 MCG/ACT nasal spray Place 2 sprays into both nostrils daily.   Yes Historical Provider, MD  glucosamine-chondroitin 500-400 MG tablet Take 1 tablet by mouth daily.    Yes Historical Provider, MD  levothyroxine (SYNTHROID, LEVOTHROID) 112 MCG tablet Take 112 mcg by mouth daily.     Yes Historical Provider, MD  lisinopril (PRINIVIL,ZESTRIL) 10 MG tablet Take 10 mg by mouth daily.     Yes Historical Provider, MD  minocycline (MINOCIN,DYNACIN) 50 MG capsule Take 50 mg by mouth 2 (two) times daily.  01/22/16  Yes Historical Provider, MD  montelukast (SINGULAIR) 10 MG tablet Take 10 mg by mouth at bedtime.     Yes Historical Provider, MD  Omega-3 Fatty Acids (FISH OIL PO) Take 1 capsule by mouth daily.   Yes Historical Provider, MD  Probiotic Product (PROBIOTIC DAILY PO) Take 1 capsule by mouth daily.    Yes Historical Provider, MD  Pyridoxine HCl (VITAMIN B-6 PO) Take 1 tablet by mouth daily.   Yes Historical Provider, MD    Physical Exam: Vitals:   02/12/16 1453 02/12/16 1500 02/12/16 1515 02/12/16 1530  BP: 134/83 129/86 126/81 117/86  Pulse: 116 115 113 115  Resp: 16 25 13 20   Temp:      TempSrc:      SpO2: 100% 98% 98% 96%     General:  Appears calm and comfortable, eating up in bed watching TV Eyes:  PERRL, EOMI, normal lids, iris ENT:  grossly normal hearing, lips & tongue, his membranes of her mouth are moist and pink Neck:  no LAD, masses or thyromegaly Cardiovascular:  Tachycardic but regular, no m/r/g. No LE edema.  Respiratory:  CTA bilaterally, no w/r/r. Normal respiratory effort. Abdomen:  soft, mild tenderness RUQ no guarding or rebounding Skin:  no rash or induration seen on limited  exam Musculoskeletal:  grossly normal tone BUE/BLE, good ROM, no bony abnormality Psychiatric:  grossly normal mood and affect, speech fluent and appropriate, AOx3 Neurologic:  CN 2-12 grossly intact, moves all extremities in coordinated fashion, sensation intact  Labs on Admission: I have personally reviewed following labs and imaging studies  CBC:  Recent Labs Lab 02/12/16 1220  WBC 8.0  NEUTROABS 6.2  HGB 12.2  HCT 36.0  MCV 87.6  PLT 0000000   Basic Metabolic Panel:  Recent Labs Lab 02/12/16 1220  NA 136  K 3.8  CL 104  CO2 23  GLUCOSE 132*  BUN <5*  CREATININE 0.67  CALCIUM 9.0   GFR: Estimated Creatinine Clearance:  66.5 mL/min (by C-G formula based on SCr of 0.67 mg/dL). Liver Function Tests:  Recent Labs Lab 02/12/16 1220  AST 51*  ALT 89*  ALKPHOS 205*  BILITOT 1.7*  PROT 6.3*  ALBUMIN 3.4*   No results for input(s): LIPASE, AMYLASE in the last 168 hours. No results for input(s): AMMONIA in the last 168 hours. Coagulation Profile:  Recent Labs Lab 02/12/16 1220  INR 1.06   Cardiac Enzymes:  Recent Labs Lab 02/12/16 1220  TROPONINI 0.13*   BNP (last 3 results) No results for input(s): PROBNP in the last 8760 hours. HbA1C: No results for input(s): HGBA1C in the last 72 hours. CBG: No results for input(s): GLUCAP in the last 168 hours. Lipid Profile: No results for input(s): CHOL, HDL, LDLCALC, TRIG, CHOLHDL, LDLDIRECT in the last 72 hours. Thyroid Function Tests:  Recent Labs  02/12/16 1213  TSH 0.261*   Anemia Panel: No results for input(s): VITAMINB12, FOLATE, FERRITIN, TIBC, IRON, RETICCTPCT in the last 72 hours. Urine analysis:    Component Value Date/Time   COLORURINE YELLOW 02/12/2016 [place] 02/12/2016 1246   LABSPEC 1.006 02/12/2016 1246   PHURINE 5.5 02/12/2016 1246   GLUCOSEU NEGATIVE 02/12/2016 1246   HGBUR NEGATIVE 02/12/2016 1246   BILIRUBINUR NEGATIVE 02/12/2016 1246   KETONESUR 40 (A)  02/12/2016 1246   PROTEINUR NEGATIVE 02/12/2016 1246   UROBILINOGEN 0.2 11/18/2011 1015   NITRITE NEGATIVE 02/12/2016 1246   LEUKOCYTESUR NEGATIVE 02/12/2016 1246    Creatinine Clearance: Estimated Creatinine Clearance: 66.5 mL/min (by C-G formula based on SCr of 0.67 mg/dL).  Sepsis Labs: @LABRCNTIP (procalcitonin:4,lacticidven:4) )No results found for this or any previous visit (from the past 240 hour(s)).   Radiological Exams on Admission: Dg Chest 2 View  Result Date: 02/12/2016 CLINICAL DATA:  Dyspnea. EXAM: CHEST  2 VIEW COMPARISON:  None. FINDINGS: The heart size and mediastinal contours are within normal limits. Both lungs are clear. No pneumothorax or pleural effusion is noted. The visualized skeletal structures are unremarkable. IMPRESSION: No active cardiopulmonary disease. Electronically Signed   By: Marijo Conception, M.D.   On: 02/12/2016 13:27   Ct Angio Chest Pe W/cm &/or Wo Cm  Result Date: 02/12/2016 CLINICAL DATA:  Dyspnea and elevated D-dimer. Assess pulmonary embolism and/or aortic dissection. EXAM: CT ANGIOGRAPHY CHEST WITH CONTRAST TECHNIQUE: Multidetector CT imaging of the chest was performed using the standard protocol during bolus administration of intravenous contrast. Multiplanar CT image reconstructions and MIPs were obtained to evaluate the vascular anatomy. CONTRAST:  100 mL Isovue 370 COMPARISON:  None. FINDINGS: Cardiovascular: Overall heart size is within normal limits, however there is circumferential left ventricular wall thickening. No aortic valvular calcifications appreciated. Negative for a pleural effusion. The thoracic aorta is well opacified with contrast and is normal in caliber. Negative for luminal intimal flap. No findings to suggest dissection. Negative for mediastinal hematoma. The pulmonary arterial tree is well opacified with contrast. The central pulmonary arteries are normal in size and enhancement. No filling defects are identified within the  pulmonary arteries to suggest embolism. Mediastinum/Nodes: Negative for mediastinal, hilar, or supraclavicular lymphadenopathy. Lungs/Pleura: There is mild generalized mosaic attenuation of the lungs, suggesting some areas of air trapping, nonspecific, but can be seen in the setting of small airways disease. Negative for consolidation, edema, or mass. Upper Abdomen: No acute abnormality. Musculoskeletal: No chest wall abnormality. No acute or significant osseous findings. Other:  No breast masses identified. Review of the MIP images confirms the above findings. IMPRESSION: 1. Negative  for acute pulmonary embolism. 2. Negative for aortic dissection or aneurysm. 3. Circumferential left ventricular wall thickening 4. Mosaic attenuation of the lungs, suggesting scattered areas of air trapping. No airspace disease or acute abnormality identified. Electronically Signed   By: Curlene Dolphin M.D.   On: 02/12/2016 15:01    EKG: Independently reviewed. Sinus tachycardia Multiform ventricular premature complexes Consider left atrial enlargement Probable anteroseptal infarct   Assessment/Plan Principal Problem:   Elevated troponin Active Problems:   Hypertension   Tachycardia   Abnormal EKG   Elevated transaminase level   Elevated sed rate   Hypothyroid   1. Elevated troponin. Etiology uncertain. No chest pain. BNP only slightly elevated. D-dimer elevated. CT chest negative for PE. Rest x-ray with no active cardiopulmonary disease No history of CAD. 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 Evaluated by cardiology who opined may be demand ischemia in the setting of tachycardia -Admit to telemetry -Cycle troponin -Serial EKG -Obtain an echocardiogram -Appreciate cardiology assistant  #2. Tachycardia. TSH 0.261. Free T4, free T3, thyroid peroxidase antibody are pending. Home medications include 140mcg daily -follow thyroid studies - hold Synthroid for now - will start Beta  blocker per cardiology  #3. Elevated transaminase. Patient reports daily glass of wine with dinner. Total bili 1.7 -repeat studies tomorrow -[place] if no improvement  #4. Hypertension. Controlled. Home medications include lisinopril. -Continue lisinopril - will start Beta blocker per cardiology  #5. Hypothyroidism. Home medications include Synthroid. TSH on the low end of normal -We'll hold for now given #2   DVT prophylaxis: scd  Code Status: full  Family Communication: husband at bedside  Disposition Plan: home  Consults called: dr Claiborne Billings cardiology  Admission status: obs    Radene Gunning MD [facility]  If 7PM-7AM, please contact night-coverage [email or web address] Password [email or web address]  02/12/2016, 6:04 PM

## 2016-02-13 ENCOUNTER — Observation Stay (HOSPITAL_COMMUNITY): Payer: Managed Care, Other (non HMO)

## 2016-02-13 ENCOUNTER — Observation Stay (HOSPITAL_BASED_OUTPATIENT_CLINIC_OR_DEPARTMENT_OTHER): Payer: Managed Care, Other (non HMO)

## 2016-02-13 DIAGNOSIS — R0600 Dyspnea, unspecified: Secondary | ICD-10-CM

## 2016-02-13 DIAGNOSIS — R748 Abnormal levels of other serum enzymes: Secondary | ICD-10-CM | POA: Diagnosis not present

## 2016-02-13 DIAGNOSIS — I10 Essential (primary) hypertension: Secondary | ICD-10-CM | POA: Diagnosis not present

## 2016-02-13 DIAGNOSIS — R700 Elevated erythrocyte sedimentation rate: Secondary | ICD-10-CM

## 2016-02-13 DIAGNOSIS — R7989 Other specified abnormal findings of blood chemistry: Secondary | ICD-10-CM | POA: Diagnosis not present

## 2016-02-13 DIAGNOSIS — R740 Nonspecific elevation of levels of transaminase and lactic acid dehydrogenase [LDH]: Secondary | ICD-10-CM | POA: Diagnosis not present

## 2016-02-13 DIAGNOSIS — R9431 Abnormal electrocardiogram [ECG] [EKG]: Secondary | ICD-10-CM | POA: Diagnosis not present

## 2016-02-13 LAB — COMPREHENSIVE METABOLIC PANEL
ALK PHOS: 190 U/L — AB (ref 38–126)
ALT: 80 U/L — ABNORMAL HIGH (ref 14–54)
AST: 53 U/L — AB (ref 15–41)
Albumin: 2.9 g/dL — ABNORMAL LOW (ref 3.5–5.0)
Anion gap: 6 (ref 5–15)
BILIRUBIN TOTAL: 1 mg/dL (ref 0.3–1.2)
CALCIUM: 8.6 mg/dL — AB (ref 8.9–10.3)
CO2: 24 mmol/L (ref 22–32)
Chloride: 110 mmol/L (ref 101–111)
Creatinine, Ser: 0.56 mg/dL (ref 0.44–1.00)
GFR calc Af Amer: >60 mL/min (ref >60)
Glucose, Bld: 112 mg/dL — ABNORMAL HIGH (ref 65–99)
POTASSIUM: 3.8 mmol/L (ref 3.5–5.1)
Sodium: 140 mmol/L (ref 135–145)
TOTAL PROTEIN: 5.7 g/dL — AB (ref 6.5–8.1)

## 2016-02-13 LAB — CBC
HEMATOCRIT: 33.5 % — AB (ref 36.0–46.0)
Hemoglobin: 11.1 g/dL — ABNORMAL LOW (ref 12.0–15.0)
MCH: 29.3 pg (ref 26.0–34.0)
MCHC: 33.1 g/dL (ref 30.0–36.0)
MCV: 88.4 fL (ref 78.0–100.0)
Platelets: 146 10*3/uL — ABNORMAL LOW (ref 150–400)
RBC: 3.79 MIL/uL — ABNORMAL LOW (ref 3.87–5.11)
RDW: 13.4 % (ref 11.5–15.5)
WBC: 5.5 10*3/uL (ref 4.0–10.5)

## 2016-02-13 LAB — ECHOCARDIOGRAM COMPLETE
HEIGHTINCHES: 64 in
WEIGHTICAEL: 2068.8 [oz_av]

## 2016-02-13 LAB — T3, FREE: T3 FREE: 2 pg/mL (ref 2.0–4.4)

## 2016-02-13 LAB — THYROID PEROXIDASE ANTIBODY: Thyroperoxidase Ab SerPl-aCnc: 11 IU/mL (ref 0–34)

## 2016-02-13 MED ORDER — HYDROCOD POLST-CPM POLST ER 10-8 MG/5ML PO SUER
5.0000 mL | Freq: Once | ORAL | Status: AC
  Administered 2016-02-13: 5 mL via ORAL
  Filled 2016-02-13: qty 5

## 2016-02-13 MED ORDER — LEVOTHYROXINE SODIUM 100 MCG PO TABS
100.0000 ug | ORAL_TABLET | Freq: Every day | ORAL | Status: DC
  Administered 2016-02-14: 100 ug via ORAL
  Filled 2016-02-13 (×2): qty 1

## 2016-02-13 NOTE — Progress Notes (Signed)
  Echocardiogram 2D Echocardiogram has been performed.  Jaimie Pippins 02/13/2016, 11:28 AM

## 2016-02-13 NOTE — Progress Notes (Signed)
PROGRESS NOTE    LORIANNE TERHUNE  [id number] DOB: 12/23/1955 DOA: 02/12/2016 PCP: Gerrit Heck, MD   Outpatient Specialists:    Brief Narrative:  60 yo female with PMH of asthma, endometriosis, HTN, and hypothyroidism who presented to the ED with reports of generalized fatigue and neck pain.   Assessment & Plan:   Principal Problem:   Elevated troponin Active Problems:   Hypertension   Tachycardia   Abnormal EKG   Elevated transaminase level   Elevated sed rate   Hypothyroid   Rash and nonspecific skin eruption   Elevated troponin.  -flat -cards consulted -echo pending -CTA negative for PE  Tachycardia. TSH 0.261. Free T4, free T3, thyroid peroxidase antibody done -decrease synthroid -follows with Dr. Forde Dandy as an outpatient -on BB  Elevated transaminase. Patient reports daily glass of wine with dinner. Total bili 1.7 -U/S negative -hepatitis panel pending  Hypertension. Controlled. Home medications include lisinopril. -Continue lisinopril - Beta blocker per cardiology  Hypothyroidism.  -see above   DVT prophylaxis:  Lovenox   Code Status: Full Code   Family Communication: Sister at bedside  Disposition Plan:  Home in AM?   Consultants:   cards      Subjective: Feeling much better  Objective: Vitals:   02/12/16 1856 02/13/16 0415 02/13/16 0631 02/13/16 0900  BP: 139/90 122/72  121/80  Pulse: (!) 113 (!) 103 (!) 108 85  Resp: 14   12  Temp: 98.5 F (36.9 C) 99.4 F (37.4 C)  98.8 F (37.1 C)  TempSrc: Oral Oral  Oral  SpO2: 99% 96%    Weight: 57.7 kg (127 lb 3.2 oz) 58.7 kg (129 lb 4.8 oz)    Height: 5\' 4"  (1.626 m)       Intake/Output Summary (Last 24 hours) at 02/13/16 1306 Last data filed at 02/13/16 0813  Gross per 24 hour  Intake          1245.83 ml  Output                0 ml  Net          1245.83 ml   Filed Weights   02/12/16 1856 02/13/16 0415  Weight: 57.7 kg (127 lb 3.2 oz) 58.7 kg (129 lb  4.8 oz)    Examination:  General exam: Appears calm and comfortable  Respiratory system: Clear to auscultation. Respiratory effort normal. Cardiovascular system: S1 & S2 heard, RRR. No JVD, murmurs, rubs, gallops or clicks. No pedal edema. Gastrointestinal system: Abdomen is nondistended, soft and nontender. No organomegaly or masses felt. Normal bowel sounds heard. .     Data Reviewed: I have personally reviewed following labs and imaging studies  CBC:  Recent Labs Lab 02/12/16 1220 02/13/16 0519  WBC 8.0 5.5  NEUTROABS 6.2  --   HGB 12.2 11.1*  HCT 36.0 33.5*  MCV 87.6 88.4  PLT 152 123456*   Basic Metabolic Panel:  Recent Labs Lab 02/12/16 1220 02/13/16 0519  NA 136 140  K 3.8 3.8  CL 104 110  CO2 23 24  GLUCOSE 132* 112*  BUN <5* <5*  CREATININE 0.67 0.56  CALCIUM 9.0 8.6*   GFR: Estimated Creatinine Clearance: 63.8 mL/min (by C-G formula based on SCr of 0.56 mg/dL). Liver Function Tests:  Recent Labs Lab 02/12/16 1220 02/13/16 0519  AST 51* 53*  ALT 89* 80*  ALKPHOS 205* 190*  BILITOT 1.7* 1.0  PROT 6.3* 5.7*  ALBUMIN 3.4* 2.9*   No results for input(s):  LIPASE, AMYLASE in the last 168 hours. No results for input(s): AMMONIA in the last 168 hours. Coagulation Profile:  Recent Labs Lab 02/12/16 1220  INR 1.06   Cardiac Enzymes:  Recent Labs Lab 02/12/16 1220 02/12/16 1800 02/12/16 2138  TROPONINI 0.13* 0.12* 0.12*   BNP (last 3 results) No results for input(s): PROBNP in the last 8760 hours. HbA1C: No results for input(s): HGBA1C in the last 72 hours. CBG: No results for input(s): GLUCAP in the last 168 hours. Lipid Profile: No results for input(s): CHOL, HDL, LDLCALC, TRIG, CHOLHDL, LDLDIRECT in the last 72 hours. Thyroid Function Tests:  Recent Labs  02/12/16 1213 02/12/16 1552  TSH 0.261*  --   FREET4  --  1.20*  T3FREE  --  2.0   Anemia Panel: No results for input(s): VITAMINB12, FOLATE, FERRITIN, TIBC, IRON,  RETICCTPCT in the last 72 hours. Urine analysis:    Component Value Date/Time   COLORURINE YELLOW 02/12/2016 [place] 02/12/2016 1246   LABSPEC 1.006 02/12/2016 1246   PHURINE 5.5 02/12/2016 1246   GLUCOSEU NEGATIVE 02/12/2016 1246   HGBUR NEGATIVE 02/12/2016 1246   BILIRUBINUR NEGATIVE 02/12/2016 1246   KETONESUR 40 (A) 02/12/2016 1246   PROTEINUR NEGATIVE 02/12/2016 1246   UROBILINOGEN 0.2 11/18/2011 1015   NITRITE NEGATIVE 02/12/2016 [place] 02/12/2016 1246    ) Recent Results (from the past 240 hour(s))  Culture, blood (routine x 2)     Status: None (Preliminary result)   Collection Time: 02/12/16 12:20 PM  Result Value Ref Range Status   Specimen Description BLOOD RIGHT ANTECUBITAL  Final   Special Requests BOTTLES DRAWN AEROBIC AND ANAEROBIC  10CC  Final   Culture NO GROWTH < 24 HOURS  Final   Report Status PENDING  Incomplete  Culture, blood (routine x 2)     Status: None (Preliminary result)   Collection Time: 02/12/16 12:30 PM  Result Value Ref Range Status   Specimen Description BLOOD RIGHT HAND  Final   Special Requests BOTTLES DRAWN AEROBIC AND ANAEROBIC  5CC  Final   Culture NO GROWTH < 24 HOURS  Final   Report Status PENDING  Incomplete      Anti-infectives    Start     Dose/Rate Route Frequency Ordered Stop   02/12/16 2200  minocycline (MINOCIN,DYNACIN) capsule 50 mg  Status:  Discontinued     50 mg Oral 2 times daily 02/12/16 1619 02/12/16 1632       Radiology Studies: Dg Chest 2 View  Result Date: 02/12/2016 CLINICAL DATA:  Dyspnea. EXAM: CHEST  2 VIEW COMPARISON:  None. FINDINGS: The heart size and mediastinal contours are within normal limits. Both lungs are clear. No pneumothorax or pleural effusion is noted. The visualized skeletal structures are unremarkable. IMPRESSION: No active cardiopulmonary disease. Electronically Signed   By: Marijo Conception, M.D.   On: 02/12/2016 13:27   Ct Angio Chest Pe W/cm &/or  Wo Cm  Result Date: 02/12/2016 CLINICAL DATA:  Dyspnea and elevated D-dimer. Assess pulmonary embolism and/or aortic dissection. EXAM: CT ANGIOGRAPHY CHEST WITH CONTRAST TECHNIQUE: Multidetector CT imaging of the chest was performed using the standard protocol during bolus administration of intravenous contrast. Multiplanar CT image reconstructions and MIPs were obtained to evaluate the vascular anatomy. CONTRAST:  100 mL Isovue 370 COMPARISON:  None. FINDINGS: Cardiovascular: Overall heart size is within normal limits, however there is circumferential left ventricular wall thickening. No aortic valvular calcifications appreciated. Negative for a pleural effusion.  The thoracic aorta is well opacified with contrast and is normal in caliber. Negative for luminal intimal flap. No findings to suggest dissection. Negative for mediastinal hematoma. The pulmonary arterial tree is well opacified with contrast. The central pulmonary arteries are normal in size and enhancement. No filling defects are identified within the pulmonary arteries to suggest embolism. Mediastinum/Nodes: Negative for mediastinal, hilar, or supraclavicular lymphadenopathy. Lungs/Pleura: There is mild generalized mosaic attenuation of the lungs, suggesting some areas of air trapping, nonspecific, but can be seen in the setting of small airways disease. Negative for consolidation, edema, or mass. Upper Abdomen: No acute abnormality. Musculoskeletal: No chest wall abnormality. No acute or significant osseous findings. Other:  No breast masses identified. Review of the MIP images confirms the above findings. IMPRESSION: 1. Negative for acute pulmonary embolism. 2. Negative for aortic dissection or aneurysm. 3. Circumferential left ventricular wall thickening 4. Mosaic attenuation of the lungs, suggesting scattered areas of air trapping. No airspace disease or acute abnormality identified. Electronically Signed   By: Curlene Dolphin M.D.   On: 02/12/2016  15:01   US Abdomen Limited Ruq  Result Date: 02/13/2016 CLINICAL DATA:  Elevated LFTs.  History of hypertension. EXAM: US ABDOMEN LIMITED - RIGHT UPPER QUADRANT COMPARISON:  None. FINDINGS: Gallbladder: Normal sonographic appearance of the gallbladder. No gallbladder wall thickening or pericholecystic fluid. No echogenic gallbladder stones or biliary sludge. Negative sonographic Murphy's sign. Common bile duct: Diameter: [place] size measuring 2.1 mm in diameter Liver: Normal sonographic appearance of the liver. No discrete hepatic lesions. No intrahepatic bili duct dilatation. No ascites. IMPRESSION: No explanation for patient's elevated LFTs. Electronically Signed   By: Sandi Mariscal M.D.   On: 02/13/2016 11:16        Scheduled Meds: . calcium-vitamin D  1 tablet Oral Q breakfast  . enoxaparin (LOVENOX) injection  40 mg Subcutaneous Q24H  . estradiol  0.5 mg Oral Daily  . fluticasone  2 spray Each Nare Daily  . [START ON 02/14/2016] levothyroxine  100 mcg Oral QAC breakfast  . lisinopril  10 mg Oral Daily  . loratadine  10 mg Oral Daily  . metoprolol tartrate  25 mg Oral Q8H  . montelukast  10 mg Oral QHS  . mupirocin ointment   Topical BID  . sodium chloride flush  3 mL Intravenous Q12H   Continuous Infusions:    LOS: 1 day    Time spent:25 min    JESSICA Alison Stalling, DO [facility] Pager [phone]  If 7PM-7AM, please contact night-coverage [email or web address] Password [email or web address] 02/13/2016, 1:06 PM

## 2016-02-13 NOTE — Progress Notes (Signed)
Patient Name: Kelly Myers Date of Encounter: 02/13/2016  Primary Cardiologist: New - Dr. [facility] Problem List     Principal Problem:   Elevated troponin Active Problems:   Hypertension   Tachycardia   Abnormal EKG   Elevated transaminase level   Elevated sed rate   Hypothyroid   Rash and nonspecific skin eruption     Subjective    Feels much better, denies chest pain and palpitations.   Inpatient Medications    Scheduled Meds: . sodium chloride   Intravenous STAT  . calcium-vitamin D  1 tablet Oral Q breakfast  . enoxaparin (LOVENOX) injection  40 mg Subcutaneous Q24H  . estradiol  0.5 mg Oral Daily  . fluticasone  2 spray Each Nare Daily  . lisinopril  10 mg Oral Daily  . loratadine  10 mg Oral Daily  . metoprolol tartrate  25 mg Oral Q8H  . montelukast  10 mg Oral QHS  . mupirocin ointment   Topical BID  . sodium chloride flush  3 mL Intravenous Q12H   Continuous Infusions:   PRN Meds: acetaminophen **OR** acetaminophen, HYDROcodone-acetaminophen, ondansetron **OR** ondansetron (ZOFRAN) IV, senna-docusate   Vital Signs    Vitals:   02/12/16 1825 02/12/16 1856 02/13/16 0415 02/13/16 0631  BP: 131/81 139/90 122/72   Pulse: 111 (!) 113 (!) 103 (!) 108  Resp: 10 14    Temp:  98.5 F (36.9 C) 99.4 F (37.4 C)   TempSrc:  Oral Oral   SpO2: 99% 99% 96%   Weight:  127 lb 3.2 oz (57.7 kg) 129 lb 4.8 oz (58.7 kg)   Height:  _0  (1.626 m)      Intake/Output Summary (Last 24 hours) at 02/13/16 0752 Last data filed at 02/13/16 0600  Gross per 24 hour  Intake          1245.83 ml  Output                0 ml  Net          1245.83 ml   Filed Weights   02/12/16 1856 02/13/16 0415  Weight: 127 lb 3.2 oz (57.7 kg) 129 lb 4.8 oz (58.7 kg)    Physical Exam   GEN: Well nourished, well developed, in no acute distress.  HEENT: Grossly normal.  Neck: Supple, no JVD, carotid bruits, or masses. Cardiac: RRR, no murmurs, rubs, or gallops. No clubbing,  cyanosis, edema.  Radials/DP/PT 2+ and equal bilaterally.  Respiratory:  Respirations regular and unlabored, clear to auscultation bilaterally. GI: Soft, nontender, nondistended, BS + x 4. MS: no deformity or atrophy. Skin: warm and dry, no rash. Neuro:  Strength and sensation are intact. Psych: AAOx3.  Normal affect.  Labs    CBC  Recent Labs  02/12/16 1220 02/13/16 0519  WBC 8.0 5.5  NEUTROABS 6.2  --   HGB 12.2 11.1*  HCT 36.0 33.5*  MCV 87.6 88.4  PLT 152 478*   Basic Metabolic Panel  Recent Labs  02/12/16 1220 02/13/16 0519  NA 136 140  K 3.8 3.8  CL 104 110  CO2 23 24  GLUCOSE 132* 112*  BUN <5* <5*  CREATININE 0.67 0.56  CALCIUM 9.0 8.6*   Liver Function Tests  Recent Labs  02/12/16 1220 02/13/16 0519  AST 51* 53*  ALT 89* 80*  ALKPHOS 205* 190*  BILITOT 1.7* 1.0  PROT 6.3* 5.7*  ALBUMIN 3.4* 2.9*   Cardiac Enzymes  Recent Labs  02/12/16  1220 02/12/16 1800 02/12/16 2138  TROPONINI 0.13* 0.12* 0.12*   D-Dimer  Recent Labs  02/12/16 1220  DDIMER 0.97*   Thyroid Function Tests  Recent Labs  02/12/16 1213 02/12/16 1552  TSH 0.261*  --   T3FREE  --  2.0    Telemetry     NSR, with some rates in 110's- Personally Reviewed  ECG     NSR - Personally Reviewed  Radiology    Dg Chest 2 View  Result Date: 02/12/2016 CLINICAL DATA:  Dyspnea. EXAM: CHEST  2 VIEW COMPARISON:  None. FINDINGS: The heart size and mediastinal contours are within normal limits. Both lungs are clear. No pneumothorax or pleural effusion is noted. The visualized skeletal structures are unremarkable. IMPRESSION: No active cardiopulmonary disease. Electronically Signed   By: Marijo Conception, M.D.   On: 02/12/2016 13:27   Ct Angio Chest Pe W/cm &/or Wo Cm  Result Date: 02/12/2016 CLINICAL DATA:  Dyspnea and elevated D-dimer. Assess pulmonary embolism and/or aortic dissection. EXAM: CT ANGIOGRAPHY CHEST WITH CONTRAST TECHNIQUE: Multidetector CT imaging of the  chest was performed using the standard protocol during bolus administration of intravenous contrast. Multiplanar CT image reconstructions and MIPs were obtained to evaluate the vascular anatomy. CONTRAST:  100 mL Isovue 370 COMPARISON:  None. FINDINGS: Cardiovascular: Overall heart size is within normal limits, however there is circumferential left ventricular wall thickening. No aortic valvular calcifications appreciated. Negative for a pleural effusion. The thoracic aorta is well opacified with contrast and is normal in caliber. Negative for luminal intimal flap. No findings to suggest dissection. Negative for mediastinal hematoma. The pulmonary arterial tree is well opacified with contrast. The central pulmonary arteries are normal in size and enhancement. No filling defects are identified within the pulmonary arteries to suggest embolism. Mediastinum/Nodes: Negative for mediastinal, hilar, or supraclavicular lymphadenopathy. Lungs/Pleura: There is mild generalized mosaic attenuation of the lungs, suggesting some areas of air trapping, nonspecific, but can be seen in the setting of small airways disease. Negative for consolidation, edema, or mass. Upper Abdomen: No acute abnormality. Musculoskeletal: No chest wall abnormality. No acute or significant osseous findings. Other:  No breast masses identified. Review of the MIP images confirms the above findings. IMPRESSION: 1. Negative for acute pulmonary embolism. 2. Negative for aortic dissection or aneurysm. 3. Circumferential left ventricular wall thickening 4. Mosaic attenuation of the lungs, suggesting scattered areas of air trapping. No airspace disease or acute abnormality identified. Electronically Signed   By: Curlene Dolphin M.D.   On: 02/12/2016 15:01    Cardiac Studies   Echo pending  Patient Profile     60 yo female with PMH of asthma, endometriosis, HTN, and hypothyroidism who presented to the ED with reports of generalized fatigue and neck pain.  Found to be in sinus tach with rate of 130's, troponin mildly elevated with a flat trend.  Assessment & Plan   1. Elevated troponin: She denies any history of CAD, but does have hypertension that appears to be well-controlled. She walks on a regular basis, many times walking several miles without any anginal symptoms or dyspnea. Troponin elevated with a flat trend, not representative of ACS.   2. HTN: Well controlled:  3. Tachycardia: Reports generalized fatigue over the past couple of days, question whether there is a viral component related to this as she has no complaints of cough, or congestion but yet has fever. She recently had her flu shot. Has been on minocycline for nonspecific perioral rash for the past 2  weeks. She's had trouble with antibiotics in the past causing palpitations. Metoprolol initiated yesterday, HR is better controlled, still with some rates in the 100's-110's. Can increase metoprolol to 42m q 8 hours.   4. Elevated LFTs and Alk phos: Denies any abd pain. For abdominal ultrasound today.   Signed, EArbutus Leas NP  02/13/2016, 7:52 AM    Patient seen and examined. Agree with assessment and plan. Feels better. She has received 2 doses of metoprolol 25 mg; HR now 88 but was 100 on ecg this am.  TSH 0.261; may need to reduce levothyroxine from her 112 ug dose to 100 ug, but will have primary team adjust. For abd UKoreatoday. No chest pain.  ESR 50. Doublt ACS in mild troponin elevation.   TTroy Sine MD, FWhite Fence Surgical Suites11/05/2015 9:56 AM

## 2016-02-14 DIAGNOSIS — R748 Abnormal levels of other serum enzymes: Secondary | ICD-10-CM | POA: Diagnosis not present

## 2016-02-14 DIAGNOSIS — I10 Essential (primary) hypertension: Secondary | ICD-10-CM | POA: Diagnosis not present

## 2016-02-14 DIAGNOSIS — E039 Hypothyroidism, unspecified: Secondary | ICD-10-CM | POA: Diagnosis not present

## 2016-02-14 DIAGNOSIS — R000 Tachycardia, unspecified: Secondary | ICD-10-CM | POA: Diagnosis not present

## 2016-02-14 LAB — COMPREHENSIVE METABOLIC PANEL
ALT: 70 U/L — ABNORMAL HIGH (ref 14–54)
ANION GAP: 6 (ref 5–15)
AST: 38 U/L (ref 15–41)
Albumin: 2.8 g/dL — ABNORMAL LOW (ref 3.5–5.0)
Alkaline Phosphatase: 187 U/L — ABNORMAL HIGH (ref 38–126)
BILIRUBIN TOTAL: 0.8 mg/dL (ref 0.3–1.2)
CO2: 25 mmol/L (ref 22–32)
Calcium: 9.1 mg/dL (ref 8.9–10.3)
Chloride: 108 mmol/L (ref 101–111)
Creatinine, Ser: 0.57 mg/dL (ref 0.44–1.00)
Glucose, Bld: 110 mg/dL — ABNORMAL HIGH (ref 65–99)
POTASSIUM: 4 mmol/L (ref 3.5–5.1)
Sodium: 139 mmol/L (ref 135–145)
TOTAL PROTEIN: 5.7 g/dL — AB (ref 6.5–8.1)

## 2016-02-14 LAB — HEPATITIS PANEL, ACUTE
HCV Ab: 0.1 s/co ratio (ref 0.0–0.9)
HEP A IGM: NEGATIVE
HEP B C IGM: NEGATIVE
Hepatitis B Surface Ag: NEGATIVE

## 2016-02-14 LAB — CBC
HEMATOCRIT: 32.1 % — AB (ref 36.0–46.0)
Hemoglobin: 10.6 g/dL — ABNORMAL LOW (ref 12.0–15.0)
MCH: 29.3 pg (ref 26.0–34.0)
MCHC: 33 g/dL (ref 30.0–36.0)
MCV: 88.7 fL (ref 78.0–100.0)
Platelets: 159 10*3/uL (ref 150–400)
RBC: 3.62 MIL/uL — ABNORMAL LOW (ref 3.87–5.11)
RDW: 13.2 % (ref 11.5–15.5)
WBC: 5.4 10*3/uL (ref 4.0–10.5)

## 2016-02-14 MED ORDER — METOPROLOL TARTRATE 25 MG PO TABS: 25.0000 mg | ORAL_TABLET | Freq: Three times a day (TID) | ORAL | 0 refills | Status: DC

## 2016-02-14 MED ORDER — LEVOTHYROXINE SODIUM 100 MCG PO TABS: 100.0000 ug | ORAL_TABLET | Freq: Every day | ORAL | 0 refills | Status: AC

## 2016-02-14 NOTE — Discharge Summary (Addendum)
Physician Discharge Summary  Kelly Myers FBP:[id number] DOB: 05-Feb-1956 DOA: 02/12/2016  PCP: Kelly Heck, MD  Admit date: 02/12/2016 Discharge date: 02/14/2016  Time spent: 45 minutes  Recommendations for Outpatient Follow-up:  Patient will be discharged to home.  Patient will need to follow up with primary care provider within one week of discharge and repeat CMP.  Repeat thyroid testing in 4-6 weeks.  Patient should continue medications as prescribed.  Patient should follow a heart healthy diet.   Discharge Diagnoses:  Elevated troponin Tachycardia Elevated transaminase Hypertension Hypothyroidism  Discharge Condition: Stable  Diet recommendation: heart healthy  Filed Weights   02/12/16 1856 02/13/16 0415 02/14/16 0500  Weight: 57.7 kg (127 lb 3.2 oz) 58.7 kg (129 lb 4.8 oz) 57.4 kg (126 lb 9.6 oz)    History of present illness:  On 02/12/2016 by Ms. Kelly Carrel, NP [name] is a 60 y.o. female with medical history significant of asthma, endometriosis, hypertension and hypothyroidism presents to the emergency department with the chief complaint of generalized fatigue and neck pain  Information is obtained from the patient. She states 3 days ago he developed some neck pain and generally did not feel well. She's been on antibiotics for 2 weeks for rash across her mouth and lower face prescribed by Dr. Denna Myers her dermatologist.  He states her symptoms got worse over the next 2 days she developed chills and bodyaches subjective fever. She went to her PCP today and was noted to be febrile with temperature 100.4 and tachycardic with a heart rate of 140. She was transported to the emergency department for further evaluation. She denies chest pain palpitations headache dizziness syncope or near-syncope. She denies shortness of breath cough lower extremity edema or orthopnea. She denies dysuria hematuria frequency or urgency. She denies abdominal pain nausea vomiting  diarrhea constipation. She does report palpitations in the past with basic antibiotic is not sure which one. She feels like be on antibiotics she's been on for the last 2 weeks be related.  Hospital Course:  Elevated troponin -Remained flat -No history of CAD -Cardiology consulted and appreciated- did not feel this was representative of ACS -Echocardiogram EF 82-42, grade 2 diastolic dysfunction -CTA Chest negative for PE  Tachycardia -TSH 0.261, FT4 1.2, T3 2, Thyroperoxidase Ab 11 -Synthroid dose decreased -Patient will need to have repeat testing in 4-6 weeks -She follows with Dr. Forde Myers -Continue metoprolol (Addendum: patient called today stating that she felt she was having a reaction to the metoprolol.  She called her PCP and was referred back to [facility]. States she felt bad prior to coming the hospital, felt she was going to get a cold. Patient was started on metoprolol during hospitalization and was discharged with the medication due to tachycardia. Explained to patient that while metoprolol is not known to cause cough, certainly can cause bronchospasms and would not be a medication given during a COPD exacerbation, etc.  Advised patient to take a 1/2 tablet BID, and to follow up with PCP.)  Elevated transaminases -Patient reports daily glass of wine with dinner -LFTS, bili, alk phos all trending downward -U/S negative -hepatitis panel unremarkable  Hypertension -Controlled -Continue lisinopril and metoprolol  Hypothyroidism -see discuss above -Continue synthroid- reduced dose  THC abuse -Counseled  Procedures: Echocardiogram  Consultations: Cardiology  Discharge Exam: Vitals:   02/14/16 0500 02/14/16 0734  BP: 119/62 136/76  Pulse: 85   Resp: 18   Temp: 98.8 F (37.1 C)      General: Well  developed, well nourished, NAD, appears younger than stated age  20: NCAT,mucous membranes moist.  Cardiovascular: S1 S2 auscultated, no rubs, murmurs or gallops.  Regular rate and rhythm.  Respiratory: Clear to auscultation bilaterally with equal chest rise  Abdomen: Soft, nontender, nondistended, + bowel sounds  Extremities: warm dry without cyanosis clubbing or edema  Neuro: AAOx3, nonfocal  Skin: Without rashes exudates or nodules  Psych: Normal affect and demeanor with intact judgement and insight, pleasant  Discharge Instructions Discharge Instructions    Discharge instructions    Complete by:  As directed    Patient will be discharged to home.  Patient will need to follow up with primary care provider within one week of discharge and repeat CMP.  Repeat thyroid testing in 4-6 weeks.  Patient should continue medications as prescribed.  Patient should follow a heart healthy diet.     Current Discharge Medication List    START taking these medications   Details  metoprolol tartrate (LOPRESSOR) 25 MG tablet Take 1 tablet (25 mg total) by mouth every 8 (eight) hours. Qty: 90 tablet, Refills: 0      CONTINUE these medications which have CHANGED   Details  levothyroxine (SYNTHROID, LEVOTHROID) 100 MCG tablet Take 1 tablet (100 mcg total) by mouth daily before breakfast. Qty: 30 tablet, Refills: 0      CONTINUE these medications which have NOT CHANGED   Details  Calcium Carbonate-Vitamin D (CALCIUM + D PO) Take 1 tablet by mouth daily.     Cetirizine HCl (ZYRTEC ALLERGY) 10 MG CAPS Take 10 mg by mouth daily.     Cyanocobalamin (VITAMIN B-12 PO) Take 1 tablet by mouth daily.    estradiol (ESTRACE) 0.5 MG tablet Take 1 tablet (0.5 mg total) by mouth daily. Qty: 90 tablet, Refills: 4   Associated Diagnoses: Postmenopausal HRT (hormone replacement therapy)    Estradiol (VAGIFEM) 10 MCG TABS vaginal tablet Place 1 tablet (10 mcg total) vaginally 2 (two) times a week. Monday and Friday Qty: 24 tablet, Refills: 4   Associated Diagnoses: Post-menopausal atrophic vaginitis    fluticasone (FLONASE) 50 MCG/ACT nasal spray Place 2 sprays  into both nostrils daily.    glucosamine-chondroitin 500-400 MG tablet Take 1 tablet by mouth daily.     lisinopril (PRINIVIL,ZESTRIL) 10 MG tablet Take 10 mg by mouth daily.      montelukast (SINGULAIR) 10 MG tablet Take 10 mg by mouth at bedtime.      Omega-3 Fatty Acids (FISH OIL PO) Take 1 capsule by mouth daily.    Probiotic Product (PROBIOTIC DAILY PO) Take 1 capsule by mouth daily.     Pyridoxine HCl (VITAMIN B-6 PO) Take 1 tablet by mouth daily.      STOP taking these medications     minocycline (MINOCIN,DYNACIN) 50 MG capsule        Allergies  Allergen Reactions  . Macrodantin [Nitrofurantoin] Hives  . Neosporin [Neomycin-Bacitracin Zn-Polymyx] Rash   Follow-up Information    Kelly Heck, MD. Schedule an appointment as soon as possible for a visit in 1 week(s).   Specialty:  Family Medicine Why:  Hospital follow up Contact information: [address] [id number] [phone]            The results of significant diagnostics from this hospitalization (including imaging, microbiology, ancillary and laboratory) are listed below for reference.    Significant Diagnostic Studies: Dg Chest 2 View  Result Date: 02/12/2016 CLINICAL DATA:  Dyspnea. EXAM: CHEST  2 VIEW COMPARISON:  None. FINDINGS: The heart size and mediastinal contours are within normal limits. Both lungs are clear. No pneumothorax or pleural effusion is noted. The visualized skeletal structures are unremarkable. IMPRESSION: No active cardiopulmonary disease. Electronically Signed   By: Marijo Conception, M.D.   On: 02/12/2016 13:27   Ct Angio Chest Pe W/cm &/or Wo Cm  Result Date: 02/12/2016 CLINICAL DATA:  Dyspnea and elevated D-dimer. Assess pulmonary embolism and/or aortic dissection. EXAM: CT ANGIOGRAPHY CHEST WITH CONTRAST TECHNIQUE: Multidetector CT imaging of the chest was performed using the standard protocol during bolus administration of intravenous contrast.  Multiplanar CT image reconstructions and MIPs were obtained to evaluate the vascular anatomy. CONTRAST:  100 mL Isovue 370 COMPARISON:  None. FINDINGS: Cardiovascular: Overall heart size is within normal limits, however there is circumferential left ventricular wall thickening. No aortic valvular calcifications appreciated. Negative for a pleural effusion. The thoracic aorta is well opacified with contrast and is normal in caliber. Negative for luminal intimal flap. No findings to suggest dissection. Negative for mediastinal hematoma. The pulmonary arterial tree is well opacified with contrast. The central pulmonary arteries are normal in size and enhancement. No filling defects are identified within the pulmonary arteries to suggest embolism. Mediastinum/Nodes: Negative for mediastinal, hilar, or supraclavicular lymphadenopathy. Lungs/Pleura: There is mild generalized mosaic attenuation of the lungs, suggesting some areas of air trapping, nonspecific, but can be seen in the setting of small airways disease. Negative for consolidation, edema, or mass. Upper Abdomen: No acute abnormality. Musculoskeletal: No chest wall abnormality. No acute or significant osseous findings. Other:  No breast masses identified. Review of the MIP images confirms the above findings. IMPRESSION: 1. Negative for acute pulmonary embolism. 2. Negative for aortic dissection or aneurysm. 3. Circumferential left ventricular wall thickening 4. Mosaic attenuation of the lungs, suggesting scattered areas of air trapping. No airspace disease or acute abnormality identified. Electronically Signed   By: Curlene Dolphin M.D.   On: 02/12/2016 15:01   US Abdomen Limited Ruq  Result Date: 02/13/2016 CLINICAL DATA:  Elevated LFTs.  History of hypertension. EXAM: US ABDOMEN LIMITED - RIGHT UPPER QUADRANT COMPARISON:  None. FINDINGS: Gallbladder: Normal sonographic appearance of the gallbladder. No gallbladder wall thickening or pericholecystic fluid. No  echogenic gallbladder stones or biliary sludge. Negative sonographic Murphy's sign. Common bile duct: Diameter: Norman size measuring 2.1 mm in diameter Liver: Normal sonographic appearance of the liver. No discrete hepatic lesions. No intrahepatic bili duct dilatation. No ascites. IMPRESSION: No explanation for patient's elevated LFTs. Electronically Signed   By: Sandi Mariscal M.D.   On: 02/13/2016 11:16    Microbiology: Recent Results (from the past 240 hour(s))  Culture, blood (routine x 2)     Status: None (Preliminary result)   Collection Time: 02/12/16 12:20 PM  Result Value Ref Range Status   Specimen Description BLOOD RIGHT ANTECUBITAL  Final   Special Requests BOTTLES DRAWN AEROBIC AND ANAEROBIC  10CC  Final   Culture NO GROWTH < 24 HOURS  Final   Report Status PENDING  Incomplete  Culture, blood (routine x 2)     Status: None (Preliminary result)   Collection Time: 02/12/16 12:30 PM  Result Value Ref Range Status   Specimen Description BLOOD RIGHT HAND  Final   Special Requests BOTTLES DRAWN AEROBIC AND ANAEROBIC  5CC  Final   Culture NO GROWTH < 24 HOURS  Final   Report Status PENDING  Incomplete     Labs: Basic Metabolic Panel:  Recent Labs Lab 02/12/16 1220 02/13/16  3810 02/14/16 0223  NA 136 140 139  K 3.8 3.8 4.0  CL 104 110 108  CO2 _0 GLUCOSE 132* 112* 110*  BUN <5* <5* <5*  CREATININE 0.67 0.56 0.57  CALCIUM 9.0 8.6* 9.1   Liver Function Tests:  Recent Labs Lab 02/12/16 1220 02/13/16 0519 02/14/16 0223  AST 51* 53* 38  ALT 89* 80* 70*  ALKPHOS 205* 190* 187*  BILITOT 1.7* 1.0 0.8  PROT 6.3* 5.7* 5.7*  ALBUMIN 3.4* 2.9* 2.8*   No results for input(s): LIPASE, AMYLASE in the last 168 hours. No results for input(s): AMMONIA in the last 168 hours. CBC:  Recent Labs Lab 02/12/16 1220 02/13/16 0519 02/14/16 0223  WBC 8.0 5.5 5.4  NEUTROABS 6.2  --   --   HGB 12.2 11.1* 10.6*  HCT 36.0 33.5* 32.1*  MCV 87.6 88.4 88.7  PLT 152 146* 159     Cardiac Enzymes:  Recent Labs Lab 02/12/16 1220 02/12/16 1800 02/12/16 2138  TROPONINI 0.13* 0.12* 0.12*   BNP: BNP (last 3 results)  Recent Labs  02/12/16 1220  BNP 263.0*    ProBNP (last 3 results) No results for input(s): PROBNP in the last 8760 hours.  CBG: No results for input(s): GLUCAP in the last 168 hours.     SignedCristal Ford  [facility] 02/14/2016, 11:05 AM

## 2016-02-14 NOTE — Discharge Instructions (Signed)
Hypothyroidism Hypothyroidism is a disorder of the thyroid. The thyroid is a large gland that is located in the lower front of the neck. The thyroid releases hormones that control how the body works. With hypothyroidism, the thyroid does not make enough of these hormones. CAUSES Causes of hypothyroidism may include:  Viral infections.  Pregnancy.  Your own defense system (immune system) attacking your thyroid.  Certain medicines.  Birth defects.  Past radiation treatments to your head or neck.  Past treatment with radioactive iodine.  Past surgical removal of part or all of your thyroid.  Problems with the gland that is located in the center of your brain (pituitary). SIGNS AND SYMPTOMS Signs and symptoms of hypothyroidism may include:  Feeling as though you have no energy (lethargy).  Inability to tolerate cold.  Weight gain that is not explained by a change in diet or exercise habits.  Dry skin.  Coarse hair.  Menstrual irregularity.  Slowing of thought processes.  Constipation.  Sadness or depression. DIAGNOSIS  Your health care provider may diagnose hypothyroidism with blood tests and ultrasound tests. TREATMENT Hypothyroidism is treated with medicine that replaces the hormones that your body does not make. After you begin treatment, it may take several weeks for symptoms to go away. HOME CARE INSTRUCTIONS   Take medicines only as directed by your health care provider.  If you start taking any new medicines, tell your health care provider.  Keep all follow-up visits as directed by your health care provider. This is important. As your condition improves, your dosage needs may change. You will need to have blood tests regularly so that your health care provider can watch your condition. SEEK MEDICAL CARE IF:  Your symptoms do not get better with treatment.  You are taking thyroid replacement medicine and:  You sweat excessively.  You have tremors.  You  feel anxious.  You lose weight rapidly.  You cannot tolerate heat.  You have emotional swings.  You have diarrhea.  You feel weak. SEEK IMMEDIATE MEDICAL CARE IF:   You develop chest pain.  You develop an irregular heartbeat.  You develop a rapid heartbeat.   This information is not intended to replace advice given to you by your health care provider. Make sure you discuss any questions you have with your health care provider.   Document Released: 03/30/2005 Document Revised: 04/20/2014 Document Reviewed: 08/15/2013 Elsevier Interactive Patient Education 2016 Elsevier Inc. Nonspecific Tachycardia Tachycardia is a faster than normal heartbeat (more than 100 beats per minute). In adults, the heart normally beats between 60 and 100 times a minute. A fast heartbeat may be a normal response to exercise or stress. It does not necessarily mean that something is wrong. However, sometimes when your heart beats too fast it may not be able to pump enough blood to the rest of your body. This can result in chest pain, shortness of breath, dizziness, and even fainting. Nonspecific tachycardia means that the specific cause or pattern of your tachycardia is unknown. CAUSES  Tachycardia may be harmless or it may be due to a more serious underlying cause. Possible causes of tachycardia include:  Exercise or exertion.  Fever.  Pain or injury.  Infection.  Loss of body fluids (dehydration).  Overactive thyroid.  Lack of red blood cells (anemia).  Anxiety and stress.  Alcohol.  Caffeine.  Tobacco products.  Diet pills.  Illegal drugs.  Heart disease. SYMPTOMS  Rapid or irregular heartbeat (palpitations).  Suddenly feeling your heart beating (cardiac awareness).  Dizziness.  Tiredness (fatigue).  Shortness of breath.  Chest pain.  Nausea.  Fainting. DIAGNOSIS  Your caregiver will perform a physical exam and take your medical history. In some cases, a heart  specialist (cardiologist) may be consulted. Your caregiver may also order:  Blood tests.  Electrocardiography. This test records the electrical activity of your heart.  A heart monitoring test. TREATMENT  Treatment will depend on the likely cause of your tachycardia. The goal is to treat the underlying cause of your tachycardia. Treatment methods may include:  Replacement of fluids or blood through an intravenous (IV) tube for moderate to severe dehydration or anemia.  New medicines or changes in your current medicines.  Diet and lifestyle changes.  Treatment for certain infections.  Stress relief or relaxation methods. HOME CARE INSTRUCTIONS   Rest.  Drink enough fluids to keep your urine clear or pale yellow.  Do not smoke.  Avoid:  Caffeine.  Tobacco.  Alcohol.  Chocolate.  Stimulants such as over-the-counter diet pills or pills that help you stay awake.  Situations that cause anxiety or stress.  Illegal drugs such as marijuana, phencyclidine (PCP), and cocaine.  Only take medicine as directed by your caregiver.  Keep all follow-up appointments as directed by your caregiver. SEEK IMMEDIATE MEDICAL CARE IF:   You have pain in your chest, upper arms, jaw, or neck.  You become weak, dizzy, or feel faint.  You have palpitations that will not go away.  You vomit, have diarrhea, or pass blood in your stool.  Your skin is cool, pale, and wet.  You have a fever that will not go away with rest, fluids, and medicine. MAKE SURE YOU:   Understand these instructions.  Will watch your condition.  Will get help right away if you are not doing well or get worse.   This information is not intended to replace advice given to you by your health care provider. Make sure you discuss any questions you have with your health care provider.   Document Released: 05/07/2004 Document Revised: 06/22/2011 Document Reviewed: 10/12/2014 Elsevier Interactive Patient Education  [employer].

## 2016-02-14 NOTE — Progress Notes (Signed)
Patient Name: Kelly Myers Date of Encounter: 02/14/2016  Primary Cardiologist: New, Dr. [facility] Problem List     Principal Problem:   Elevated troponin Active Problems:   Hypertension   Tachycardia   Abnormal EKG   Transaminitis   Elevated sed rate   Hypothyroid   Rash and nonspecific skin eruption     Subjective   Feels well, denies chest pain and SOB.   Inpatient Medications    Scheduled Meds: . calcium-vitamin D  1 tablet Oral Q breakfast  . enoxaparin (LOVENOX) injection  40 mg Subcutaneous Q24H  . estradiol  0.5 mg Oral Daily  . fluticasone  2 spray Each Nare Daily  . levothyroxine  100 mcg Oral QAC breakfast  . lisinopril  10 mg Oral Daily  . loratadine  10 mg Oral Daily  . metoprolol tartrate  25 mg Oral Q8H  . montelukast  10 mg Oral QHS  . mupirocin ointment   Topical BID  . sodium chloride flush  3 mL Intravenous Q12H   Continuous Infusions:   PRN Meds: acetaminophen **OR** acetaminophen, HYDROcodone-acetaminophen, ondansetron **OR** ondansetron (ZOFRAN) IV, senna-docusate   Vital Signs    Vitals:   02/13/16 1441 02/13/16 2031 02/14/16 0500 02/14/16 0734  BP: 128/84 135/80 119/62 136/76  Pulse: 81 88 85   Resp: 10 20 18    Temp: 98.2 F (36.8 C) 98.8 F (37.1 C) 98.8 F (37.1 C)   TempSrc: Oral Oral Oral   SpO2:  99% 95%   Weight:   126 lb 9.6 oz (57.4 kg)   Height:        Intake/Output Summary (Last 24 hours) at 02/14/16 0832 Last data filed at 02/13/16 2230  Gross per 24 hour  Intake              600 ml  Output                0 ml  Net              600 ml   Filed Weights   02/12/16 1856 02/13/16 0415 02/14/16 0500  Weight: 127 lb 3.2 oz (57.7 kg) 129 lb 4.8 oz (58.7 kg) 126 lb 9.6 oz (57.4 kg)    Physical Exam    GEN: Well nourished, well developed, in no acute distress.  HEENT: Grossly normal.  Neck: Supple, no JVD, carotid bruits, or masses. Cardiac: RRR, no murmurs, rubs, or gallops. No clubbing, cyanosis,  edema.  Radials/DP/PT 2+ and equal bilaterally.  Respiratory:  Respirations regular and unlabored, clear to auscultation bilaterally. GI: Soft, nontender, nondistended, BS + x 4. MS: no deformity or atrophy. Skin: warm and dry, no rash. Neuro:  Strength and sensation are intact. Psych: AAOx3.  Normal affect.  Labs    CBC  Recent Labs  02/12/16 1220 02/13/16 0519 02/14/16 0223  WBC 8.0 5.5 5.4  NEUTROABS 6.2  --   --   HGB 12.2 11.1* 10.6*  HCT 36.0 33.5* 32.1*  MCV 87.6 88.4 88.7  PLT 152 146* 034   Basic Metabolic Panel  Recent Labs  02/13/16 0519 02/14/16 0223  NA 140 139  K 3.8 4.0  CL 110 108  CO2 24 25  GLUCOSE 112* 110*  BUN <5* <5*  CREATININE 0.56 0.57  CALCIUM 8.6* 9.1   Liver Function Tests  Recent Labs  02/13/16 0519 02/14/16 0223  AST 53* 38  ALT 80* 70*  ALKPHOS 190* 187*  BILITOT 1.0 0.8  PROT  5.7* 5.7*  ALBUMIN 2.9* 2.8*   Cardiac Enzymes  Recent Labs  02/12/16 1220 02/12/16 1800 02/12/16 2138  TROPONINI 0.13* 0.12* 0.12*    D-Dimer  Recent Labs  02/12/16 1220  DDIMER 0.97*   Thyroid Function Tests  Recent Labs  02/12/16 1213 02/12/16 1552  TSH 0.261*  --   T3FREE  --  2.0    Telemetry    NSR, frequent PAC's - Personally Reviewed  ECG    NSR - Personally Reviewed  Radiology    Dg Chest 2 View  Result Date: 02/12/2016 CLINICAL DATA:  Dyspnea. EXAM: CHEST  2 VIEW COMPARISON:  None. FINDINGS: The heart size and mediastinal contours are within normal limits. Both lungs are clear. No pneumothorax or pleural effusion is noted. The visualized skeletal structures are unremarkable. IMPRESSION: No active cardiopulmonary disease. Electronically Signed   By: Marijo Conception, M.D.   On: 02/12/2016 13:27   Ct Angio Chest Pe W/cm &/or Wo Cm  Result Date: 02/12/2016 CLINICAL DATA:  Dyspnea and elevated D-dimer. Assess pulmonary embolism and/or aortic dissection. EXAM: CT ANGIOGRAPHY CHEST WITH CONTRAST TECHNIQUE:  Multidetector CT imaging of the chest was performed using the standard protocol during bolus administration of intravenous contrast. Multiplanar CT image reconstructions and MIPs were obtained to evaluate the vascular anatomy. CONTRAST:  100 mL Isovue 370 COMPARISON:  None. FINDINGS: Cardiovascular: Overall heart size is within normal limits, however there is circumferential left ventricular wall thickening. No aortic valvular calcifications appreciated. Negative for a pleural effusion. The thoracic aorta is well opacified with contrast and is normal in caliber. Negative for luminal intimal flap. No findings to suggest dissection. Negative for mediastinal hematoma. The pulmonary arterial tree is well opacified with contrast. The central pulmonary arteries are normal in size and enhancement. No filling defects are identified within the pulmonary arteries to suggest embolism. Mediastinum/Nodes: Negative for mediastinal, hilar, or supraclavicular lymphadenopathy. Lungs/Pleura: There is mild generalized mosaic attenuation of the lungs, suggesting some areas of air trapping, nonspecific, but can be seen in the setting of small airways disease. Negative for consolidation, edema, or mass. Upper Abdomen: No acute abnormality. Musculoskeletal: No chest wall abnormality. No acute or significant osseous findings. Other:  No breast masses identified. Review of the MIP images confirms the above findings. IMPRESSION: 1. Negative for acute pulmonary embolism. 2. Negative for aortic dissection or aneurysm. 3. Circumferential left ventricular wall thickening 4. Mosaic attenuation of the lungs, suggesting scattered areas of air trapping. No airspace disease or acute abnormality identified. Electronically Signed   By: Curlene Dolphin M.D.   On: 02/12/2016 15:01   US Abdomen Limited Ruq  Result Date: 02/13/2016 CLINICAL DATA:  Elevated LFTs.  History of hypertension. EXAM: US ABDOMEN LIMITED - RIGHT UPPER QUADRANT COMPARISON:  None.  FINDINGS: Gallbladder: Normal sonographic appearance of the gallbladder. No gallbladder wall thickening or pericholecystic fluid. No echogenic gallbladder stones or biliary sludge. Negative sonographic Murphy's sign. Common bile duct: Diameter: Norman size measuring 2.1 mm in diameter Liver: Normal sonographic appearance of the liver. No discrete hepatic lesions. No intrahepatic bili duct dilatation. No ascites. IMPRESSION: No explanation for patient's elevated LFTs. Electronically Signed   By: Sandi Mariscal M.D.   On: 02/13/2016 11:16    Cardiac Studies   Transthoracic Echocardiography 02/13/16 Study Conclusions  - Left ventricle: The cavity size was normal. There was mild   concentric hypertrophy. Systolic function was normal. The   estimated ejection fraction was in the range of 55% to 60%. Wall  motion was normal; there were no regional wall motion   abnormalities. Features are consistent with a pseudonormal left   ventricular filling pattern, with concomitant abnormal relaxation   and increased filling pressure (grade 2 diastolic dysfunction).   Patient Profile     60 year old female with a past medical history of asthma, endometriosis, HTN, and hypothyroidism  Assessment & Plan    1. Elevated troponin:She denies any history of CAD, but does have hypertension that appears to be well-controlled. She walks on a regular basis, many times walking several miles without any anginal symptoms or dyspnea. Troponin elevated with a flat trend, not representative of ACS.   2. HAL:PFXT controlled:  3. Tachycardia: Reports generalized fatigue over the past couple of days, question whether there is a viral component related to this as she has no complaints of cough, or congestion but yet has fever. She recently had her flu shot. Has been on minocycline for nonspecific perioral rash for the past 2 weeks. She's had trouble with antibiotics in the past causing palpitations.   Continue metoprolol,  likely ok to discharge from cardiac standpoint.   4. Elevated LFTs and Alk phos: Denies any abd pain. Abdominal ultrasound normal    Signed, Arbutus Leas, NP  02/14/2016, 8:32 AM    Patient seen and examined. Agree with assessment and plan. Feels better.  Tolerating BB; would transition to 50 mg bid.  Abd [place] normal;  Elevated LFTs, improved but ALT still elevated at 70. ? Minocycline. Now on reduced dose of levothyroxine due to ST and TSH oversuppression.    Echo with nl LV fx with Gr 2 DD. [place] for [employer] today.  OK to arrange cardiology f/u with me in office.  Troy Sine, MD, [facility] 02/14/2016 10:19 AM

## 2016-02-14 NOTE — Plan of Care (Signed)
Problem: Education: Goal: Knowledge of [facility] Education information/materials will improve Outcome: Completed/Met Date Met: 02/14/16 Pt educated throughout entire admission regarding tests, procedures, lab, medications, and available resources.   Problem: Safety: Goal: Ability to remain free from injury will improve Outcome: Completed/Met Date Met: 02/14/16 Pt has remained free from injury during this admission   Problem: Nutrition: Goal: Adequate nutrition will be maintained Outcome: Completed/Met Date Met: 02/14/16 Pt has adequate intake and output and is able to tolerate current heart healthy diet with no difficulties.

## 2016-02-15 LAB — URINE CULTURE

## 2016-02-17 ENCOUNTER — Telehealth: Payer: Self-pay | Admitting: Cardiovascular Disease

## 2016-02-17 LAB — CULTURE, BLOOD (ROUTINE X 2)
CULTURE: NO GROWTH
CULTURE: NO GROWTH

## 2016-02-17 NOTE — Telephone Encounter (Signed)
Received a call from [place] with [facility].She stated she made a TOC call to patient this morning.Patient stated since starting on Metoprolol 02/12/16 in hospital her asthma has started back.PCP advised to call Dr.Kelly who prescribed metoprolol. Spoke to patient.She stated she was given metoprolol 25 mg three times a day in hospital 11/1.Stated she was told to take 25 mg twice a day at home.She has noticed since she started taking she has a dry cough.Stated asthma starts with a dry cough and she wanted to find out what to do before it gets worse.She is taking mucinex dm twice a day which may be helping some.She is concerned she did not have cough until she started metoprolol.Advised Dr.Kelly out of office. I will speak to DOD Dr.Crenshaw and call her back.  Spoke to DOD Dr.Crenshaw he advised to hold metoprolol.Advised to monitor heart rate.Call back if has fast heart beat.New post hospital appointment scheduled with Rosaria Ferries PA Thurs 02/20/16 at 2:00 pm.

## 2016-02-18 ENCOUNTER — Encounter: Payer: Self-pay | Admitting: Physician Assistant

## 2016-02-18 ENCOUNTER — Encounter: Payer: Self-pay | Admitting: Women's Health

## 2016-02-20 ENCOUNTER — Encounter: Payer: Self-pay | Admitting: Physician Assistant

## 2016-02-20 ENCOUNTER — Ambulatory Visit (INDEPENDENT_AMBULATORY_CARE_PROVIDER_SITE_OTHER): Payer: Managed Care, Other (non HMO) | Admitting: Physician Assistant

## 2016-02-20 VITALS — BP 138/82 | HR 91 | Ht 64.0 in | Wt 124.4 lb

## 2016-02-20 DIAGNOSIS — R7989 Other specified abnormal findings of blood chemistry: Secondary | ICD-10-CM

## 2016-02-20 DIAGNOSIS — I10 Essential (primary) hypertension: Secondary | ICD-10-CM | POA: Diagnosis not present

## 2016-02-20 DIAGNOSIS — R778 Other specified abnormalities of plasma proteins: Secondary | ICD-10-CM

## 2016-02-20 DIAGNOSIS — R748 Abnormal levels of other serum enzymes: Secondary | ICD-10-CM

## 2016-02-20 DIAGNOSIS — R000 Tachycardia, unspecified: Secondary | ICD-10-CM | POA: Diagnosis not present

## 2016-02-20 NOTE — Progress Notes (Signed)
Cardiology Office Note   Date:  02/20/2016   ID:  Kelly Myers, DOB [birth year], MRN [id number]  PCP:  Gerrit Heck, MD  Cardiologist:  Dr Claiborne Billings, saw in-hosp 11/01  Briza Bark, Suanne Marker, PA-C   Chief Complaint  Patient presents with  . Follow-up    post hosp  . Fatigue    History of Present Illness: Kelly Myers is a 60 y.o. female with a history of asthma, endometriosis, HTN, and hypothyroidism   Admitted 11/01-11/03 w/ fatigue and neck pain. Cards evaluated for an elevated troponin (peak 0.13), EF 55-60% and grade 2 dd by echo, started on a beta blocker for tachycardia, TSH low and levothyroxine decreased  Kelly Myers presents for post-hospital follow up.  She took the BB, but started getting her asthma cough. She was taken off the BB due to concern for asthma exacerbation.   She checks her BP at home and it usually runs better than today. She is vigilant about her BP because severe HTN runs in her family.   She gets L chest grabbing when she takes a deep breath, this is normal for her when her asthma is acting up.  She still feels her beating hard at times, but not fast. She will ck her HR and it may not be elevated. She has also had elevated at times, up to 120. She never feels out of breath. Her resting heart rate may be in the 90s.   In general, she feels tremendously better than before the hospitalization.    Past Medical History:  Diagnosis Date  . Asthma    Asthma cough- meds control  . Atrophic vaginitis   . Bleeds easily ([place])    "have had clotting studies, tests, I'm within normal limits" (02/12/2016)  . Endometriosis   . 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 History of blood transfusion 1984   following childbirth.  . History of radiation therapy 1963   after adenoid surgery x 2, they "grew back" and she was treated w/ XRT to the throat  . Hypertension   . Hypothyroidism   . Pneumonia    "as a child" (02/12/2016)  .  Thyroid nodule   . Uterine prolapse     Past Surgical History:  Procedure Laterality Date  . ADENOIDECTOMY  [id number]  . ANTERIOR AND POSTERIOR VAGINAL REPAIR  2000s  . BLADDER SUSPENSION  2000s  . CESAREAN SECTION  1984  . COLONOSCOPY WITH PROPOFOL N/A 11/04/2015   Procedure: COLONOSCOPY WITH PROPOFOL;  Surgeon: Garlan Fair, MD;  Location: [facility]COPY;  Service: Endoscopy;  Laterality: N/A;  . TUBAL LIGATION  1984  . VAGINAL HYSTERECTOMY  2000s   w/bladder suspension and A/P repair  . WISDOM TOOTH EXTRACTION  1974    Current Outpatient Prescriptions  Medication Sig Dispense Refill  . Calcium Carbonate-Vitamin D (CALCIUM + D PO) Take 1 tablet by mouth daily.     . Cetirizine HCl (ZYRTEC ALLERGY) 10 MG CAPS Take 10 mg by mouth daily.     . Cyanocobalamin (VITAMIN B-12 PO) Take 1 tablet by mouth daily.    Marland Kitchen estradiol (ESTRACE) 0.5 MG tablet Take 1 tablet (0.5 mg total) by mouth daily. 90 tablet 4  . Estradiol (VAGIFEM) 10 MCG TABS vaginal tablet Place 1 tablet (10 mcg total) vaginally 2 (two) times a week. Monday and Friday 24 tablet 4  . fluticasone (FLONASE) 50 MCG/ACT nasal spray Place 2 sprays into both nostrils daily.    Marland Kitchen  glucosamine-chondroitin 500-400 MG tablet Take 1 tablet by mouth daily.     Marland Kitchen levothyroxine (SYNTHROID, LEVOTHROID) 100 MCG tablet Take 1 tablet (100 mcg total) by mouth daily before breakfast. 30 tablet 0  . lisinopril (PRINIVIL,ZESTRIL) 10 MG tablet Take 10 mg by mouth daily.      . montelukast (SINGULAIR) 10 MG tablet Take 10 mg by mouth at bedtime.      . Omega-3 Fatty Acids (FISH OIL PO) Take 1 capsule by mouth daily.    . Probiotic Product (PROBIOTIC DAILY PO) Take 1 capsule by mouth daily.     . Pyridoxine HCl (VITAMIN B-6 PO) Take 1 tablet by mouth daily.     No current facility-administered medications for this visit.     Allergies:   Macrodantin [nitrofurantoin] and Neosporin [neomycin-bacitracin zn-polymyx]    Social History:  The patient   reports that she quit smoking about 34 years ago. Her smoking use included Cigarettes. She has a 5.00 pack-year smoking history. She has never used smokeless tobacco. She reports that she drinks about 7.2 oz of alcohol per week . She reports that she does not use drugs.   Family History:  The patient's family history includes Breast cancer in her paternal grandmother; Cancer in her father; Heart attack in her sister; Heart disease in her maternal grandfather and sister; Hypertension in her father, mother, and sister.    ROS:  Please see the history of present illness. All other systems are reviewed and negative.    PHYSICAL EXAM: VS:  BP 138/82   Pulse 91   Ht 5\' 4"  (1.626 m)   Wt 124 lb 6.4 oz (56.4 kg)   BMI 21.35 kg/m  , BMI Body mass index is 21.35 kg/m. GEN: Well nourished, well developed, female in no acute distress  HEENT: normal for age  Neck: no JVD, no carotid bruit, no masses Cardiac: RRR; soft murmur, no rubs, or gallops Respiratory:  clear to auscultation bilaterally, normal work of breathing GI: soft, nontender, nondistended, + BS MS: no deformity or atrophy; no edema; distal pulses are 2+ in all 4 extremities   Skin: warm and dry, no rash Neuro:  Strength and sensation are intact Psych: euthymic mood, full affect   EKG:  EKG is ordered today. The ekg ordered today demonstrates SR, inferior and lateral T wave changes were seen on recent admission, no old in system.  ECHO 02/13/2016 - Left ventricle: The cavity size was normal. There was mild concentric hypertrophy. Systolic function was normal. The estimated ejection fraction was in the range of 55% to 60%. Wall motion was normal; there were no regional wall motion abnormalities. Features are consistent with a pseudonormal left ventricular filling pattern, with concomitant abnormal relaxation and increased filling pressure (grade 2 diastolic dysfunction).   Recent Labs: 02/12/2016: B Natriuretic  Peptide 263.0; TSH 0.261 02/14/2016: ALT 70; BUN <5; Creatinine, Ser 0.57; Hemoglobin 10.6; Platelets 159; Potassium 4.0; Sodium 139    Lipid Panel No results found for: CHOL, TRIG, HDL, CHOLHDL, VLDL, LDLCALC, LDLDIRECT   Wt Readings from Last 3 Encounters:  02/20/16 124 lb 6.4 oz (56.4 kg)  02/14/16 126 lb 9.6 oz (57.4 kg)  02/11/16 127 lb (57.6 kg)     Other studies Reviewed: Additional studies/ records that were reviewed today include: Hospital notes and testing.  ASSESSMENT AND PLAN:  1.  Abnormal troponin: Mildly elevated during recent admission, but flat trend. She had some tachycardia to the 130s PTA, but EF was normal. No ischemic  sx. No further testing needed.   2. Tachycardia: pt HR still elevated consisently. She had a problem w/ metoprolol w/ asthma exacerbation. Advised that with thyroid abnormalities, propanolol is normally used. Could also try Diltiazem. Pt to discuss w/ MD managing her thyroid and decide if rx is needed. Since rx just adjusted, may just wait and see if HR will normalize. Tachycardia has not been an problem till now.   3. HTN: discussed what diastolic dysfunction is and that meds which slow her HR and help her BP may be beneficial to her. No changes for now.   Current medicines are reviewed at length with the patient today.  The patient does not have concerns regarding medicines.  The following changes have been made:  no change  Labs/ tests ordered today include:   Orders Placed This Encounter  Procedures  . EKG 12-Lead    Disposition:   FU with Dr Claiborne Billings  Signed, Rosaria Ferries, PA-C  02/20/2016 2:38 PM    [place] Phone: [phone]; Fax: [phone]  This note was written with the assistance of speech recognition software. Please excuse any transcriptional errors.

## 2016-02-20 NOTE — Patient Instructions (Addendum)
Medication Instructions:  Your physician recommends that you continue on your current medications as directed. Please refer to the Current Medication list given to you today.  Labwork: NONE  Testing/Procedures: NONE  Follow-Up: Your physician wants you to follow-up in: 6 months with Dr. Claiborne Billings. You will receive a reminder letter in the mail two months in advance. If you don't receive a letter, please call our office to schedule the follow-up appointment.   If you need a refill on your cardiac medications before your next appointment, please call your pharmacy.  Your physician wants you to discuss with your primary care doctor about taking propanolol or diltiazem for tachycardia.

## 2016-02-26 ENCOUNTER — Encounter: Payer: Self-pay | Admitting: Women's Health

## 2016-03-13 DIAGNOSIS — M8580 Other specified disorders of bone density and structure, unspecified site: Secondary | ICD-10-CM

## 2016-03-13 HISTORY — DX: Other specified disorders of bone density and structure, unspecified site: M85.80

## 2016-03-26 ENCOUNTER — Ambulatory Visit (INDEPENDENT_AMBULATORY_CARE_PROVIDER_SITE_OTHER): Payer: Managed Care, Other (non HMO)

## 2016-03-26 ENCOUNTER — Encounter: Payer: Self-pay | Admitting: Gynecology

## 2016-03-26 ENCOUNTER — Other Ambulatory Visit: Payer: Self-pay | Admitting: Gynecology

## 2016-03-26 DIAGNOSIS — M899 Disorder of bone, unspecified: Secondary | ICD-10-CM | POA: Diagnosis not present

## 2016-03-26 DIAGNOSIS — M8588 Other specified disorders of bone density and structure, other site: Secondary | ICD-10-CM

## 2016-03-26 DIAGNOSIS — Z13820 Encounter for screening for osteoporosis: Secondary | ICD-10-CM

## 2016-03-26 DIAGNOSIS — M8580 Other specified disorders of bone density and structure, unspecified site: Secondary | ICD-10-CM

## 2016-04-01 DIAGNOSIS — J309 Allergic rhinitis, unspecified: Secondary | ICD-10-CM | POA: Insufficient documentation

## 2016-09-17 ENCOUNTER — Ambulatory Visit (INDEPENDENT_AMBULATORY_CARE_PROVIDER_SITE_OTHER): Payer: BLUE CROSS/BLUE SHIELD | Admitting: Cardiovascular Disease

## 2016-09-17 ENCOUNTER — Encounter: Payer: Self-pay | Admitting: Cardiovascular Disease

## 2016-09-17 VITALS — BP 170/80 | HR 85 | Ht 64.0 in | Wt 123.8 lb

## 2016-09-17 DIAGNOSIS — E039 Hypothyroidism, unspecified: Secondary | ICD-10-CM | POA: Diagnosis not present

## 2016-09-17 DIAGNOSIS — R748 Abnormal levels of other serum enzymes: Secondary | ICD-10-CM | POA: Diagnosis not present

## 2016-09-17 DIAGNOSIS — I519 Heart disease, unspecified: Secondary | ICD-10-CM | POA: Diagnosis not present

## 2016-09-17 DIAGNOSIS — Z79899 Other long term (current) drug therapy: Secondary | ICD-10-CM | POA: Diagnosis not present

## 2016-09-17 DIAGNOSIS — I10 Essential (primary) hypertension: Secondary | ICD-10-CM

## 2016-09-17 DIAGNOSIS — I5189 Other ill-defined heart diseases: Secondary | ICD-10-CM

## 2016-09-17 MED ORDER — VALSARTAN 160 MG PO TABS: ORAL_TABLET | ORAL | 4 refills | Status: DC

## 2016-09-17 NOTE — Patient Instructions (Addendum)
Medication Instructions:  STOP Lisinopril  START Valsartan 160mg ;    Take half tablet(80mg ) once a day for 7 days, THEN increase to 1 whole tablet (160mg ) Monitor blood pressure,  if blood pressure is low go back to half tablet (80mg ) once a day   Labwork: Your physician recommends that you return for lab work in: 3 WEEKS (10/08/2016) Complete CMET, MAGNESIUM, TSH, CBC  Testing/Procedures: None   Follow-Up: Your physician recommends that you schedule a follow-up appointment in: 2 [place].  Any Other Special Instructions Will Be Listed Below (If Applicable).  If you need a refill on your cardiac medications before your next appointment, please call your pharmacy.

## 2016-09-17 NOTE — Progress Notes (Signed)
Cardiology Office Note    Date:  09/24/2016   ID:  Kelly Myers, DOB 07/03/1955, MRN [id number]  PCP:  Leighton Ruff, MD  Cardiologist:  Shelva Majestic, MD   No chief complaint on file.   History of Present Illness:  Kelly Myers is a 61 y.o. female who presents for cardiology evaluation and follow-up of her November 2017 hospitalization.  Kelly Myers has a history of asthma, endometriosis, hypotension, and hypothyroidism.  In November 2017 she presented to the emergency room with generalized fatigue and neck pain.  She is followed by Dr. Forde Dandy for hypothyroidism and is on levothyroxine prior to her admission, she was febrile, and tachycardic.  She had mildly increased d-dimer.  A chest CT was negative for PE.  BMP was minimally increased at 263 and troponin was 0.13.  LFTs were elevated with alkaline phosphatase of 205, ALT 89, AST 51.  I was concerned that she may have had over suppressed thyroid may also have been a contributor to her tachycardia. An echo Doppler study showed an EF of 55-60% with grade 2 diastolic dysfunction.   I felt her mild troponin elevation was demand ischemia rather than ACS.  Her levothyroxine dose was reduced to 100 g.  She developed mild asthma symptoms on the beta blocker was discontinued.  She was seen for office visit follow-up by Kelly Myers.  She is followed by Dr. Drema Dallas, for primary care.  States her blood pressure at home is recently been running in the 132-137 range.  She denies any chest pain or shortness of breath or awareness of palpitations.  She drinks one cup of caffeine in the morning and one half in the afternoon.  She recently returned from a trip to [place] and tolerated travel well.  In the past, she had developed low potassium on HCTZ.  She has a greater than 15 year history of hypertension and now is on lisinopril 10 mg daily.  She is on Singulair for her asthma.  She takes Flonase for allergic rhinitis symptoms.  She presents for cardiology  follow-up evaluation.   Past Medical History:  Diagnosis Date  . Asthma    Asthma cough- meds control  . Atrophic vaginitis   . Bleeds easily ([place])    "have had clotting studies, tests, I'm within normal limits" (02/12/2016)  . Endometriosis   . 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 History of blood transfusion 1984   following childbirth.  . History of radiation therapy 1963   after adenoid surgery x 2, they "grew back" and she was treated w/ XRT to the throat  . Hypertension   . Hypothyroidism   . Osteopenia 03/2016   T score -1.3 FRAX 6.5%/0.4% stable from prior DEXA  . Pneumonia    "as a child" (02/12/2016)  . Thyroid nodule   . Uterine prolapse     Past Surgical History:  Procedure Laterality Date  . ADENOIDECTOMY  1960; 9509  . ANTERIOR AND POSTERIOR VAGINAL REPAIR  2000s  . BLADDER SUSPENSION  2000s  . CESAREAN SECTION  1984  . COLONOSCOPY WITH PROPOFOL N/A 11/04/2015   Procedure: COLONOSCOPY WITH PROPOFOL;  Surgeon: Garlan Fair, MD;  Location: [facility];  Service: Endoscopy;  Laterality: N/A;  . TUBAL LIGATION  1984  . VAGINAL HYSTERECTOMY  2000s   w/bladder suspension and A/P repair  . WISDOM TOOTH EXTRACTION  1974    Current Medications: Outpatient Medications Prior to Visit  Medication Sig Dispense  Refill  . Calcium Carbonate-Vitamin D (CALCIUM + D PO) Take 1 tablet by mouth daily.     . Cetirizine HCl (ZYRTEC ALLERGY) 10 MG CAPS Take 10 mg by mouth daily.     . Cyanocobalamin (VITAMIN B-12 PO) Take 1 tablet by mouth daily.    Marland Kitchen estradiol (ESTRACE) 0.5 MG tablet Take 1 tablet (0.5 mg total) by mouth daily. 90 tablet 4  . Estradiol (VAGIFEM) 10 MCG TABS vaginal tablet Place 1 tablet (10 mcg total) vaginally 2 (two) times a week. Monday and Friday 24 tablet 4  . fluticasone (FLONASE) 50 MCG/ACT nasal spray Place 2 sprays into both nostrils daily.    Marland Kitchen glucosamine-chondroitin 500-400 MG tablet Take 1 tablet by mouth daily.     Marland Kitchen  levothyroxine (SYNTHROID, LEVOTHROID) 100 MCG tablet Take 1 tablet (100 mcg total) by mouth daily before breakfast. 30 tablet 0  . montelukast (SINGULAIR) 10 MG tablet Take 10 mg by mouth at bedtime.      . Omega-3 Fatty Acids (FISH OIL PO) Take 1 capsule by mouth daily.    . Probiotic Product (PROBIOTIC DAILY PO) Take 1 capsule by mouth daily.     Marland Kitchen lisinopril (PRINIVIL,ZESTRIL) 10 MG tablet Take 10 mg by mouth daily.      . Pyridoxine HCl (VITAMIN B-6 PO) Take 1 tablet by mouth daily.     No facility-administered medications prior to visit.      Allergies:   Macrodantin [nitrofurantoin] and Neosporin [neomycin-bacitracin zn-polymyx]   Social History   Social History  . Marital status: Married    Spouse name: N/A  . Number of children: N/A  . Years of education: N/A   Social History Main Topics  . Smoking status: Former Smoker    Packs/day: 0.50    Years: 10.00    Types: Cigarettes    Quit date: 69  . Smokeless tobacco: Never Used  . Alcohol use 7.2 oz/week    5 Standard drinks or equivalent, 7 Glasses of wine per week  . Drug use: No  . Sexual activity: Yes    Birth control/ protection: Surgical   Other Topics Concern  . None   Social History Narrative  . None     Family History:  The patient's family history includes Breast cancer in her paternal grandmother; Cancer in her father; Heart attack in her sister; Heart disease in her maternal grandfather and sister; Hypertension in her father, mother, and sister.   ROS General: Negative; No fevers, chills, or night sweats;  HEENT: Negative; No changes in vision or hearing, sinus congestion, difficulty swallowing Pulmonary: Positive for mild asthma Cardiovascular: Negative; No chest pain, presyncope, syncope, palpitations GI: Negative; No nausea, vomiting, diarrhea, or abdominal pain GU: Positive for endometriosis Musculoskeletal: Negative; no myalgias, joint pain, or weakness Hematologic/Oncology: Negative; no easy  bruising, bleeding Endocrine: Positive for hypothyroidism Neuro: Negative; no changes in balance, headaches Skin: Negative; No rashes or skin lesions Psychiatric: Negative; No behavioral problems, depression Sleep: Negative; No snoring, daytime sleepiness, hypersomnolence, bruxism, restless legs, hypnogognic hallucinations, no cataplexy Other comprehensive 14 point system review is negative.   PHYSICAL EXAM:   VS:  BP (!) 170/80   Pulse 85   Ht 5' 4"  (1.626 m)   Wt 123 lb 12.8 oz (56.2 kg)   BMI 21.25 kg/m      Wt Readings from Last 3 Encounters:  09/17/16 123 lb 12.8 oz (56.2 kg)  02/20/16 124 lb 6.4 oz (56.4 kg)  02/14/16 126 lb 9.6  oz (57.4 kg)    General: Alert, oriented, no distress.  Skin: normal turgor, no rashes, warm and dry HEENT: Normocephalic, atraumatic. Pupils equal round and reactive to light; sclera anicteric; extraocular muscles intact; Fundi without hemorrhages or exudates Nose without nasal septal hypertrophy Mouth/Parynx benign; Mallinpatti scale 2 Neck: No JVD, no carotid bruits; normal carotid upstroke Lungs: clear to ausculatation and percussion; no wheezing or rales Chest wall: without tenderness to palpitation Heart: PMI not displaced, RRR, s1 s2 normal, faint 1/6 systolic murmur, no diastolic murmur, no rubs, gallops, thrills, or heaves Abdomen: soft, nontender; no hepatosplenomehaly, BS+; abdominal aorta nontender and not dilated by palpation. Back: no CVA tenderness Pulses 2+ Musculoskeletal: full range of motion, normal strength, no joint deformities Extremities: no clubbing cyanosis or edema, Homan's sign negative  Neurologic: grossly nonfocal; Cranial nerves grossly wnl Psychologic: Normal mood and affect   Studies/Labs Reviewed:   EKG:  EKG is ordered today.  ECG (independently read by me): Normal sinus rhythm at 85 bpm with occasional PACs.  Possible left atrial enlargement.  Poor R wave progression anteriorly.  QTc interval 456.  PR  interval 172 ms.  Recent Labs: BMP Latest Ref Rng & Units 02/14/2016 02/13/2016 02/12/2016  Glucose 65 - 99 mg/dL 110(H) 112(H) 132(H)  BUN 6 - 20 mg/dL <5(L) <5(L) <5(L)  Creatinine 0.44 - 1.00 mg/dL 0.57 0.56 0.67  Sodium 135 - 145 mmol/L 139 140 136  Potassium 3.5 - 5.1 mmol/L 4.0 3.8 3.8  Chloride 101 - 111 mmol/L 108 110 104  CO2 22 - 32 mmol/L 25 24 23   Calcium 8.9 - 10.3 mg/dL 9.1 8.6(L) 9.0     Hepatic Function Latest Ref Rng & Units 02/14/2016 02/13/2016 02/12/2016  Total Protein 6.5 - 8.1 g/dL 5.7(L) 5.7(L) 6.3(L)  Albumin 3.5 - 5.0 g/dL 2.8(L) 2.9(L) 3.4(L)  AST 15 - 41 U/L 38 53(H) 51(H)  ALT 14 - 54 U/L 70(H) 80(H) 89(H)  Alk Phosphatase 38 - 126 U/L 187(H) 190(H) 205(H)  Total Bilirubin 0.3 - 1.2 mg/dL 0.8 1.0 1.7(H)    CBC Latest Ref Rng & Units 02/14/2016 02/13/2016 02/12/2016  WBC 4.0 - 10.5 K/uL 5.4 5.5 8.0  Hemoglobin 12.0 - 15.0 g/dL 10.6(L) 11.1(L) 12.2  Hematocrit 36.0 - 46.0 % 32.1(L) 33.5(L) 36.0  Platelets 150 - 400 K/uL 159 146(L) 152   Lab Results  Component Value Date   MCV 88.7 02/14/2016   MCV 88.4 02/13/2016   MCV 87.6 02/12/2016   Lab Results  Component Value Date   TSH 0.261 (L) 02/12/2016   No results found for: HGBA1C   BNP    Component Value Date/Time   BNP 263.0 (H) 02/12/2016 1220    ProBNP No results found for: PROBNP   Lipid Panel  No results found for: CHOL, TRIG, HDL, CHOLHDL, VLDL, LDLCALC, LDLDIRECT   RADIOLOGY: No results found.   Additional studies/ records that were reviewed today include:  Reviewed her hospital admission records from November 2017.  I reviewed her follow-up office visit with Kelly Myers.  Laboratory was reviewed as was her echo Doppler data.    ASSESSMENT:    1. Essential hypertension   2. Hypothyroidism, unspecified type   3. Medication management   4. Elevated liver enzymes   5. Diastolic dysfunction      PLAN:   Ms Furniss is a 10-year-old female who has a long-standing history of  hypertension, as well as a history of hypothyroidism for which she is on levothyroxine replacement followed by Dr.  [place].  She also has a history of asthma and endometriosis.  I had seen her during her hospitalization in November 2017 when she was tachycardic and TSH was over suppressed.  At that time her Synthroid dose was mildly reduced.  Has been on lisinopril recently for hypertension.  She has a history of asthma.  I believe she may do better with ARB therapy rather than ACE inhibition and I have recommended discontinuance of lisinopril and will start her on valsartan, initially at 80 mg for the first week and then she will increase this to 160 mg daily.  She had elevated liver function studies in November.  Presently, she denies recent palpitations.  She does drink caffeine in small quantities.  There is no chest pain or dyspnea.  I reviewed her echo Doppler data with her and this demonstrated normal systolic function with grade 2 diastolic dysfunction.  I have recommended follow-up laboratory be obtained in 3 weeks including a chemistry profile, magnesium level, CBC and TSH.  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  I will see her in 2 months for reevaluation.   Medication Adjustments/Labs and Tests Ordered: Current medicines are reviewed at length with the patient today.  Concerns regarding medicines are outlined above.  Medication changes, Labs and Tests ordered today are listed in the Patient Instructions below. Patient Instructions  Medication Instructions:  STOP Lisinopril  START Valsartan 134m;    Take half tablet(819m once a day for 7 days, THEN increase to 1 whole tablet (16066mMonitor blood pressure,  if blood pressure is low go back to half tablet (66m33mnce a day   Labwork: Your physician recommends that you return for lab work in: 3 WEEKS (10/08/2016) Complete CMET, MAGNESIUM, TSH, CBC  Testing/Procedures: None   Follow-Up: Your  physician recommends that you schedule a follow-up appointment in: [place] Other Special Instructions Will Be Listed Below (If Applicable).  If you need a refill on your cardiac medications before your next appointment, please call your pharmacy.     Signed, ThomShelva Majestic, FACCOasis Surgery Center LP4/2018 2:10 PM    [facility] [address]  [phone]: [phone]

## 2016-10-08 DIAGNOSIS — I10 Essential (primary) hypertension: Secondary | ICD-10-CM | POA: Diagnosis not present

## 2016-10-08 DIAGNOSIS — Z79899 Other long term (current) drug therapy: Secondary | ICD-10-CM | POA: Diagnosis not present

## 2016-10-08 DIAGNOSIS — E039 Hypothyroidism, unspecified: Secondary | ICD-10-CM | POA: Diagnosis not present

## 2016-10-09 LAB — COMPREHENSIVE METABOLIC PANEL
A/G RATIO: 2.2 (ref 1.2–2.2)
ALK PHOS: 59 IU/L (ref 39–117)
ALT: 29 IU/L (ref 0–32)
AST: 28 IU/L (ref 0–40)
Albumin: 4.6 g/dL (ref 3.6–4.8)
BILIRUBIN TOTAL: 0.5 mg/dL (ref 0.0–1.2)
BUN/Creatinine Ratio: 23 (ref 12–28)
BUN: 17 mg/dL (ref 8–27)
CALCIUM: 9.8 mg/dL (ref 8.7–10.3)
CHLORIDE: 98 mmol/L (ref 96–106)
CO2: 25 mmol/L (ref 20–29)
Creatinine, Ser: 0.75 mg/dL (ref 0.57–1.00)
GFR calc Af Amer: 99 mL/min/{1.73_m2} (ref >59)
GFR, EST NON AFRICAN AMERICAN: 86 mL/min/{1.73_m2} (ref >59)
Globulin, Total: 2.1 g/dL (ref 1.5–4.5)
Glucose: 108 mg/dL — ABNORMAL HIGH (ref 65–99)
POTASSIUM: 4.5 mmol/L (ref 3.5–5.2)
SODIUM: 137 mmol/L (ref 134–144)
Total Protein: 6.7 g/dL (ref 6.0–8.5)

## 2016-10-09 LAB — CBC
HEMATOCRIT: 40.3 % (ref 34.0–46.6)
Hemoglobin: 13.8 g/dL (ref 11.1–15.9)
MCH: 30.1 pg (ref 26.6–33.0)
MCHC: 34.2 g/dL (ref 31.5–35.7)
MCV: 88 fL (ref 79–97)
Platelets: 164 10*3/uL (ref 150–379)
RBC: 4.59 x10E6/uL (ref 3.77–5.28)
RDW: 14.1 % (ref 12.3–15.4)
WBC: 6.1 10*3/uL (ref 3.4–10.8)

## 2016-10-09 LAB — MAGNESIUM: Magnesium: 2 mg/dL (ref 1.6–2.3)

## 2016-10-09 LAB — TSH: TSH: 0.231 u[IU]/mL — AB (ref 0.450–4.500)

## 2016-10-23 ENCOUNTER — Telehealth: Payer: Self-pay | Admitting: Cardiovascular Disease

## 2016-10-23 NOTE — Telephone Encounter (Signed)
Patient calling, states that she would like to know if Dr. Claiborne Billings is going to change her thyroid medication. Patient states that thyroid levels were abnormal.

## 2016-10-23 NOTE — Telephone Encounter (Signed)
Called the patient back to inform her that her message will be routed to Dr. Claiborne Billings for his recommendation.   Latest results from 6/28  TSH 0.450 - 4.500 uIU/mL 0.231   0.261R, CM

## 2016-10-30 MED ORDER — IRBESARTAN 75 MG PO TABS: 75.0000 mg | ORAL_TABLET | Freq: Every day | ORAL | 0 refills | Status: DC

## 2016-10-30 NOTE — Telephone Encounter (Signed)
Returned the call back to the patient and informed that the labs will be sent to her Endocrinologist for their recommendation, per Dr. Claiborne Billings  She also stated that she is on Valsartan that was recalled. She stated that she only takes Valsartan 80 mg and not the 160 mg so Irbesartan 75 mg daily has been called in for her. She has been instructed to take her blood pressure for the next two weeks and notify us of any changes in her blood pressure. She verbalized her understanding.

## 2016-10-30 NOTE — Telephone Encounter (Signed)
TSH remains over suppressed.  Send lab to Dr. Reynold Bowen.  In the past the patient was told by him that he wanted thyroid over suppressed slightly.  Consider reducing her Synthroid but will defer to Dr. Forde Dandy for final recommendation concerning Synthroid dose reduction.

## 2016-10-30 NOTE — Telephone Encounter (Signed)
Follow Up ° ° ° °Returning call from earlier. Please call. °

## 2016-10-30 NOTE — Telephone Encounter (Signed)
Left message for patient to call back, per [facility]

## 2016-11-30 ENCOUNTER — Other Ambulatory Visit: Payer: Self-pay | Admitting: Family Medicine

## 2016-11-30 DIAGNOSIS — Z1231 Encounter for screening mammogram for malignant neoplasm of breast: Secondary | ICD-10-CM

## 2016-12-08 ENCOUNTER — Telehealth: Payer: Self-pay | Admitting: *Deleted

## 2016-12-08 DIAGNOSIS — N952 Postmenopausal atrophic vaginitis: Secondary | ICD-10-CM

## 2016-12-08 MED ORDER — ESTRADIOL 10 MCG VA TABS: 10.0000 ug | ORAL_TABLET | VAGINAL | 0 refills | Status: DC

## 2016-12-08 NOTE — Telephone Encounter (Signed)
Pt called stating she [place] a new pharmacy [place], no longer uses mail order. Requesting generic vagifem send to local pharmacy. Rx sent.

## 2016-12-15 ENCOUNTER — Ambulatory Visit (INDEPENDENT_AMBULATORY_CARE_PROVIDER_SITE_OTHER): Payer: BLUE CROSS/BLUE SHIELD | Admitting: Cardiovascular Disease

## 2016-12-15 ENCOUNTER — Encounter: Payer: Self-pay | Admitting: Cardiovascular Disease

## 2016-12-15 VITALS — BP 175/101 | HR 83 | Ht 64.0 in | Wt 126.0 lb

## 2016-12-15 DIAGNOSIS — I10 Essential (primary) hypertension: Secondary | ICD-10-CM | POA: Diagnosis not present

## 2016-12-15 DIAGNOSIS — Z79899 Other long term (current) drug therapy: Secondary | ICD-10-CM

## 2016-12-15 DIAGNOSIS — E039 Hypothyroidism, unspecified: Secondary | ICD-10-CM | POA: Diagnosis not present

## 2016-12-15 DIAGNOSIS — I519 Heart disease, unspecified: Secondary | ICD-10-CM | POA: Diagnosis not present

## 2016-12-15 DIAGNOSIS — E042 Nontoxic multinodular goiter: Secondary | ICD-10-CM

## 2016-12-15 DIAGNOSIS — J45909 Unspecified asthma, uncomplicated: Secondary | ICD-10-CM | POA: Diagnosis not present

## 2016-12-15 DIAGNOSIS — I5189 Other ill-defined heart diseases: Secondary | ICD-10-CM

## 2016-12-15 MED ORDER — IRBESARTAN 150 MG PO TABS: 150.0000 mg | ORAL_TABLET | Freq: Every day | ORAL | 3 refills | Status: DC

## 2016-12-15 NOTE — Progress Notes (Signed)
Cardiology Office Note    Date:  12/15/2016   ID:  ANNA-MARIE COLLER, DOB 05-May-1955, MRN [id number]  PCP:  Leighton Ruff, MD  Cardiologist:  Shelva Majestic, MD   Chief Complaint  Patient presents with  . Follow-up    2 months;    History of Present Illness:  Kelly Myers is a 61 y.o. female who presents for a 2 month follow-up cardiology evaluation.   Kelly Myers has a history of asthma, endometriosis, hypotension, and hypothyroidism.  In November 2017 she presented to the emergency room with generalized fatigue and neck pain.  She is followed by Dr. Forde Dandy for hypothyroidism and is on levothyroxine prior to her admission, she was febrile, and tachycardic.  She had mildly increased d-dimer.  A chest CT was negative for PE.  BMP was minimally increased at 263 and troponin was 0.13.  LFTs were elevated with alkaline phosphatase of 205, ALT 89, AST 51.  I was concerned that she may have had over suppressed thyroid may also have been a contributor to her tachycardia. An echo Doppler study showed an EF of 55-60% with grade 2 diastolic dysfunction.   I felt her mild troponin elevation was demand ischemia rather than ACS.  Her levothyroxine dose was reduced to 100 g.  She developed mild asthma symptoms on the beta blocker was discontinued.    She has a greater than 15 year history of hypertension and was on lisinopril 10 mg daily.  In the past, she had developed hypokalemia on hydrochlorothiazide.  She is on Singulair for her asthma.  She takes Flonase for allergic rhinitis symptoms.    When I last saw her, particularly with her asthma history.  I felt she would do better with ARB therapy and discontinued lisinopril and started initially valsartan at 80 mg with the implants increased to 160 mg daily.  An echo Doppler study did show normal systolic function with grade 2 diastolic dysfunction.  She underwent follow-up laboratory and again her TSH was over suppressed at 0.231.  She is followed by Dr.  Tamala Ser because of her thyroid nodules.  He has wanted to keep her TSH in the over suppressed range and as result, he did not make additional adjustment to her thyroid medication.  She was notified that her generic valsartan was from one of the companies that had purchased the tainted valsartan powder from [place].  As result, she was switched to irbesartan, but initially at a very low dose of just 75 mg.  She continues to be on Singulair for asthma, levothyroxine 100 g for her thyroid abnormalities.  This past weekend over Labor Day.  She [place] many hours each day and did well.  She presents for follow-up evaluation.  Past Medical History:  Diagnosis Date  . Asthma    Asthma cough- meds control  . Atrophic vaginitis   . Bleeds easily ([place])    "have had clotting studies, tests, I'm within normal limits" (02/12/2016)  . Endometriosis   . 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 History of blood transfusion 1984   following childbirth.  . History of radiation therapy 1963   after adenoid surgery x 2, they "grew back" and she was treated w/ XRT to the throat  . Hypertension   . Hypothyroidism   . Osteopenia 03/2016   T score -1.3 FRAX 6.5%/0.4% stable from prior DEXA  . Pneumonia    "  as a child" (02/12/2016)  . Thyroid nodule   . Uterine prolapse     Past Surgical History:  Procedure Laterality Date  . ADENOIDECTOMY  1960; 1963  . ANTERIOR AND POSTERIOR VAGINAL REPAIR  2000s  . BLADDER SUSPENSION  2000s  . CESAREAN SECTION  1984  . COLONOSCOPY WITH PROPOFOL N/A 11/04/2015   Procedure: COLONOSCOPY WITH PROPOFOL;  Surgeon: Martin K Johnson, MD;  Location: [facility];  Service: Endoscopy;  Laterality: N/A;  . TUBAL LIGATION  1984  . VAGINAL HYSTERECTOMY  2000s   w/bladder suspension and A/P repair  . WISDOM TOOTH EXTRACTION  1974    Current Medications: Outpatient Medications Prior to Visit  Medication Sig  Dispense Refill  . Calcium Carbonate-Vitamin D (CALCIUM + D PO) Take 1 tablet by mouth daily.     . Cetirizine HCl (ZYRTEC ALLERGY) 10 MG CAPS Take 10 mg by mouth daily.     . Cyanocobalamin (VITAMIN B-12 PO) Take 1 tablet by mouth daily.    . estradiol (ESTRACE) 0.5 MG tablet Take 1 tablet (0.5 mg total) by mouth daily. 90 tablet 4  . Estradiol (VAGIFEM) 10 MCG TABS vaginal tablet Place 1 tablet (10 mcg total) vaginally 2 (two) times a week. Monday and Friday 24 tablet 0  . fluticasone (FLONASE) 50 MCG/ACT nasal spray Place 2 sprays into both nostrils daily.    . glucosamine-chondroitin 500-400 MG tablet Take 1 tablet by mouth daily.     . levothyroxine (SYNTHROID, LEVOTHROID) 100 MCG tablet Take 1 tablet (100 mcg total) by mouth daily before breakfast. 30 tablet 0  . montelukast (SINGULAIR) 10 MG tablet Take 10 mg by mouth at bedtime.      . Omega-3 Fatty Acids (FISH OIL PO) Take 1 capsule by mouth daily.    . Probiotic Product (PROBIOTIC DAILY PO) Take 1 capsule by mouth daily.     . irbesartan (AVAPRO) 75 MG tablet Take 1 tablet (75 mg total) by mouth daily. 90 tablet 0   No facility-administered medications prior to visit.      Allergies:   Macrodantin [nitrofurantoin] and Neosporin [neomycin-bacitracin zn-polymyx]   Social History   Social History  . Marital status: Married    Spouse name: N/A  . Number of children: N/A  . Years of education: N/A   Social History Main Topics  . Smoking status: Former Smoker    Packs/day: 0.50    Years: 10.00    Types: Cigarettes    Quit date: 1983  . Smokeless tobacco: Never Used  . Alcohol use 7.2 oz/week    5 Standard drinks or equivalent, 7 Glasses of wine per week  . Drug use: No  . Sexual activity: Yes    Birth control/ protection: Surgical   Other Topics Concern  . None   Social History Narrative  . None     Family History:  The patient's family history includes Breast cancer in her paternal grandmother; Cancer in her  father; Heart attack in her sister; Heart disease in her maternal grandfather and sister; Hypertension in her father, mother, and sister.   ROS General: Negative; No fevers, chills, or night sweats;  HEENT: Negative; No changes in vision or hearing, sinus congestion, difficulty swallowing Pulmonary: Positive for mild asthma Cardiovascular: Negative; No chest pain, presyncope, syncope, palpitations GI: Negative; No nausea, vomiting, diarrhea, or abdominal pain GU: Positive for endometriosis Musculoskeletal: Negative; no myalgias, joint pain, or weakness Hematologic/Oncology: Negative; no easy bruising, bleeding Endocrine: Positive for hypothyroidism Neuro: Negative; no   changes in balance, headaches Skin: Negative; No rashes or skin lesions Psychiatric: Negative; No behavioral problems, depression Sleep: Negative; No snoring, daytime sleepiness, hypersomnolence, bruxism, restless legs, hypnogognic hallucinations, no cataplexy Other comprehensive 14 point system review is negative.   PHYSICAL EXAM:   VS:  BP (!) 175/101   Pulse 83   Ht 5' 4" (1.626 m)   Wt 126 lb (57.2 kg)   BMI 21.63 kg/m     Repeat blood pressure by me was 158/90.  Wt Readings from Last 3 Encounters:  12/15/16 126 lb (57.2 kg)  09/17/16 123 lb 12.8 oz (56.2 kg)  02/20/16 124 lb 6.4 oz (56.4 kg)    General: Alert, oriented, no distress.  Skin: normal turgor, no rashes, warm and dry HEENT: Normocephalic, atraumatic. Pupils equal round and reactive to light; sclera anicteric; extraocular muscles intact; Fundi not checked today but previously normal Nose without nasal septal hypertrophy Mouth/Parynx benign; Mallinpatti scale 2 Neck: No JVD, no carotid bruits; normal carotid upstroke Lungs: clear to ausculatation and percussion; no wheezing or rales Chest wall: without tenderness to palpitation Heart: PMI not displaced, RRR, s1 s2 normal, 1/6 systolic murmur, no diastolic murmur, no rubs, gallops, thrills, or  heaves Abdomen: soft, nontender; no hepatosplenomehaly, BS+; abdominal aorta nontender and not dilated by palpation. Back: no CVA tenderness Pulses 2+ Musculoskeletal: full range of motion, normal strength, no joint deformities Extremities: no clubbing cyanosis or edema, Homan's sign negative  Neurologic: grossly nonfocal; Cranial nerves grossly wnl Psychologic: Normal mood and affect   Studies/Labs Reviewed:   EKG:  EKG is ordered today.  ECG (independently read by me): Normal sinus rhythm with an isolated PVC.  No signal ST-T changes.  QTc interval 448 ms.  June 2018 ECG (independently read by me): Normal sinus rhythm at 85 bpm with occasional PACs.  Possible left atrial enlargement.  Poor R wave progression anteriorly.  QTc interval 456.  PR interval 172 ms.  Recent Labs: BMP Latest Ref Rng & Units 10/08/2016 02/14/2016 02/13/2016  Glucose 65 - 99 mg/dL 108(H) 110(H) 112(H)  BUN 8 - 27 mg/dL 17 <5(L) <5(L)  Creatinine 0.57 - 1.00 mg/dL 0.75 0.57 0.56  BUN/Creat Ratio 12 - 28 23 - -  Sodium 134 - 144 mmol/L 137 139 140  Potassium 3.5 - 5.2 mmol/L 4.5 4.0 3.8  Chloride 96 - 106 mmol/L 98 108 110  CO2 20 - 29 mmol/L _0 Calcium 8.7 - 10.3 mg/dL 9.8 9.1 8.6(L)     Hepatic Function Latest Ref Rng & Units 10/08/2016 02/14/2016 02/13/2016  Total Protein 6.0 - 8.5 g/dL 6.7 5.7(L) 5.7(L)  Albumin 3.6 - 4.8 g/dL 4.6 2.8(L) 2.9(L)  AST 0 - 40 IU/L 28 38 53(H)  ALT 0 - 32 IU/L 29 70(H) 80(H)  Alk Phosphatase 39 - 117 IU/L 59 187(H) 190(H)  Total Bilirubin 0.0 - 1.2 mg/dL 0.5 0.8 1.0    CBC Latest Ref Rng & Units 10/08/2016 02/14/2016 02/13/2016  WBC 3.4 - 10.8 x10E3/uL 6.1 5.4 5.5  Hemoglobin 11.1 - 15.9 g/dL 13.8 10.6(L) 11.1(L)  Hematocrit 34.0 - 46.6 % 40.3 32.1(L) 33.5(L)  Platelets 150 - 379 x10E3/uL 164 159 146(L)   Lab Results  Component Value Date   MCV 88 10/08/2016   MCV 88.7 02/14/2016   MCV 88.4 02/13/2016   Lab Results  Component Value Date   TSH 0.231 (L)  10/08/2016   No results found for: HGBA1C   BNP    Component Value Date/Time  BNP 263.0 (H) 02/12/2016 1220    ProBNP No results found for: PROBNP   Lipid Panel  No results found for: CHOL, TRIG, HDL, CHOLHDL, VLDL, LDLCALC, LDLDIRECT   RADIOLOGY: No results found.   Additional studies/ records that were reviewed today include:  Reviewed her hospital admission records from November 2017.  I reviewed her follow-up office visit with Rosaria Ferries.  Laboratory was reviewed as was her echo Doppler data.    ASSESSMENT:    1. Essential hypertension   2. Hypothyroidism, unspecified type   3. Diastolic dysfunction   4. Medication management   5. Multiple thyroid nodules   6. Mild asthma without complication, unspecified whether persistent      PLAN:   Ms Fleischhacker is a 61 year old female who has a long-standing history of hypertension, as well as a history of hypothyroidism for which she is on levothyroxine replacement followed by Dr. Forde Dandy. Remotely, she has been documented to have thyroid nodules and for this reason, Dr. Forde Dandy has consistently try to keep her TSH over suppressed.  She also has a history of asthma and endometriosis.  I had seen her during her hospitalization in November 2017 when she was tachycardic and TSH was over suppressed.  Her most recent echo Doppler study has demonstrated normal systolic function with grade 2 diastolic dysfunction.  Because of her asthma, I switched her from ace inhibition to ARB therapy.  Unfortunately, she was prescribed valsartan from the generic company where there was tainted impurities.  Due to the company in [place].  She was switched to low-dose irbesartan.  Her blood pressure today is elevated on low-dose irbesartan.  I've suggested she increase this to 150 mg daily.  She will monitor her blood pressure.  She will be seeing Dr. Forde Dandy in October who will be rechecking TSH and thyroid measures.  I reviewed her additional laboratory from  June.  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  She tolerated and begin a full dancing without chest pain.  She will monitor blood pressure closely and if her blood pressure remains elevated, she will contact our office.  Otherwise, as long as she is stable, I will see her in 6 months for reevaluation.  Medication Adjustments/Labs and Tests Ordered: Current medicines are reviewed at length with the patient today.  Concerns regarding medicines are outlined above.  Medication changes, Labs and Tests ordered today are listed in the Patient Instructions below. Patient Instructions  Medication Instructions:  INCREASE irbesartan to 150 mg daily  Follow-Up: Your physician wants you to follow-up in: 6 MONTHS with Dr. Claiborne Billings. You will receive a reminder letter in the mail two months in advance. If you don't receive a letter, please call our office to schedule the follow-up appointment.   Any Other Special Instructions Will Be Listed Below (If Applicable).     If you need a refill on your cardiac medications before your next appointment, please call your pharmacy.      Signed, Shelva Majestic, MD, [facility] 12/15/2016 1:14 PM    [place] [address] [place], [place], [place]  [address] Phone: [phone]

## 2016-12-15 NOTE — Patient Instructions (Signed)
Medication Instructions:  INCREASE irbesartan to 150 mg daily  Follow-Up: Your physician wants you to follow-up in: 6 MONTHS with Dr. Claiborne Billings. You will receive a reminder letter in the mail two months in advance. If you don't receive a letter, please call our office to schedule the follow-up appointment.   Any Other Special Instructions Will Be Listed Below (If Applicable).     If you need a refill on your cardiac medications before your next appointment, please call your pharmacy.

## 2016-12-21 ENCOUNTER — Ambulatory Visit: Payer: BLUE CROSS/BLUE SHIELD

## 2016-12-28 ENCOUNTER — Ambulatory Visit
Admission: RE | Admit: 2016-12-28 | Discharge: 2016-12-28 | Disposition: A | Discharge status: Home / Self Care | Payer: BLUE CROSS/BLUE SHIELD | Source: Ambulatory Visit | Attending: Family Medicine | Admitting: Family Medicine

## 2016-12-28 ENCOUNTER — Encounter: Payer: Self-pay | Admitting: Women's Health

## 2016-12-28 DIAGNOSIS — Z1231 Encounter for screening mammogram for malignant neoplasm of breast: Secondary | ICD-10-CM

## 2017-01-06 ENCOUNTER — Encounter: Payer: Self-pay | Admitting: Podiatry

## 2017-01-06 ENCOUNTER — Ambulatory Visit (INDEPENDENT_AMBULATORY_CARE_PROVIDER_SITE_OTHER): Payer: BLUE CROSS/BLUE SHIELD | Admitting: Podiatry

## 2017-01-06 DIAGNOSIS — Q828 Other specified congenital malformations of skin: Secondary | ICD-10-CM

## 2017-01-06 NOTE — Progress Notes (Signed)
Subjective:    Patient ID: Kelly Myers, female   DOB: 61 y.o.   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

## 2017-01-12 DIAGNOSIS — I10 Essential (primary) hypertension: Secondary | ICD-10-CM | POA: Diagnosis not present

## 2017-01-12 DIAGNOSIS — E038 Other specified hypothyroidism: Secondary | ICD-10-CM | POA: Diagnosis not present

## 2017-01-12 DIAGNOSIS — Z1389 Encounter for screening for other disorder: Secondary | ICD-10-CM | POA: Diagnosis not present

## 2017-01-12 DIAGNOSIS — D126 Benign neoplasm of colon, unspecified: Secondary | ICD-10-CM | POA: Diagnosis not present

## 2017-01-12 DIAGNOSIS — E042 Nontoxic multinodular goiter: Secondary | ICD-10-CM | POA: Diagnosis not present

## 2017-01-12 DIAGNOSIS — Z23 Encounter for immunization: Secondary | ICD-10-CM | POA: Diagnosis not present

## 2017-01-21 DIAGNOSIS — I10 Essential (primary) hypertension: Secondary | ICD-10-CM | POA: Diagnosis not present

## 2017-01-21 DIAGNOSIS — Z79899 Other long term (current) drug therapy: Secondary | ICD-10-CM | POA: Diagnosis not present

## 2017-01-21 DIAGNOSIS — E039 Hypothyroidism, unspecified: Secondary | ICD-10-CM | POA: Diagnosis not present

## 2017-01-21 DIAGNOSIS — J309 Allergic rhinitis, unspecified: Secondary | ICD-10-CM | POA: Diagnosis not present

## 2017-02-23 ENCOUNTER — Telehealth: Payer: Self-pay | Admitting: *Deleted

## 2017-02-23 ENCOUNTER — Encounter: Payer: Self-pay | Admitting: Women's Health

## 2017-02-23 ENCOUNTER — Ambulatory Visit: Payer: BLUE CROSS/BLUE SHIELD | Admitting: Women's Health

## 2017-02-23 VITALS — BP 124/78 | Ht 64.0 in | Wt 129.0 lb

## 2017-02-23 DIAGNOSIS — Z13820 Encounter for screening for osteoporosis: Secondary | ICD-10-CM

## 2017-02-23 DIAGNOSIS — Z01419 Encounter for gynecological examination (general) (routine) without abnormal findings: Secondary | ICD-10-CM

## 2017-02-23 DIAGNOSIS — N952 Postmenopausal atrophic vaginitis: Secondary | ICD-10-CM | POA: Diagnosis not present

## 2017-02-23 MED ORDER — ESTRADIOL 10 MCG VA TABS: 10.0000 ug | ORAL_TABLET | VAGINAL | 0 refills | Status: DC

## 2017-02-23 MED ORDER — ESTRADIOL 0.1 MG/GM VA CREA: 1.0000 | TOPICAL_CREAM | Freq: Every day | VAGINAL | 12 refills | Status: DC

## 2017-02-23 NOTE — Patient Instructions (Signed)
Health Maintenance for Postmenopausal Women Menopause is a normal process in which your reproductive ability comes to an end. This process happens gradually over a span of months to years, usually between the ages of 22 and 9. Menopause is complete when you have missed 12 consecutive menstrual periods. It is important to talk with your health care provider about some of the most common conditions that affect postmenopausal women, such as heart disease, cancer, and bone loss (osteoporosis). Adopting a healthy lifestyle and getting preventive care can help to promote your health and wellness. Those actions can also lower your chances of developing some of these common conditions. What should I know about menopause? During menopause, you may experience a number of symptoms, such as:  Moderate-to-severe hot flashes.  Night sweats.  Decrease in sex drive.  Mood swings.  Headaches.  Tiredness.  Irritability.  Memory problems.  Insomnia.  Choosing to treat or not to treat menopausal changes is an individual decision that you make with your health care provider. What should I know about hormone replacement therapy and supplements? Hormone therapy products are effective for treating symptoms that are associated with menopause, such as hot flashes and night sweats. Hormone replacement carries certain risks, especially as you become older. If you are thinking about using estrogen or estrogen with progestin treatments, discuss the benefits and risks with your health care provider. What should I know about heart disease and stroke? Heart disease, heart attack, and stroke become more likely as you age. This may be due, in part, to the hormonal changes that your body experiences during menopause. These can affect how your body processes dietary fats, triglycerides, and cholesterol. Heart attack and stroke are both medical emergencies. There are many things that you can do to help prevent heart disease  and stroke:  Have your blood pressure checked at least every 1-2 years. High blood pressure causes heart disease and increases the risk of stroke.  If you are 53-22 years old, ask your health care provider if you should take aspirin to prevent a heart attack or a stroke.  Do not use any tobacco products, including cigarettes, chewing tobacco, or electronic cigarettes. If you need help quitting, ask your health care provider.  It is important to eat a healthy diet and maintain a healthy weight. ? Be sure to include plenty of vegetables, fruits, low-fat dairy products, and lean protein. ? Avoid eating foods that are high in solid fats, added sugars, or salt (sodium).  Get regular exercise. This is one of the most important things that you can do for your health. ? Try to exercise for at least 150 minutes each week. The type of exercise that you do should increase your heart rate and make you sweat. This is known as moderate-intensity exercise. ? Try to do strengthening exercises at least twice each week. Do these in addition to the moderate-intensity exercise.  Know your numbers.Ask your health care provider to check your cholesterol and your blood glucose. Continue to have your blood tested as directed by your health care provider.  What should I know about cancer screening? There are several types of cancer. Take the following steps to reduce your risk and to catch any cancer development as early as possible. Breast Cancer  Practice breast self-awareness. ? This means understanding how your breasts normally appear and feel. ? It also means doing regular breast self-exams. Let your health care provider know about any changes, no matter how small.  If you are 40  or older, have a clinician do a breast exam (clinical breast exam or CBE) every year. Depending on your age, family history, and medical history, it may be recommended that you also have a yearly breast X-ray (mammogram).  If you  have a family history of breast cancer, talk with your health care provider about genetic screening.  If you are at high risk for breast cancer, talk with your health care provider about having an MRI and a mammogram every year.  Breast cancer (BRCA) gene test is recommended for women who have family members with BRCA-related cancers. Results of the assessment will determine the need for genetic counseling and BRCA1 and for BRCA2 testing. BRCA-related cancers include these types: ? Breast. This occurs in males or females. ? Ovarian. ? Tubal. This may also be called fallopian tube cancer. ? Cancer of the abdominal or pelvic lining (peritoneal cancer). ? Prostate. ? Pancreatic.  Cervical, Uterine, and Ovarian Cancer Your health care provider may recommend that you be screened regularly for cancer of the pelvic organs. These include your ovaries, uterus, and vagina. This screening involves a pelvic exam, which includes checking for microscopic changes to the surface of your cervix (Pap test).  For women ages 21-65, health care providers may recommend a pelvic exam and a Pap test every three years. For women ages 79-65, they may recommend the Pap test and pelvic exam, combined with testing for human papilloma virus (HPV), every five years. Some types of HPV increase your risk of cervical cancer. Testing for HPV may also be done on women of any age who have unclear Pap test results.  Other health care providers may not recommend any screening for nonpregnant women who are considered low risk for pelvic cancer and have no symptoms. Ask your health care provider if a screening pelvic exam is right for you.  If you have had past treatment for cervical cancer or a condition that could lead to cancer, you need Pap tests and screening for cancer for at least 20 years after your treatment. If Pap tests have been discontinued for you, your risk factors (such as having a new sexual partner) need to be  reassessed to determine if you should start having screenings again. Some women have medical problems that increase the chance of getting cervical cancer. In these cases, your health care provider may recommend that you have screening and Pap tests more often.  If you have a family history of uterine cancer or ovarian cancer, talk with your health care provider about genetic screening.  If you have vaginal bleeding after reaching menopause, tell your health care provider.  There are currently no reliable tests available to screen for ovarian cancer.  Lung Cancer Lung cancer screening is recommended for adults 69-62 years old who are at high risk for lung cancer because of a history of smoking. A yearly low-dose CT scan of the lungs is recommended if you:  Currently smoke.  Have a history of at least 30 pack-years of smoking and you currently smoke or have quit within the past 15 years. A pack-year is smoking an average of one pack of cigarettes per day for one year.  Yearly screening should:  Continue until it has been 15 years since you quit.  Stop if you develop a health problem that would prevent you from having lung cancer treatment.  Colorectal Cancer  This type of cancer can be detected and can often be prevented.  Routine colorectal cancer screening usually begins at  age 42 and continues through age 45.  If you have risk factors for colon cancer, your health care provider may recommend that you be screened at an earlier age.  If you have a family history of colorectal cancer, talk with your health care provider about genetic screening.  Your health care provider may also recommend using home test kits to check for hidden blood in your stool.  A small camera at the end of a tube can be used to examine your colon directly (sigmoidoscopy or colonoscopy). This is done to check for the earliest forms of colorectal cancer.  Direct examination of the colon should be repeated every  5-10 years until age 71. However, if early forms of precancerous polyps or small growths are found or if you have a family history or genetic risk for colorectal cancer, you may need to be screened more often.  Skin Cancer  Check your skin from head to toe regularly.  Monitor any moles. Be sure to tell your health care provider: ? About any new moles or changes in moles, especially if there is a change in a mole's shape or color. ? If you have a mole that is larger than the size of a pencil eraser.  If any of your family members has a history of skin cancer, especially at a young age, talk with your health care provider about genetic screening.  Always use sunscreen. Apply sunscreen liberally and repeatedly throughout the day.  Whenever you are outside, protect yourself by wearing long sleeves, pants, a wide-brimmed hat, and sunglasses.  What should I know about osteoporosis? Osteoporosis is a condition in which bone destruction happens more quickly than new bone creation. After menopause, you may be at an increased risk for osteoporosis. To help prevent osteoporosis or the bone fractures that can happen because of osteoporosis, the following is recommended:  If you are 46-71 years old, get at least 1,000 mg of calcium and at least 600 mg of vitamin D per day.  If you are older than age 55 but younger than age 65, get at least 1,200 mg of calcium and at least 600 mg of vitamin D per day.  If you are older than age 54, get at least 1,200 mg of calcium and at least 800 mg of vitamin D per day.  Smoking and excessive alcohol intake increase the risk of osteoporosis. Eat foods that are rich in calcium and vitamin D, and do weight-bearing exercises several times each week as directed by your health care provider. What should I know about how menopause affects my mental health? Depression may occur at any age, but it is more common as you become older. Common symptoms of depression  include:  Low or sad mood.  Changes in sleep patterns.  Changes in appetite or eating patterns.  Feeling an overall lack of motivation or enjoyment of activities that you previously enjoyed.  Frequent crying spells.  Talk with your health care provider if you think that you are experiencing depression. What should I know about immunizations? It is important that you get and maintain your immunizations. These include:  Tetanus, diphtheria, and pertussis (Tdap) booster vaccine.  Influenza every year before the flu season begins.  Pneumonia vaccine.  Shingles vaccine.  Your health care provider may also recommend other immunizations. This information is not intended to replace advice given to you by your health care provider. Make sure you discuss any questions you have with your health care provider. Document Released: 05/22/2005  Document Revised: 10/18/2015 Document Reviewed: 01/01/2015 Elsevier Interactive Patient Education  2018 [employer]  

## 2017-02-23 NOTE — Progress Notes (Signed)
Kelly Myers 1956/03/02 [id number]    History:    Presents for annual exam.  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 Normal Pap and mammogram history. Hypertension, hypothyroidism and tachycardia cardiologist manages labs and meds recommended no estradiol had been on 0.25 estradiol daily, continues with Vagifem but complained of cost.. Osteopenia without elevated FRAX. 2017 tubular adenoma on colonoscopy 5 year recall..  Past medical history, past surgical history, family history and social history were all reviewed and documented in the EPIC chart. Professional dog walker. Mother, father, sister hypertension. 2 daughters, no grandchildren.  ROS:  A ROS was performed and pertinent positives and negatives are included.  Exam:  Vitals:   02/23/17 0946  BP: 124/78  Weight: 129 lb (58.5 kg)  Height: 5\' 4"  (1.626 m)   Body mass index is 22.14 kg/m.   General appearance:  Normal Thyroid:  Symmetrical, normal in size, without palpable masses or nodularity. Respiratory  Auscultation:  Clear without wheezing or rhonchi Cardiovascular  Auscultation:  Regular rate, without rubs, murmurs or gallops  Edema/varicosities:  Not grossly evident Abdominal  Soft,nontender, without masses, guarding or rebound.  Liver/spleen:  No organomegaly noted  Hernia:  None appreciated  Skin  Inspection:  Grossly normal   Breasts: Examined lying and sitting.     Right: Without masses, retractions, discharge or axillary adenopathy.     Left: Without masses, retractions, discharge or axillary adenopathy. Gentitourinary   Inguinal/mons:  Normal without inguinal adenopathy  External genitalia:  Normal  BUS/Urethra/Skene's glands:  Normal  Vagina:  Normal  Cervix: And uterus absent Adnexa/parametria:     Rt: Without masses or tenderness.   Lt: Without masses or tenderness.  Anus and perineum: Normal  Digital rectal exam: Normal sphincter tone without palpated masses or  tenderness  Assessment/Plan:  61 y.o. MWF G2 P2  for annual exam with complaint of hot flushes.  2008 TVH with A&P repair on Vagifem Osteopenia without elevated FRAX Hypertension, hypothyroidism, tachycardia-endocrinologist/cardiologist managing labs and meds.  Plan: Reviewed best not to use by mouth estrogen, will continue with vaginal estrogen cream, coupon given for Estrace vaginal cream, instructed to call if problems with continued high cost. Will use 1 applicator vaginally 2-3 times weekly. SBE's, continue annual screening mammogram, calcium rich diet, vitamin D 2000 daily encouraged. Repeat DEXA. Reviewed importance of continuing weightbearing exercise, yoga, home safety and fall prevention discussed. Discussed Pneumovax/Prevnar 13, has had Shingrex.    Huel Cote [facility], 10:35 AM 02/23/2017

## 2017-02-23 NOTE — Telephone Encounter (Signed)
Angie pharmacist at [facility] called stating asking if patient should have vagifem or estradiol cream 0.1 %. I called and spoke to angie stating pt should have estradiol vaginal cream only. With directions 1 gram at bedtime.

## 2017-03-31 ENCOUNTER — Other Ambulatory Visit: Payer: Self-pay | Admitting: Dermatology

## 2017-03-31 DIAGNOSIS — D492 Neoplasm of unspecified behavior of bone, soft tissue, and skin: Secondary | ICD-10-CM | POA: Diagnosis not present

## 2017-05-04 ENCOUNTER — Other Ambulatory Visit: Payer: Self-pay | Admitting: Cardiovascular Disease

## 2017-07-01 ENCOUNTER — Ambulatory Visit: Payer: BLUE CROSS/BLUE SHIELD | Admitting: Cardiovascular Disease

## 2017-07-05 ENCOUNTER — Ambulatory Visit: Payer: BLUE CROSS/BLUE SHIELD | Admitting: Cardiovascular Disease

## 2017-07-05 ENCOUNTER — Encounter: Payer: Self-pay | Admitting: Cardiovascular Disease

## 2017-07-05 VITALS — BP 158/82 | HR 96 | Ht 64.0 in | Wt 127.4 lb

## 2017-07-05 DIAGNOSIS — I519 Heart disease, unspecified: Secondary | ICD-10-CM

## 2017-07-05 DIAGNOSIS — Z8349 Family history of other endocrine, nutritional and metabolic diseases: Secondary | ICD-10-CM

## 2017-07-05 DIAGNOSIS — E039 Hypothyroidism, unspecified: Secondary | ICD-10-CM | POA: Diagnosis not present

## 2017-07-05 DIAGNOSIS — Z79899 Other long term (current) drug therapy: Secondary | ICD-10-CM | POA: Diagnosis not present

## 2017-07-05 DIAGNOSIS — R002 Palpitations: Secondary | ICD-10-CM | POA: Diagnosis not present

## 2017-07-05 DIAGNOSIS — I10 Essential (primary) hypertension: Secondary | ICD-10-CM

## 2017-07-05 DIAGNOSIS — Z83438 Family history of other disorder of lipoprotein metabolism and other lipidemia: Secondary | ICD-10-CM | POA: Diagnosis not present

## 2017-07-05 DIAGNOSIS — I5189 Other ill-defined heart diseases: Secondary | ICD-10-CM

## 2017-07-05 MED ORDER — NEBIVOLOL HCL 5 MG PO TABS: 5.0000 mg | ORAL_TABLET | Freq: Every day | ORAL | 3 refills | Status: DC

## 2017-07-05 NOTE — Patient Instructions (Signed)
Medication Instructions: START Bystolic 5 mg  --take 1/2 tablet daily for 1 week --if blood pressure/HR still elevated, increase to 5 mg daily  Labwork: Please return for FASTING labs  (CMET, CBC, Lipid, TSH, Mag, LP(a))  Our in office lab hours are Monday-Friday 8:00-4:00, closed for lunch 12:45-1:45 pm.  No appointment needed.  Follow-Up: Your physician wants you to follow-up in: 6 months with Dr. Claiborne Billings.  You will receive a reminder letter in the mail two months in advance. If you don't receive a letter, please call our office to schedule the follow-up appointment.   Any Other Special Instructions Will Be Listed Below (If Applicable).     If you need a refill on your cardiac medications before your next appointment, please call your pharmacy.

## 2017-07-05 NOTE — Progress Notes (Signed)
Cardiology Office Note    Date:  07/06/2017   ID:  CATHYE KREITER, DOB 06-18-1955, MRN [id number]  PCP:  Leighton Ruff, MD  Cardiologist:  Shelva Majestic, MD   Chief Complaint  Patient presents with  . Follow-up    History of Present Illness:  Kelly Myers is a 62 y.o. female who presents for a 6 month follow-up cardiology evaluation.   Kelly Myers has a history of asthma, endometriosis, hypotension, and hypothyroidism.  In November 2017 she presented to the emergency room with generalized fatigue and neck pain.  She is followed by Dr. Forde Dandy for hypothyroidism and is on levothyroxine prior to her admission, she was febrile, and tachycardic.  She had mildly increased d-dimer.  A chest CT was negative for PE.  BMP was minimally increased at 263 and troponin was 0.13.  LFTs were elevated with alkaline phosphatase of 205, ALT 89, AST 51.  I was concerned that she may have had over suppressed thyroid may also have been a contributor to her tachycardia. An echo Doppler study showed an EF of 55-60% with grade 2 diastolic dysfunction.   I felt her mild troponin elevation was demand ischemia rather than ACS.  Her levothyroxine dose was reduced to 100 g.  She developed mild asthma symptoms on the beta blocker was discontinued.    She has a greater than 15 year history of hypertension and was on lisinopril 10 mg daily.  In the past, she had developed hypokalemia on hydrochlorothiazide.  She is on Singulair for her asthma.  She takes Flonase for allergic rhinitis symptoms.    When I last saw her, particularly with her asthma history.  I felt she would do better with ARB therapy and discontinued lisinopril and started initially valsartan at 80 mg with the implants increased to 160 mg daily.  An echo Doppler study did show normal systolic function with grade 2 diastolic dysfunction.  She underwent follow-up laboratory and again her TSH was over suppressed at 0.231.  She is followed by Dr. Tamala Ser because of her thyroid nodules.  He has wanted to keep her TSH in the over suppressed range and as result, he did not make additional adjustment to her thyroid medication.  She was notified that her generic valsartan was from one of the companies that had purchased the tainted valsartan powder from [place].  As result, she was switched to irbesartan, but initially at a very low dose of just 75 mg.  She continues to be on Singulair for asthma, levothyroxine 100 g for her thyroid abnormalities.    I last saw her in September 2018.  Since that time, she has felt well.  She has noticed occasional skipped beats.  She also has noticed some mild blood pressure elevation which seems to at times be labile, but other times entirely normal. She informed me that her sister 12 years ago suffered 2 myocardial infarctions and had an elevated Lpa.  Her father also is the oldest living known person with [name]'s disease at age 32. She denies chest pressure.  He denies PND, orthopnea.  She's not had recent laboratory.  She presents for evaluation.  Past Medical History:  Diagnosis Date  . Asthma    Asthma cough- meds control  . Atrophic vaginitis   . Bleeds easily ([place])    "have had clotting studies, tests, I'm within normal limits" (02/12/2016)  . Endometriosis   . 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  History of blood transfusion 1984   following childbirth.  . History of radiation therapy 1963   after adenoid surgery x 2, they "grew back" and she was treated w/ XRT to the throat  . Hypertension   . Hypothyroidism   . Osteopenia 03/2016   T score -1.3 FRAX 6.5%/0.4% stable from prior DEXA  . Pneumonia    "as a child" (02/12/2016)  . Thyroid nodule   . Uterine prolapse     Past Surgical History:  Procedure Laterality Date  . ADENOIDECTOMY  1960; 1324  . ANTERIOR AND POSTERIOR VAGINAL REPAIR  2000s  . BLADDER SUSPENSION  2000s  . CESAREAN SECTION  1984  . COLONOSCOPY WITH  PROPOFOL N/A 11/04/2015   Procedure: COLONOSCOPY WITH PROPOFOL;  Surgeon: Garlan Fair, MD;  Location: [facility];  Service: Endoscopy;  Laterality: N/A;  . TUBAL LIGATION  1984  . VAGINAL HYSTERECTOMY  2000s   w/bladder suspension and A/P repair  . WISDOM TOOTH EXTRACTION  1974    Current Medications: Outpatient Medications Prior to Visit  Medication Sig Dispense Refill  . Calcium Carbonate-Vitamin D (CALCIUM + D PO) Take 1 tablet by mouth daily.     . Cetirizine HCl (ZYRTEC ALLERGY) 10 MG CAPS Take 10 mg by mouth daily.     Marland Kitchen estradiol (ESTRACE VAGINAL) 0.1 MG/GM vaginal cream Place 1 Applicatorful at bedtime vaginally. 42.5 g 12  . fluticasone (FLONASE) 50 MCG/ACT nasal spray Place 2 sprays into both nostrils daily.    Marland Kitchen glucosamine-chondroitin 500-400 MG tablet Take 1 tablet by mouth daily.     . irbesartan (AVAPRO) 150 MG tablet TAKE 1 TABLET ONCE DAILY. 30 tablet 11  . levothyroxine (SYNTHROID, LEVOTHROID) 100 MCG tablet Take 1 tablet (100 mcg total) by mouth daily before breakfast. 30 tablet 0  . montelukast (SINGULAIR) 10 MG tablet Take 10 mg by mouth at bedtime.      . Probiotic Product (PROBIOTIC DAILY PO) Take 1 capsule by mouth daily.      No facility-administered medications prior to visit.      Allergies:   Macrodantin [nitrofurantoin] and Neosporin [neomycin-bacitracin zn-polymyx]   Social History   Socioeconomic History  . Marital status: Married    Spouse name: Not on file  . Number of children: Not on file  . Years of education: Not on file  . Highest education level: Not on file  Occupational History  . Not on file  Social Needs  . Financial resource strain: Not on file  . Food insecurity:    Worry: Not on file    Inability: Not on file  . Transportation needs:    Medical: Not on file    Non-medical: Not on file  Tobacco Use  . Smoking status: Former Smoker    Packs/day: 0.50    Years: 10.00    Pack years: 5.00    Types: Cigarettes    Last  attempt to quit: 1983    Years since quitting: 36.2  . Smokeless tobacco: Never Used  Substance and Sexual Activity  . Alcohol use: Yes    Alcohol/week: 7.2 oz    Types: 5 Standard drinks or equivalent, 7 Glasses of wine per week  . Drug use: No  . Sexual activity: Yes    Birth control/protection: Surgical  Lifestyle  . Physical activity:    Days per week: Not on file    Minutes per session: Not on file  . Stress: Not on file  Relationships  . Social connections:  Talks on phone: Not on file    Gets together: Not on file    Attends religious service: Not on file    Active member of club or organization: Not on file    Attends meetings of clubs or organizations: Not on file    Relationship status: Not on file  Other Topics Concern  . Not on file  Social History Narrative  . Not on file     Family History:  The patient's family history includes Breast cancer in her paternal grandmother; Cancer in her father; Heart attack in her sister; Heart disease in her maternal grandfather and sister; Hypertension in her father, mother, and sister.   ROS General: Negative; No fevers, chills, or night sweats;  HEENT: Negative; No changes in vision or hearing, sinus congestion, difficulty swallowing Pulmonary: Positive for mild asthma Cardiovascular: Negative; No chest pain, presyncope, syncope, palpitations GI: Negative; No nausea, vomiting, diarrhea, or abdominal pain GU: Positive for endometriosis Musculoskeletal: Negative; no myalgias, joint pain, or weakness Hematologic/Oncology: Negative; no easy bruising, bleeding Endocrine: Positive for hypothyroidism Neuro: Negative; no changes in balance, headaches Skin: Negative; No rashes or skin lesions Psychiatric: Negative; No behavioral problems, depression Sleep: Negative; No snoring, daytime sleepiness, hypersomnolence, bruxism, restless legs, hypnogognic hallucinations, no cataplexy Other comprehensive 14 point system review is  negative.   PHYSICAL EXAM:   VS:  BP (!) 158/82   Pulse 96   Ht 5' 4"  (1.626 m)   Wt 127 lb 6.4 oz (57.8 kg)   BMI 21.87 kg/m     Repeat blood pressure by me was 144/80  Wt Readings from Last 3 Encounters:  07/05/17 127 lb 6.4 oz (57.8 kg)  02/23/17 129 lb (58.5 kg)  12/15/16 126 lb (57.2 kg)    General: Alert, oriented, no distress.  Skin: normal turgor, no rashes, warm and dry HEENT: Normocephalic, atraumatic. Pupils equal round and reactive to light; sclera anicteric; extraocular muscles intact;  Nose without nasal septal hypertrophy Mouth/Parynx benign; Mallinpatti scale 2 Neck: No JVD, no carotid bruits; normal carotid upstroke Lungs: clear to ausculatation and percussion; no wheezing or rales Chest wall: without tenderness to palpitation Heart: PMI not displaced, RRR with occasional skipped beats, s1 s2 normal, 1/6 systolic murmur, no diastolic murmur, no rubs, gallops, thrills, or heaves Abdomen: soft, nontender; no hepatosplenomehaly, BS+; abdominal aorta nontender and not dilated by palpation. Back: no CVA tenderness Pulses 2+ Musculoskeletal: full range of motion, normal strength, no joint deformities Extremities: no clubbing cyanosis or edema, Homan's sign negative  Neurologic: grossly nonfocal; Cranial nerves grossly wnl Psychologic: Normal mood and affect   Studies/Labs Reviewed:   EKG:  EKG is ordered today.  ECG (independently read by me): normal sinus rhythm with mild sinus arrhythmia.  No significant ST changes.  Normal intervals.  September 2018ECG (independently read by me): Normal sinus rhythm with an isolated PVC.  No signal ST-T changes.  QTc interval 448 Kelly.  June 2018 ECG (independently read by me): Normal sinus rhythm at 85 bpm with occasional PACs.  Possible left atrial enlargement.  Poor R wave progression anteriorly.  QTc interval 456.  PR interval 172 Kelly.  Recent Labs: BMP Latest Ref Rng & Units 10/08/2016 02/14/2016 02/13/2016  Glucose 65 - 99  mg/dL 108(H) 110(H) 112(H)  BUN 8 - 27 mg/dL 17 <5(L) <5(L)  Creatinine 0.57 - 1.00 mg/dL 0.75 0.57 0.56  BUN/Creat Ratio 12 - 28 23 - -  Sodium 134 - 144 mmol/L 137 139 140  Potassium 3.5 -  5.2 mmol/L 4.5 4.0 3.8  Chloride 96 - 106 mmol/L 98 108 110  CO2 20 - 29 mmol/L 25 25 24   Calcium 8.7 - 10.3 mg/dL 9.8 9.1 8.6(L)     Hepatic Function Latest Ref Rng & Units 10/08/2016 02/14/2016 02/13/2016  Total Protein 6.0 - 8.5 g/dL 6.7 5.7(L) 5.7(L)  Albumin 3.6 - 4.8 g/dL 4.6 2.8(L) 2.9(L)  AST 0 - 40 IU/L 28 38 53(H)  ALT 0 - 32 IU/L 29 70(H) 80(H)  Alk Phosphatase 39 - 117 IU/L 59 187(H) 190(H)  Total Bilirubin 0.0 - 1.2 mg/dL 0.5 0.8 1.0    CBC Latest Ref Rng & Units 10/08/2016 02/14/2016 02/13/2016  WBC 3.4 - 10.8 x10E3/uL 6.1 5.4 5.5  Hemoglobin 11.1 - 15.9 g/dL 13.8 10.6(L) 11.1(L)  Hematocrit 34.0 - 46.6 % 40.3 32.1(L) 33.5(L)  Platelets 150 - 379 x10E3/uL 164 159 146(L)   Lab Results  Component Value Date   MCV 88 10/08/2016   MCV 88.7 02/14/2016   MCV 88.4 02/13/2016   Lab Results  Component Value Date   TSH 0.231 (L) 10/08/2016   No results found for: HGBA1C   BNP    Component Value Date/Time   BNP 263.0 (H) 02/12/2016 1220    ProBNP No results found for: PROBNP   Lipid Panel  No results found for: CHOL, TRIG, HDL, CHOLHDL, VLDL, LDLCALC, LDLDIRECT   RADIOLOGY: No results found.   Additional studies/ records that were reviewed today include:  Reviewed her hospital admission records from November 2017.  I reviewed her follow-up office visit with Rosaria Ferries.  Laboratory was reviewed as was her echo Doppler data.    ASSESSMENT:    1. Essential hypertension   2. Hypothyroidism, unspecified type   3. Diastolic dysfunction   4. Medication management   5. Family history of other disorder of lipoprotein metabolism and other lipidemia   6. Family history of [name]'s disease   7. Palpitations      PLAN:   Kelly Myers is a 62 year old female who has a  long-standing history of hypertension, as well as a history of hypothyroidism for which she is on levothyroxine replacement followed by Dr. Forde Dandy. Remotely, she has been documented to have thyroid nodules and for this reason, Dr. Forde Dandy has consistently try to keep her TSH over suppressed.  She also has a history of asthma and endometriosis.  I had seen her during her hospitalization in November 2017 when she was tachycardic and TSH was over suppressed.  Her last  echo Doppler study in November 2017 showed  normal systolic function with grade 2 diastolic dysfunction.  Because of her asthma, I switched her from Ace inhibition to ARB therapy.  Unfortunately, she was prescribed valsartan from the generic company where there was tainted impurities due to the company in [place].  She was switched to low-dose irbesartan.  When I lost saw her, her blood pressure was elevated on 75 mg of irbesartan and her dose was titrated to 150 mg.  On exam, she has frequent intermittent pauses.  She has noticed some occasional palpitations.  Her blood pressure today is elevated.  I'm electing to add Bystolic and have provided her with 5 mg samples.  She will start 2.5 mg for the first week and depending upon her blood pressure response and sensation of skipped beats.  Her dose will be titrated to 5 mg.  I am scheduling her for complete set of fasting laboratory and will also include an Lpa and right of  her family history of elevation.  I will contact her regarding her laboratory results and adjustments need to be made.  If she remains stable, I will see her in 6 months for reevaluation.  Medication Adjustments/Labs and Tests Ordered: Current medicines are reviewed at length with the patient today.  Concerns regarding medicines are outlined above.  Medication changes, Labs and Tests ordered today are listed in the Patient Instructions below. Patient Instructions  Medication Instructions: START Bystolic 5 mg  --take 1/2 tablet daily  for 1 week --if blood pressure/HR still elevated, increase to 5 mg daily  Labwork: Please return for FASTING labs  (CMET, CBC, Lipid, TSH, Mag, LP(a))  Our in office lab hours are Monday-Friday 8:00-4:00, closed for lunch 12:45-1:45 pm.  No appointment needed.  Follow-Up: Your physician wants you to follow-up in: 6 months with Dr. Claiborne Billings.  You will receive a reminder letter in the mail two months in advance. If you don't receive a letter, please call our office to schedule the follow-up appointment.   Any Other Special Instructions Will Be Listed Below (If Applicable).     If you need a refill on your cardiac medications before your next appointment, please call your pharmacy.      Signed, Shelva Majestic, MD, [facility] 07/06/2017 2:13 PM    [facility] [address], [place], [place], [place]  [address] Phone: [phone]

## 2017-07-06 ENCOUNTER — Encounter: Payer: Self-pay | Admitting: Cardiovascular Disease

## 2017-07-06 DIAGNOSIS — Z79899 Other long term (current) drug therapy: Secondary | ICD-10-CM | POA: Diagnosis not present

## 2017-07-06 DIAGNOSIS — I10 Essential (primary) hypertension: Secondary | ICD-10-CM | POA: Diagnosis not present

## 2017-07-06 DIAGNOSIS — E039 Hypothyroidism, unspecified: Secondary | ICD-10-CM | POA: Diagnosis not present

## 2017-07-06 DIAGNOSIS — I519 Heart disease, unspecified: Secondary | ICD-10-CM | POA: Diagnosis not present

## 2017-07-08 LAB — LIPID PANEL
Chol/HDL Ratio: 2.2 ratio (ref 0.0–4.4)
Cholesterol, Total: 182 mg/dL (ref 100–199)
HDL: 81 mg/dL (ref >39)
LDL CALC: 86 mg/dL (ref 0–99)
TRIGLYCERIDES: 73 mg/dL (ref 0–149)
VLDL Cholesterol Cal: 15 mg/dL (ref 5–40)

## 2017-07-08 LAB — COMPREHENSIVE METABOLIC PANEL
A/G RATIO: 2.1 (ref 1.2–2.2)
ALT: 24 IU/L (ref 0–32)
AST: 26 IU/L (ref 0–40)
Albumin: 4.5 g/dL (ref 3.6–4.8)
Alkaline Phosphatase: 69 IU/L (ref 39–117)
BUN/Creatinine Ratio: 20 (ref 12–28)
BUN: 17 mg/dL (ref 8–27)
Bilirubin Total: 0.6 mg/dL (ref 0.0–1.2)
CO2: 25 mmol/L (ref 20–29)
Calcium: 9.4 mg/dL (ref 8.7–10.3)
Chloride: 102 mmol/L (ref 96–106)
Creatinine, Ser: 0.84 mg/dL (ref 0.57–1.00)
GFR calc non Af Amer: 75 mL/min/{1.73_m2} (ref >59)
GFR, EST AFRICAN AMERICAN: 86 mL/min/{1.73_m2} (ref >59)
Globulin, Total: 2.1 g/dL (ref 1.5–4.5)
Glucose: 78 mg/dL (ref 65–99)
POTASSIUM: 4.2 mmol/L (ref 3.5–5.2)
Sodium: 141 mmol/L (ref 134–144)
TOTAL PROTEIN: 6.6 g/dL (ref 6.0–8.5)

## 2017-07-08 LAB — CBC
HEMOGLOBIN: 14.1 g/dL (ref 11.1–15.9)
Hematocrit: 42.5 % (ref 34.0–46.6)
MCH: 29.9 pg (ref 26.6–33.0)
MCHC: 33.2 g/dL (ref 31.5–35.7)
MCV: 90 fL (ref 79–97)
PLATELETS: 164 10*3/uL (ref 150–379)
RBC: 4.72 x10E6/uL (ref 3.77–5.28)
RDW: 13.4 % (ref 12.3–15.4)
WBC: 4.3 10*3/uL (ref 3.4–10.8)

## 2017-07-08 LAB — TSH: TSH: 1.16 u[IU]/mL (ref 0.450–4.500)

## 2017-07-08 LAB — MAGNESIUM: Magnesium: 2.2 mg/dL (ref 1.6–2.3)

## 2017-07-08 LAB — LIPOPROTEIN A (LPA): LIPOPROTEIN (A): 90 nmol/L — AB (ref <75)

## 2017-07-22 ENCOUNTER — Encounter: Payer: Self-pay | Admitting: Women's Health

## 2017-07-28 ENCOUNTER — Encounter: Payer: Self-pay | Admitting: Cardiovascular Disease

## 2017-07-28 DIAGNOSIS — J309 Allergic rhinitis, unspecified: Secondary | ICD-10-CM | POA: Diagnosis not present

## 2017-07-28 DIAGNOSIS — R05 Cough: Secondary | ICD-10-CM | POA: Diagnosis not present

## 2017-08-06 DIAGNOSIS — H1013 Acute atopic conjunctivitis, bilateral: Secondary | ICD-10-CM | POA: Diagnosis not present

## 2017-08-11 ENCOUNTER — Other Ambulatory Visit: Payer: Self-pay | Admitting: *Deleted

## 2017-08-11 MED ORDER — IRBESARTAN 150 MG PO TABS: 150.0000 mg | ORAL_TABLET | Freq: Every day | ORAL | 3 refills | Status: DC

## 2017-08-11 MED ORDER — NEBIVOLOL HCL 2.5 MG PO TABS: 2.5000 mg | ORAL_TABLET | Freq: Every day | ORAL | 3 refills | Status: DC

## 2017-08-13 ENCOUNTER — Encounter: Payer: Self-pay | Admitting: Women's Health

## 2017-08-16 ENCOUNTER — Encounter: Payer: Self-pay | Admitting: Cardiovascular Disease

## 2017-08-16 ENCOUNTER — Other Ambulatory Visit: Payer: Self-pay | Admitting: Women's Health

## 2017-08-16 MED ORDER — ESTRADIOL 0.5 MG PO TABS: ORAL_TABLET | ORAL | 4 refills | Status: DC

## 2017-08-17 ENCOUNTER — Telehealth: Payer: Self-pay | Admitting: *Deleted

## 2017-08-17 NOTE — Telephone Encounter (Signed)
PA submitted for Bystolic via Covermymeds.   Awaiting review.

## 2017-08-20 ENCOUNTER — Other Ambulatory Visit: Payer: Self-pay | Admitting: *Deleted

## 2017-08-20 ENCOUNTER — Encounter: Payer: Self-pay | Admitting: *Deleted

## 2017-08-20 MED ORDER — BISOPROLOL FUMARATE 5 MG PO TABS: 5.0000 mg | ORAL_TABLET | Freq: Every day | ORAL | 3 refills | Status: DC

## 2017-08-23 NOTE — Telephone Encounter (Signed)
Bystolic denied by insurance.   Per Dr. Claiborne Billings, change to bisoprolol 5 mg.  Patient aware, rx sent.

## 2017-08-26 DIAGNOSIS — H2513 Age-related nuclear cataract, bilateral: Secondary | ICD-10-CM | POA: Diagnosis not present

## 2017-09-01 ENCOUNTER — Telehealth: Payer: Self-pay

## 2017-09-01 NOTE — Telephone Encounter (Signed)
Okay compounded estradiol vaginal cream 0.02% please call in for patient.  1 applicator 3 times weekly dispense 1 tube with refills.

## 2017-09-01 NOTE — Telephone Encounter (Signed)
Pharmacy contacted you because patient finds the price of Estradiol Vaginal cream 0.01% to be too expensive with her insurance.  They suggested alternative Compound estradiol vaginal cream 0.02% 30 gram tube = $60.00.  Please advise.

## 2017-09-02 ENCOUNTER — Encounter: Payer: Self-pay | Admitting: Cardiovascular Disease

## 2017-09-02 ENCOUNTER — Other Ambulatory Visit: Payer: Self-pay | Admitting: Women's Health

## 2017-09-02 ENCOUNTER — Encounter: Payer: Self-pay | Admitting: Women's Health

## 2017-09-02 MED ORDER — NONFORMULARY OR COMPOUNDED ITEM: 2 refills | Status: DC

## 2017-09-02 MED ORDER — NONFORMULARY OR COMPOUNDED ITEM: 5 refills | Status: DC

## 2017-09-02 NOTE — Telephone Encounter (Signed)
Patient contact me about this by My Chart email. I informed her [place] fine with her using the compounded Estradiol Vag Cream. I asked her to verify pharmacy she wants me to send it to. Will send Rx when I hear from her.

## 2017-09-02 NOTE — Telephone Encounter (Signed)
Rx sent to [facility] per patient request.

## 2017-09-08 ENCOUNTER — Other Ambulatory Visit: Payer: Self-pay | Admitting: *Deleted

## 2017-09-08 ENCOUNTER — Encounter: Payer: Self-pay | Admitting: *Deleted

## 2017-09-08 MED ORDER — NEBIVOLOL HCL 2.5 MG PO TABS: 2.5000 mg | ORAL_TABLET | Freq: Every day | ORAL | 3 refills | Status: DC

## 2017-09-22 ENCOUNTER — Other Ambulatory Visit: Payer: Self-pay | Admitting: Podiatry

## 2017-09-22 ENCOUNTER — Ambulatory Visit (INDEPENDENT_AMBULATORY_CARE_PROVIDER_SITE_OTHER): Payer: BLUE CROSS/BLUE SHIELD | Admitting: Podiatry

## 2017-09-22 ENCOUNTER — Encounter: Payer: Self-pay | Admitting: Podiatry

## 2017-09-22 ENCOUNTER — Ambulatory Visit (INDEPENDENT_AMBULATORY_CARE_PROVIDER_SITE_OTHER): Payer: BLUE CROSS/BLUE SHIELD

## 2017-09-22 ENCOUNTER — Other Ambulatory Visit: Payer: Self-pay

## 2017-09-22 VITALS — BP 125/71 | HR 63

## 2017-09-22 DIAGNOSIS — M779 Enthesopathy, unspecified: Secondary | ICD-10-CM

## 2017-09-22 DIAGNOSIS — M79672 Pain in left foot: Secondary | ICD-10-CM

## 2017-09-22 DIAGNOSIS — M778 Other enthesopathies, not elsewhere classified: Secondary | ICD-10-CM

## 2017-09-22 DIAGNOSIS — M79671 Pain in right foot: Secondary | ICD-10-CM

## 2017-09-22 MED ORDER — TRIAMCINOLONE ACETONIDE 10 MG/ML IJ SUSP
10.0000 mg | Freq: Once | INTRAMUSCULAR | Status: AC
  Administered 2017-09-22: 10 mg

## 2017-09-22 NOTE — Progress Notes (Signed)
Subjective:   Patient ID: Kelly Myers, female   DOB: 62 y.o.   MRN: [id number]   HPI Patient presents stating she is had a lot of pain in her big toe joint left and has a lesion on the bottom of her right foot that is been bothering her   ROS      Objective:  Physical Exam  Neurovascular status intact with inflammation of the first MPJ left lateral side with a small lesion plantar right that has a lucent core     Assessment:  Hallux limitus deformity left with inflammation and porokeratotic lesion right     Plan:  H&P conditions reviewed and carefully injected the lateral side of the first MPJ left 3 mg Kenalog 5 mg Xylocaine and debrided lesion plantar right with no iatrogenic bleeding noted  X-rays were negative for signs of arthritis in the joint or indications of stress fracture fracture about

## 2017-11-20 ENCOUNTER — Encounter: Payer: Self-pay | Admitting: Podiatry

## 2017-11-24 ENCOUNTER — Telehealth: Payer: Self-pay | Admitting: *Deleted

## 2017-11-24 ENCOUNTER — Other Ambulatory Visit: Payer: Self-pay | Admitting: *Deleted

## 2017-11-24 MED ORDER — DICLOFENAC SODIUM 75 MG PO TBEC: 75.0000 mg | DELAYED_RELEASE_TABLET | Freq: Two times a day (BID) | ORAL | 2 refills | Status: DC

## 2017-11-24 NOTE — Telephone Encounter (Signed)
Patient called and stated that the left big toe joint area was bothering me and per Dr Paulla Dolly prescribed Diclofenac 75 mg and 30 tablets with 2 refills. Kelly Myers

## 2017-12-23 DIAGNOSIS — E039 Hypothyroidism, unspecified: Secondary | ICD-10-CM | POA: Diagnosis not present

## 2017-12-23 DIAGNOSIS — I10 Essential (primary) hypertension: Secondary | ICD-10-CM | POA: Diagnosis not present

## 2017-12-23 DIAGNOSIS — Z23 Encounter for immunization: Secondary | ICD-10-CM | POA: Diagnosis not present

## 2017-12-23 DIAGNOSIS — J309 Allergic rhinitis, unspecified: Secondary | ICD-10-CM | POA: Diagnosis not present

## 2018-01-28 ENCOUNTER — Other Ambulatory Visit: Payer: Self-pay | Admitting: Family Medicine

## 2018-01-28 DIAGNOSIS — Z1231 Encounter for screening mammogram for malignant neoplasm of breast: Secondary | ICD-10-CM

## 2018-02-04 DIAGNOSIS — E038 Other specified hypothyroidism: Secondary | ICD-10-CM | POA: Diagnosis not present

## 2018-02-04 DIAGNOSIS — I10 Essential (primary) hypertension: Secondary | ICD-10-CM | POA: Diagnosis not present

## 2018-02-04 DIAGNOSIS — D126 Benign neoplasm of colon, unspecified: Secondary | ICD-10-CM | POA: Diagnosis not present

## 2018-02-04 DIAGNOSIS — E042 Nontoxic multinodular goiter: Secondary | ICD-10-CM | POA: Diagnosis not present

## 2018-02-04 DIAGNOSIS — M8580 Other specified disorders of bone density and structure, unspecified site: Secondary | ICD-10-CM | POA: Diagnosis not present

## 2018-02-07 ENCOUNTER — Telehealth: Payer: Self-pay | Admitting: Cardiovascular Disease

## 2018-02-07 NOTE — Telephone Encounter (Signed)
New Message        Pt c/o medication issue:  1. Name of Medication: Bystolic 2.5  2. How are you currently taking this medication (dosage and times per day)? 1 x day   3. Are you having a reaction (difficulty breathing--STAT)? No   4. What is your medication issue? Twice as expensive as last month. Need something less expensive, or a coupon.

## 2018-02-07 NOTE — Telephone Encounter (Signed)
Returned call to patient she stated Bystolic too expensive.Stated cost doubled last month.Stated she would like a cheaper medication.Message sent to our pharmacist for advice.

## 2018-02-07 NOTE — Telephone Encounter (Signed)
Dr Claiborne Billings is working on this. Waiting for his recommendation.

## 2018-02-09 MED ORDER — BISOPROLOL FUMARATE 5 MG PO TABS: 5.0000 mg | ORAL_TABLET | Freq: Every day | ORAL | 3 refills | Status: DC

## 2018-03-01 ENCOUNTER — Encounter: Payer: Self-pay | Admitting: Women's Health

## 2018-03-01 ENCOUNTER — Ambulatory Visit (INDEPENDENT_AMBULATORY_CARE_PROVIDER_SITE_OTHER): Payer: BLUE CROSS/BLUE SHIELD | Admitting: Women's Health

## 2018-03-01 VITALS — BP 124/80 | Ht 64.0 in | Wt 129.0 lb

## 2018-03-01 DIAGNOSIS — Z01419 Encounter for gynecological examination (general) (routine) without abnormal findings: Secondary | ICD-10-CM

## 2018-03-01 DIAGNOSIS — Z13820 Encounter for screening for osteoporosis: Secondary | ICD-10-CM | POA: Diagnosis not present

## 2018-03-01 MED ORDER — NONFORMULARY OR COMPOUNDED ITEM: 4 refills | Status: DC

## 2018-03-01 MED ORDER — ESTRADIOL 0.5 MG PO TABS: ORAL_TABLET | ORAL | 4 refills | Status: DC

## 2018-03-01 NOTE — Progress Notes (Signed)
Kelly Myers 11/26/1955 [id number]    History:    Presents for annual exam.  2008 TVH and A&P repair on vaginal estrogen with good relief of dryness and 0.25 mg estradiol daily.  Normal Pap and mammogram history.  2017 T score -1.3 at the spine, hips normal FRAX 6.5% / 0.4%.  Primary care manages hypertension and  hypothyroidism.  2017 benign colon polyp 5-year follow-up.  Has had Shingrex and flu vaccine.  Past medical history, past surgical history, family history and social history were all reviewed and documented in the EPIC chart.  Professional dog walker.  Parents, sister hypertension.  2 daughters, no grandchildren.  ROS:  A ROS was performed and pertinent positives and negatives are included.  Exam:  Vitals:   03/01/18 0934  BP: 124/80  Weight: 129 lb (58.5 kg)  Height: 5\' 4"  (1.626 m)   Body mass index is 22.14 kg/m.   General appearance:  Normal Thyroid:  Symmetrical, normal in size, without palpable masses or nodularity. Respiratory  Auscultation:  Clear without wheezing or rhonchi Cardiovascular  Auscultation:  Regular rate, without rubs, murmurs or gallops  Edema/varicosities:  Not grossly evident Abdominal  Soft,nontender, without masses, guarding or rebound.  Liver/spleen:  No organomegaly noted  Hernia:  None appreciated  Skin  Inspection:  Grossly normal   Breasts: Examined lying and sitting.     Right: Without masses, retractions, discharge or axillary adenopathy.     Left: Without masses, retractions, discharge or axillary adenopathy. Gentitourinary   Inguinal/mons:  Normal without inguinal adenopathy  External genitalia:  Normal  BUS/Urethra/Skene's glands:  Normal  Vagina:  Normal  Cervix: And uterus absent  Adnexa/parametria:     Rt: Without masses or tenderness.   Lt: Without masses or tenderness.  Anus and perineum: Normal  Digital rectal exam: Normal sphincter tone without palpated masses or tenderness  Assessment/Plan:  62 y.o. MWF G2, P2  for annual exam with no complaints.     2008 TVH with A&P repair on HRT Osteopenia without elevated FRAX Hypertension, hypothyroidism-primary care manages labs and meds  Plan: We will continue the compounded vaginal estrogen twice weekly and 0.25 Estrace daily.  Reviewed slight risks of blood clots and strokes.  Continue active lifestyle of regular exercise, yoga, calcium rich foods, vitamin D 2000 daily.  Home safety, fall prevention discussed.  Repeat DEXA.Marland Kitchen  Huel Cote [facility], 10:35 AM 03/01/2018

## 2018-03-01 NOTE — Patient Instructions (Signed)
Health Maintenance for Postmenopausal Women Menopause is a normal process in which your reproductive ability comes to an end. This process happens gradually over a span of months to years, usually between the ages of 22 and 9. Menopause is complete when you have missed 12 consecutive menstrual periods. It is important to talk with your health care provider about some of the most common conditions that affect postmenopausal women, such as heart disease, cancer, and bone loss (osteoporosis). Adopting a healthy lifestyle and getting preventive care can help to promote your health and wellness. Those actions can also lower your chances of developing some of these common conditions. What should I know about menopause? During menopause, you may experience a number of symptoms, such as:  Moderate-to-severe hot flashes.  Night sweats.  Decrease in sex drive.  Mood swings.  Headaches.  Tiredness.  Irritability.  Memory problems.  Insomnia.  Choosing to treat or not to treat menopausal changes is an individual decision that you make with your health care provider. What should I know about hormone replacement therapy and supplements? Hormone therapy products are effective for treating symptoms that are associated with menopause, such as hot flashes and night sweats. Hormone replacement carries certain risks, especially as you become older. If you are thinking about using estrogen or estrogen with progestin treatments, discuss the benefits and risks with your health care provider. What should I know about heart disease and stroke? Heart disease, heart attack, and stroke become more likely as you age. This may be due, in part, to the hormonal changes that your body experiences during menopause. These can affect how your body processes dietary fats, triglycerides, and cholesterol. Heart attack and stroke are both medical emergencies. There are many things that you can do to help prevent heart disease  and stroke:  Have your blood pressure checked at least every 1-2 years. High blood pressure causes heart disease and increases the risk of stroke.  If you are 53-22 years old, ask your health care provider if you should take aspirin to prevent a heart attack or a stroke.  Do not use any tobacco products, including cigarettes, chewing tobacco, or electronic cigarettes. If you need help quitting, ask your health care provider.  It is important to eat a healthy diet and maintain a healthy weight. ? Be sure to include plenty of vegetables, fruits, low-fat dairy products, and lean protein. ? Avoid eating foods that are high in solid fats, added sugars, or salt (sodium).  Get regular exercise. This is one of the most important things that you can do for your health. ? Try to exercise for at least 150 minutes each week. The type of exercise that you do should increase your heart rate and make you sweat. This is known as moderate-intensity exercise. ? Try to do strengthening exercises at least twice each week. Do these in addition to the moderate-intensity exercise.  Know your numbers.Ask your health care provider to check your cholesterol and your blood glucose. Continue to have your blood tested as directed by your health care provider.  What should I know about cancer screening? There are several types of cancer. Take the following steps to reduce your risk and to catch any cancer development as early as possible. Breast Cancer  Practice breast self-awareness. ? This means understanding how your breasts normally appear and feel. ? It also means doing regular breast self-exams. Let your health care provider know about any changes, no matter how small.  If you are 40  or older, have a clinician do a breast exam (clinical breast exam or CBE) every year. Depending on your age, family history, and medical history, it may be recommended that you also have a yearly breast X-ray (mammogram).  If you  have a family history of breast cancer, talk with your health care provider about genetic screening.  If you are at high risk for breast cancer, talk with your health care provider about having an MRI and a mammogram every year.  Breast cancer (BRCA) gene test is recommended for women who have family members with BRCA-related cancers. Results of the assessment will determine the need for genetic counseling and BRCA1 and for BRCA2 testing. BRCA-related cancers include these types: ? Breast. This occurs in males or females. ? Ovarian. ? Tubal. This may also be called fallopian tube cancer. ? Cancer of the abdominal or pelvic lining (peritoneal cancer). ? Prostate. ? Pancreatic.  Cervical, Uterine, and Ovarian Cancer Your health care provider may recommend that you be screened regularly for cancer of the pelvic organs. These include your ovaries, uterus, and vagina. This screening involves a pelvic exam, which includes checking for microscopic changes to the surface of your cervix (Pap test).  For women ages 21-65, health care providers may recommend a pelvic exam and a Pap test every three years. For women ages 79-65, they may recommend the Pap test and pelvic exam, combined with testing for human papilloma virus (HPV), every five years. Some types of HPV increase your risk of cervical cancer. Testing for HPV may also be done on women of any age who have unclear Pap test results.  Other health care providers may not recommend any screening for nonpregnant women who are considered low risk for pelvic cancer and have no symptoms. Ask your health care provider if a screening pelvic exam is right for you.  If you have had past treatment for cervical cancer or a condition that could lead to cancer, you need Pap tests and screening for cancer for at least 20 years after your treatment. If Pap tests have been discontinued for you, your risk factors (such as having a new sexual partner) need to be  reassessed to determine if you should start having screenings again. Some women have medical problems that increase the chance of getting cervical cancer. In these cases, your health care provider may recommend that you have screening and Pap tests more often.  If you have a family history of uterine cancer or ovarian cancer, talk with your health care provider about genetic screening.  If you have vaginal bleeding after reaching menopause, tell your health care provider.  There are currently no reliable tests available to screen for ovarian cancer.  Lung Cancer Lung cancer screening is recommended for adults 69-62 years old who are at high risk for lung cancer because of a history of smoking. A yearly low-dose CT scan of the lungs is recommended if you:  Currently smoke.  Have a history of at least 30 pack-years of smoking and you currently smoke or have quit within the past 15 years. A pack-year is smoking an average of one pack of cigarettes per day for one year.  Yearly screening should:  Continue until it has been 15 years since you quit.  Stop if you develop a health problem that would prevent you from having lung cancer treatment.  Colorectal Cancer  This type of cancer can be detected and can often be prevented.  Routine colorectal cancer screening usually begins at  age 42 and continues through age 45.  If you have risk factors for colon cancer, your health care provider may recommend that you be screened at an earlier age.  If you have a family history of colorectal cancer, talk with your health care provider about genetic screening.  Your health care provider may also recommend using home test kits to check for hidden blood in your stool.  A small camera at the end of a tube can be used to examine your colon directly (sigmoidoscopy or colonoscopy). This is done to check for the earliest forms of colorectal cancer.  Direct examination of the colon should be repeated every  5-10 years until age 71. However, if early forms of precancerous polyps or small growths are found or if you have a family history or genetic risk for colorectal cancer, you may need to be screened more often.  Skin Cancer  Check your skin from head to toe regularly.  Monitor any moles. Be sure to tell your health care provider: ? About any new moles or changes in moles, especially if there is a change in a mole's shape or color. ? If you have a mole that is larger than the size of a pencil eraser.  If any of your family members has a history of skin cancer, especially at a Tito Ausmus age, talk with your health care provider about genetic screening.  Always use sunscreen. Apply sunscreen liberally and repeatedly throughout the day.  Whenever you are outside, protect yourself by wearing long sleeves, pants, a wide-brimmed hat, and sunglasses.  What should I know about osteoporosis? Osteoporosis is a condition in which bone destruction happens more quickly than new bone creation. After menopause, you may be at an increased risk for osteoporosis. To help prevent osteoporosis or the bone fractures that can happen because of osteoporosis, the following is recommended:  If you are 46-71 years old, get at least 1,000 mg of calcium and at least 600 mg of vitamin D per day.  If you are older than age 55 but younger than age 65, get at least 1,200 mg of calcium and at least 600 mg of vitamin D per day.  If you are older than age 54, get at least 1,200 mg of calcium and at least 800 mg of vitamin D per day.  Smoking and excessive alcohol intake increase the risk of osteoporosis. Eat foods that are rich in calcium and vitamin D, and do weight-bearing exercises several times each week as directed by your health care provider. What should I know about how menopause affects my mental health? Depression may occur at any age, but it is more common as you become older. Common symptoms of depression  include:  Low or sad mood.  Changes in sleep patterns.  Changes in appetite or eating patterns.  Feeling an overall lack of motivation or enjoyment of activities that you previously enjoyed.  Frequent crying spells.  Talk with your health care provider if you think that you are experiencing depression. What should I know about immunizations? It is important that you get and maintain your immunizations. These include:  Tetanus, diphtheria, and pertussis (Tdap) booster vaccine.  Influenza every year before the flu season begins.  Pneumonia vaccine.  Shingles vaccine.  Your health care provider may also recommend other immunizations. This information is not intended to replace advice given to you by your health care provider. Make sure you discuss any questions you have with your health care provider. Document Released: 05/22/2005  Document Revised: 10/18/2015 Document Reviewed: 01/01/2015 Elsevier Interactive Patient Education  2018 [employer] Inc[employer]  

## 2018-03-02 ENCOUNTER — Other Ambulatory Visit: Payer: Self-pay | Admitting: Dermatology

## 2018-03-02 DIAGNOSIS — D229 Melanocytic nevi, unspecified: Secondary | ICD-10-CM | POA: Diagnosis not present

## 2018-03-02 DIAGNOSIS — L821 Other seborrheic keratosis: Secondary | ICD-10-CM | POA: Diagnosis not present

## 2018-03-02 DIAGNOSIS — D485 Neoplasm of uncertain behavior of skin: Secondary | ICD-10-CM | POA: Diagnosis not present

## 2018-03-07 ENCOUNTER — Ambulatory Visit: Payer: BLUE CROSS/BLUE SHIELD | Admitting: Physician Assistant

## 2018-03-07 ENCOUNTER — Encounter: Payer: Self-pay | Admitting: Physician Assistant

## 2018-03-07 VITALS — BP 122/70 | HR 75 | Ht 64.0 in | Wt 129.0 lb

## 2018-03-07 DIAGNOSIS — R002 Palpitations: Secondary | ICD-10-CM | POA: Diagnosis not present

## 2018-03-07 DIAGNOSIS — I10 Essential (primary) hypertension: Secondary | ICD-10-CM

## 2018-03-07 DIAGNOSIS — Z83438 Family history of other disorder of lipoprotein metabolism and other lipidemia: Secondary | ICD-10-CM

## 2018-03-07 DIAGNOSIS — Z8349 Family history of other endocrine, nutritional and metabolic diseases: Secondary | ICD-10-CM | POA: Diagnosis not present

## 2018-03-07 NOTE — Progress Notes (Signed)
Cardiology Office Note   Date:  03/07/2018   ID:  Kelly Myers, DOB 02/18/1956, MRN [id number]  PCP:  Leighton Ruff, MD Cardiologist:  Shelva Majestic, MD 07/05/2017 Kelly Ferries, PA-C 02/20/2016  No chief complaint on file.   History of Present Illness: Kelly Myers is a 62 y.o. female with a history of asthma (did not tolerate BB), endometriosis, HTN, hypothyroidism, nl EF w/ grade 2 dd by echo 02/2016  06/2017 office visit, Bystolic added, labs done and LPA elevated at 90, other labs ok  [name] presents for cardiology follow up.  She could not afford the Bystolic, is doing well on the bisoprolol.   Her BP control is good on current therapy.   She is working a little less, still walking dogs for hours a day. She also does yoga.   She tries to eat a low-cholesterol diet.  She never gets chest pain.   No dyspnea on exertion.  No LE edema, no orthopnea or PND.   No palpitations.   Past Medical History:  Diagnosis Date  . Asthma    Asthma cough- meds control  . Atrophic vaginitis   . Bleeds easily ([place])    "have had clotting studies, tests, I'm within normal limits" (02/12/2016)  . Endometriosis   . 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 History of blood transfusion 1984   following childbirth.  . History of radiation therapy 1963   after adenoid surgery x 2, they "grew back" and she was treated w/ XRT to the throat  . Hypertension   . Hypothyroidism   . Osteopenia 03/2016   T score -1.3 FRAX 6.5%/0.4% stable from prior DEXA  . Pneumonia    "as a child" (02/12/2016)  . Thyroid nodule   . Uterine prolapse     Past Surgical History:  Procedure Laterality Date  . ADENOIDECTOMY  1960; 2202  . ANTERIOR AND POSTERIOR VAGINAL REPAIR  2000s  . BLADDER SUSPENSION  2000s  . CESAREAN SECTION  1984  . COLONOSCOPY WITH PROPOFOL N/A 11/04/2015   Procedure: COLONOSCOPY WITH PROPOFOL;  Surgeon: Garlan Fair, MD;  Location: [facility]  ENDOSCOPY;  Service: Endoscopy;  Laterality: N/A;  . TUBAL LIGATION  1984  . VAGINAL HYSTERECTOMY  2000s   w/bladder suspension and A/P repair  . WISDOM TOOTH EXTRACTION  1974    Current Outpatient Medications  Medication Sig Dispense Refill  . bisoprolol (ZEBETA) 5 MG tablet Take 1 tablet (5 mg total) by mouth daily. 30 tablet 3  . Calcium Carbonate-Vitamin D (CALCIUM + D PO) Take 1 tablet by mouth daily.     . Cetirizine HCl (ZYRTEC ALLERGY) 10 MG CAPS Take 10 mg by mouth daily.     Marland Kitchen estradiol (ESTRACE VAGINAL) 0.1 MG/GM vaginal cream Place 1 Applicatorful at bedtime vaginally. (Patient taking differently: Place 1 Applicatorful vaginally 3 (three) times a week. ) 42.5 g 12  . estradiol (ESTRACE) 0.5 MG tablet Take half tablet daily 90 tablet 4  . fluticasone (FLONASE) 50 MCG/ACT nasal spray Place 2 sprays into both nostrils daily.    Marland Kitchen glucosamine-chondroitin 500-400 MG tablet Take 1 tablet by mouth daily.     . irbesartan (AVAPRO) 150 MG tablet Take 1 tablet (150 mg total) by mouth daily. 90 tablet 3  . levothyroxine (SYNTHROID, LEVOTHROID) 100 MCG tablet Take 1 tablet (100 mcg total) by mouth daily before breakfast. 30 tablet 0  . montelukast (SINGULAIR) 10 MG tablet Take 10  mg by mouth at bedtime.      . Probiotic Product (PROBIOTIC DAILY PO) Take 1 capsule by mouth daily.     . diclofenac (VOLTAREN) 75 MG EC tablet Take 1 tablet (75 mg total) by mouth 2 (two) times daily. (Patient not taking: Reported on 03/07/2018) 30 tablet 2  . neomycin-polymyxin b-dexamethasone (MAXITROL) 3.5-10000-0.1 SUSP   0  . VENTOLIN HFA 108 (90 Base) MCG/ACT inhaler   0   No current facility-administered medications for this visit.     Allergies:   Macrodantin [nitrofurantoin] and Neosporin [neomycin-bacitracin zn-polymyx]    Social History:  The patient  reports that she quit smoking about 36 years ago. Her smoking use included cigarettes. She has a 5.00 pack-year smoking history. She has never used  smokeless tobacco. She reports that she drinks about 12.0 standard drinks of alcohol per week. She reports that she does not use drugs.   Family History:  The patient's family history includes Breast cancer in her paternal grandmother; Cancer in her father; Heart attack in her sister; Heart disease in her maternal grandfather and sister; Hypertension in her father, mother, and sister.  She indicated that her mother is deceased. She indicated that her father is alive. She indicated that the status of her sister is unknown. She indicated that her maternal grandmother is deceased. She indicated that her maternal grandfather is deceased. She indicated that her paternal grandmother is deceased. She indicated that her paternal grandfather is deceased.   ROS:  Please see the history of present illness. All other systems are reviewed and negative.    PHYSICAL EXAM: VS:  BP 122/70   Pulse 75   Ht 5\' 4"  (1.626 m)   Wt 129 lb (58.5 kg)   SpO2 99%   BMI 22.14 kg/m  , BMI Body mass index is 22.14 kg/m. GEN: Well nourished, well developed, female in no acute distress HEENT: normal for age  Neck: no JVD, no carotid bruit, no masses Cardiac: RRR, 2/6 murmur, no rubs, or gallops Respiratory:  clear to auscultation bilaterally, normal work of breathing GI: soft, nontender, nondistended, + BS MS: no deformity or atrophy; no edema; distal pulses are 2+ in all 4 extremities  Skin: warm and dry, no rash Neuro:  Strength and sensation are intact Psych: euthymic mood, full affect   EKG:  EKG is not ordered today.  ECHO: 2017 - Left ventricle: The cavity size was normal. There was mild   concentric hypertrophy. Systolic function was normal. The   estimated ejection fraction was in the range of 55% to 60%. Wall   motion was normal; there were no regional wall motion   abnormalities. Features are consistent with a pseudonormal left   ventricular filling pattern, with concomitant abnormal relaxation   and  increased filling pressure (grade 2 diastolic dysfunction).   Recent Labs: 07/06/2017: ALT 24; BUN 17; Creatinine, Ser 0.84; Hemoglobin 14.1; Magnesium 2.2; Platelets 164; Potassium 4.2; Sodium 141; TSH 1.160  CBC    Component Value Date/Time   WBC 4.3 07/06/2017 0831   WBC 5.4 02/14/2016 0223   RBC 4.72 07/06/2017 0831   RBC 3.62 (L) 02/14/2016 0223   HGB 14.1 07/06/2017 0831   HCT 42.5 07/06/2017 0831   PLT 164 07/06/2017 0831   MCV 90 07/06/2017 0831   MCH 29.9 07/06/2017 0831   MCH 29.3 02/14/2016 0223   MCHC 33.2 07/06/2017 0831   MCHC 33.0 02/14/2016 0223   RDW 13.4 07/06/2017 0831   LYMPHSABS 0.6 (L)  02/12/2016 1220   MONOABS 0.8 02/12/2016 1220   EOSABS 0.4 02/12/2016 1220   BASOSABS 0.0 02/12/2016 1220   CMP Latest Ref Rng & Units 07/06/2017 10/08/2016 02/14/2016  Glucose 65 - 99 mg/dL 78 108(H) 110(H)  BUN 8 - 27 mg/dL 17 17 <5(L)  Creatinine 0.57 - 1.00 mg/dL 0.84 0.75 0.57  Sodium 134 - 144 mmol/L 141 137 139  Potassium 3.5 - 5.2 mmol/L 4.2 4.5 4.0  Chloride 96 - 106 mmol/L 102 98 108  CO2 20 - 29 mmol/L 25 25 25   Calcium 8.7 - 10.3 mg/dL 9.4 9.8 9.1  Total Protein 6.0 - 8.5 g/dL 6.6 6.7 5.7(L)  Total Bilirubin 0.0 - 1.2 mg/dL 0.6 0.5 0.8  Alkaline Phos 39 - 117 IU/L 69 59 187(H)  AST 0 - 40 IU/L 26 28 38  ALT 0 - 32 IU/L 24 29 70(H)     Lipid Panel Lab Results  Component Value Date   CHOL 182 07/06/2017   HDL 81 07/06/2017   LDLCALC 86 07/06/2017   TRIG 73 07/06/2017   CHOLHDL 2.2 07/06/2017      Wt Readings from Last 3 Encounters:  03/07/18 129 lb (58.5 kg)  03/01/18 129 lb (58.5 kg)  07/05/17 127 lb 6.4 oz (57.8 kg)     Other studies Reviewed: Additional studies/ records that were reviewed today include: Office notes, hospital records and testing.  ASSESSMENT AND PLAN:  1.  Palpitations, Tachycardia: - Her symptoms are well controlled by low-dose of beta-blocker.  She is tolerating the bisoprolol at this time. - She had some GI issues with  it previously, but is doing okay now.  She will contact us if she again develops problems with it.  2.  Hypertension: Her blood pressure is well controlled on current therapy, no changes  3.  Dyslipidemia: She has an elevated LPa level but her lipid profile is otherwise good.  Her HDL is protective at 74. -Recheck in 1 year   Current medicines are reviewed at length with the patient today.  The patient does not have concerns regarding medicines.  The following changes have been made:  no change  Labs/ tests ordered today include:  Orders Placed This Encounter  Procedures  . Lipoprotein A (LPA)  . Comprehensive Metabolic Panel (CMET)  . Lipid Profile    Disposition:   FU with Shelva Majestic, MD  Signed, Kelly Ferries, PA-C  03/07/2018 3:22 PM    [place] Phone: [phone]; Fax: [phone]

## 2018-03-07 NOTE — Patient Instructions (Addendum)
Medication Instructions:  Your physician recommends that you continue on your current medications as directed. Please refer to the Current Medication list given to you today. If you need a refill on your cardiac medications before your next appointment, please call your pharmacy.   Lab work: Your physician recommends that you return for lab work in: right before next appt with Dr Fortine Callas, LIPID, LPA If you have labs (blood work) drawn today and your tests are completely normal, you will receive your results only by: Marland Kitchen MyChart Message (if you have MyChart) OR . A paper copy in the mail If you have any lab test that is abnormal or we need to change your treatment, we will call you to review the results.  Testing/Procedures: None   Follow-Up: At [facility], you and your health needs are our priority.  As part of our continuing mission to provide you with exceptional heart care, we have created designated Provider Care Teams.  These Care Teams include your primary Cardiologist (physician) and Advanced Practice Providers (APPs -  Physician Assistants and Nurse Practitioners) who all work together to provide you with the care you need, when you need it. You will need a follow up appointment in 12 months.  Please call our office 2 months in advance to schedule this appointment.  You may see Shelva Majestic, MD or one of the following Advanced Practice Providers on your designated Care Team: [place], [place] . Fabian Sharp, PA-C  Any Other Special Instructions Will Be Listed Below (If Applicable).

## 2018-03-08 ENCOUNTER — Ambulatory Visit
Admission: RE | Admit: 2018-03-08 | Discharge: 2018-03-08 | Disposition: A | Discharge status: Home / Self Care | Payer: BLUE CROSS/BLUE SHIELD | Source: Ambulatory Visit | Attending: Family Medicine | Admitting: Family Medicine

## 2018-03-08 DIAGNOSIS — Z1231 Encounter for screening mammogram for malignant neoplasm of breast: Secondary | ICD-10-CM

## 2018-05-17 ENCOUNTER — Ambulatory Visit: Payer: BLUE CROSS/BLUE SHIELD

## 2018-05-17 DIAGNOSIS — Z13820 Encounter for screening for osteoporosis: Secondary | ICD-10-CM

## 2018-05-19 ENCOUNTER — Encounter: Payer: Self-pay | Admitting: Gynecology

## 2018-05-23 ENCOUNTER — Encounter: Payer: Self-pay | Admitting: Podiatry

## 2018-05-23 ENCOUNTER — Ambulatory Visit: Payer: BLUE CROSS/BLUE SHIELD | Admitting: Podiatry

## 2018-05-23 ENCOUNTER — Ambulatory Visit (INDEPENDENT_AMBULATORY_CARE_PROVIDER_SITE_OTHER): Payer: BLUE CROSS/BLUE SHIELD

## 2018-05-23 ENCOUNTER — Other Ambulatory Visit: Payer: Self-pay | Admitting: Podiatry

## 2018-05-23 DIAGNOSIS — M779 Enthesopathy, unspecified: Secondary | ICD-10-CM

## 2018-05-23 DIAGNOSIS — Q828 Other specified congenital malformations of skin: Secondary | ICD-10-CM

## 2018-05-23 DIAGNOSIS — M79672 Pain in left foot: Secondary | ICD-10-CM

## 2018-05-23 DIAGNOSIS — M79671 Pain in right foot: Secondary | ICD-10-CM

## 2018-05-23 MED ORDER — TRIAMCINOLONE ACETONIDE 10 MG/ML IJ SUSP
10.0000 mg | Freq: Once | INTRAMUSCULAR | Status: AC
  Administered 2018-05-23: 10 mg

## 2018-05-29 NOTE — Progress Notes (Signed)
Subjective:   Patient ID: Kelly Myers, female   DOB: 63 y.o.   MRN: [id number]   HPI Patient presents with inflammation around the first MPJ right foot and also is noted to have lesion formation right and a separate area that is painful and makes it hard to walk on.  Patient has good digital perfusion and is well oriented x3   ROS      Objective:  Physical Exam  Neurovascular status found to be intact muscle strength was adequate patient found to have inflammation around the first MPJ right localized in nature with no other digital deformity noted with keratotic lesion sub-right foot     Assessment:  Inflammatory capsulitis of the first MPJ bilateral with mild discomfort of the plantar right foot with porokeratotic type lesion     Plan:  H&P x-ray reviewed and today I did a sterile prep and carefully injected around the first MPJ 3 mg Kenalog 5 mg Xylocaine applied sterile dressing debrided lesion and will be seen back as needed for reevaluation  X-rays indicate small spurs with no indications of advancement of arthritic process of the big toe joint right over left

## 2018-08-29 ENCOUNTER — Other Ambulatory Visit: Payer: Self-pay | Admitting: Cardiovascular Disease

## 2018-09-22 DIAGNOSIS — H1013 Acute atopic conjunctivitis, bilateral: Secondary | ICD-10-CM | POA: Diagnosis not present

## 2018-09-26 ENCOUNTER — Other Ambulatory Visit: Payer: Self-pay | Admitting: Cardiovascular Disease

## 2018-10-05 ENCOUNTER — Other Ambulatory Visit: Payer: Self-pay | Admitting: *Deleted

## 2018-10-05 MED ORDER — IRBESARTAN 150 MG PO TABS: 150.0000 mg | ORAL_TABLET | Freq: Every day | ORAL | 3 refills | Status: DC

## 2018-11-21 ENCOUNTER — Other Ambulatory Visit: Payer: Self-pay | Admitting: Cardiovascular Disease

## 2018-12-29 DIAGNOSIS — J309 Allergic rhinitis, unspecified: Secondary | ICD-10-CM | POA: Diagnosis not present

## 2018-12-29 DIAGNOSIS — G43909 Migraine, unspecified, not intractable, without status migrainosus: Secondary | ICD-10-CM | POA: Diagnosis not present

## 2018-12-29 DIAGNOSIS — I10 Essential (primary) hypertension: Secondary | ICD-10-CM | POA: Diagnosis not present

## 2018-12-29 DIAGNOSIS — E039 Hypothyroidism, unspecified: Secondary | ICD-10-CM | POA: Diagnosis not present

## 2019-01-10 ENCOUNTER — Encounter: Payer: Self-pay | Admitting: Gynecology

## 2019-01-30 ENCOUNTER — Other Ambulatory Visit: Payer: Self-pay | Admitting: Family Medicine

## 2019-01-30 DIAGNOSIS — Z1231 Encounter for screening mammogram for malignant neoplasm of breast: Secondary | ICD-10-CM

## 2019-02-01 DIAGNOSIS — I10 Essential (primary) hypertension: Secondary | ICD-10-CM | POA: Diagnosis not present

## 2019-02-01 DIAGNOSIS — J309 Allergic rhinitis, unspecified: Secondary | ICD-10-CM | POA: Diagnosis not present

## 2019-02-01 DIAGNOSIS — E042 Nontoxic multinodular goiter: Secondary | ICD-10-CM | POA: Diagnosis not present

## 2019-02-01 DIAGNOSIS — D126 Benign neoplasm of colon, unspecified: Secondary | ICD-10-CM | POA: Diagnosis not present

## 2019-02-15 ENCOUNTER — Other Ambulatory Visit: Payer: Self-pay

## 2019-02-15 ENCOUNTER — Encounter: Payer: Self-pay | Admitting: Podiatry

## 2019-02-15 ENCOUNTER — Ambulatory Visit: Payer: BLUE CROSS/BLUE SHIELD | Admitting: Podiatry

## 2019-02-15 DIAGNOSIS — M7662 Achilles tendinitis, left leg: Secondary | ICD-10-CM

## 2019-02-15 DIAGNOSIS — Q828 Other specified congenital malformations of skin: Secondary | ICD-10-CM | POA: Diagnosis not present

## 2019-02-15 NOTE — Patient Instructions (Signed)

## 2019-02-20 DIAGNOSIS — D229 Melanocytic nevi, unspecified: Secondary | ICD-10-CM | POA: Diagnosis not present

## 2019-02-20 DIAGNOSIS — L719 Rosacea, unspecified: Secondary | ICD-10-CM | POA: Diagnosis not present

## 2019-02-20 NOTE — Progress Notes (Signed)
Subjective:   Patient ID: Kelly Myers, female   DOB: 63 y.o.   MRN: [id number]   HPI Patient states that the big toe joint is continuing to improve with mild discomfort and she does get a lesion on the bottom of the foot that is bothersome.  Patient states she seems to be walking with a better heel toe gait and still does note mild discomfort   ROS      Objective:  Physical Exam  Plantar flex metatarsal with porokeratotic lesion and mild inflammation around the first MPJ     Assessment:  H&P reviewed condition debrided lesion discussed hallux limitus and do not recommend treatment except for rigid bottom shoes and stretching exercises.  Reappoint as needed     Plan:  Above treatment plan discussed

## 2019-02-23 DIAGNOSIS — Z8349 Family history of other endocrine, nutritional and metabolic diseases: Secondary | ICD-10-CM | POA: Diagnosis not present

## 2019-02-23 DIAGNOSIS — Z83438 Family history of other disorder of lipoprotein metabolism and other lipidemia: Secondary | ICD-10-CM | POA: Diagnosis not present

## 2019-02-24 LAB — COMPREHENSIVE METABOLIC PANEL
ALT: 24 IU/L (ref 0–32)
AST: 27 IU/L (ref 0–40)
Albumin/Globulin Ratio: 2.2 (ref 1.2–2.2)
Albumin: 4.7 g/dL (ref 3.8–4.8)
Alkaline Phosphatase: 51 IU/L (ref 39–117)
BUN/Creatinine Ratio: 15 (ref 12–28)
BUN: 13 mg/dL (ref 8–27)
Bilirubin Total: 0.5 mg/dL (ref 0.0–1.2)
CO2: 25 mmol/L (ref 20–29)
Calcium: 9.4 mg/dL (ref 8.7–10.3)
Chloride: 101 mmol/L (ref 96–106)
Creatinine, Ser: 0.86 mg/dL (ref 0.57–1.00)
GFR calc Af Amer: 83 mL/min/{1.73_m2} (ref >59)
GFR calc non Af Amer: 72 mL/min/{1.73_m2} (ref >59)
Globulin, Total: 2.1 g/dL (ref 1.5–4.5)
Glucose: 95 mg/dL (ref 65–99)
Potassium: 4.4 mmol/L (ref 3.5–5.2)
Sodium: 138 mmol/L (ref 134–144)
Total Protein: 6.8 g/dL (ref 6.0–8.5)

## 2019-02-24 LAB — LIPOPROTEIN A (LPA): Lipoprotein (a): 77.8 nmol/L — ABNORMAL HIGH (ref <75.0)

## 2019-02-24 LAB — LIPID PANEL
Chol/HDL Ratio: 2.4 ratio (ref 0.0–4.4)
Cholesterol, Total: 186 mg/dL (ref 100–199)
HDL: 79 mg/dL (ref >39)
LDL Chol Calc (NIH): 94 mg/dL (ref 0–99)
Triglycerides: 72 mg/dL (ref 0–149)
VLDL Cholesterol Cal: 13 mg/dL (ref 5–40)

## 2019-02-28 ENCOUNTER — Other Ambulatory Visit: Payer: Self-pay | Admitting: Cardiovascular Disease

## 2019-03-07 ENCOUNTER — Ambulatory Visit: Payer: BLUE CROSS/BLUE SHIELD | Admitting: Cardiovascular Disease

## 2019-03-07 ENCOUNTER — Encounter: Payer: BLUE CROSS/BLUE SHIELD | Admitting: Women's Health

## 2019-03-20 ENCOUNTER — Ambulatory Visit
Admission: RE | Admit: 2019-03-20 | Discharge: 2019-03-20 | Disposition: A | Discharge status: Home / Self Care | Payer: BC Managed Care – PPO | Source: Ambulatory Visit | Attending: Family Medicine | Admitting: Family Medicine

## 2019-03-20 ENCOUNTER — Other Ambulatory Visit: Payer: Self-pay

## 2019-03-20 DIAGNOSIS — Z1231 Encounter for screening mammogram for malignant neoplasm of breast: Secondary | ICD-10-CM | POA: Diagnosis not present

## 2019-04-11 ENCOUNTER — Encounter: Payer: BC Managed Care – PPO | Admitting: Women's Health

## 2019-04-25 ENCOUNTER — Other Ambulatory Visit: Payer: Self-pay

## 2019-04-25 ENCOUNTER — Ambulatory Visit: Payer: BC Managed Care – PPO | Admitting: Cardiovascular Disease

## 2019-04-25 ENCOUNTER — Encounter: Payer: Self-pay | Admitting: Cardiovascular Disease

## 2019-04-25 VITALS — BP 140/72 | HR 66 | Temp 97.2°F | Ht 64.5 in | Wt 133.0 lb

## 2019-04-25 DIAGNOSIS — I10 Essential (primary) hypertension: Secondary | ICD-10-CM | POA: Diagnosis not present

## 2019-04-25 DIAGNOSIS — R002 Palpitations: Secondary | ICD-10-CM | POA: Diagnosis not present

## 2019-04-25 DIAGNOSIS — E039 Hypothyroidism, unspecified: Secondary | ICD-10-CM

## 2019-04-25 DIAGNOSIS — Z8349 Family history of other endocrine, nutritional and metabolic diseases: Secondary | ICD-10-CM

## 2019-04-25 DIAGNOSIS — I5189 Other ill-defined heart diseases: Secondary | ICD-10-CM | POA: Diagnosis not present

## 2019-04-25 DIAGNOSIS — E7841 Elevated Lipoprotein(a): Secondary | ICD-10-CM

## 2019-04-25 NOTE — Progress Notes (Signed)
Cardiology Office Note    Date:  04/25/2019   ID:  KHIANA CAMINO, DOB 1955/09/30, MRN [id number]  PCP:  Leighton Ruff, MD  Cardiologist:  Shelva Majestic, MD   Chief Complaint  Patient presents with  . Follow-up    12 months.    History of Present Illness:  Kelly Myers is a 64 y.o. female who presents for a 22 month follow-up cardiology evaluation.   Ms. Ent has a history of asthma, endometriosis, hypotension, and hypothyroidism.  In November 2017 she presented to the emergency room with generalized fatigue and neck pain.  She is followed by Dr. Forde Dandy for hypothyroidism and is on levothyroxine prior to her admission, she was febrile, and tachycardic.  She had mildly increased d-dimer.  A chest CT was negative for PE.  BMP was minimally increased at 263 and troponin was 0.13.  LFTs were elevated with alkaline phosphatase of 205, ALT 89, AST 51.  I was concerned that she may have had over suppressed thyroid may also have been a contributor to her tachycardia. An echo Doppler study showed an EF of 55-60% with grade 2 diastolic dysfunction.   I felt her mild troponin elevation was demand ischemia rather than ACS.  Her levothyroxine dose was reduced to 100 g.  She developed mild asthma symptoms on the beta blocker was discontinued.    She has a greater than 15 year history of hypertension and was on lisinopril 10 mg daily.  In the past, she had developed hypokalemia on hydrochlorothiazide.  She is on Singulair for her asthma.  She takes Flonase for allergic rhinitis symptoms.    When I saw her, particularly with her asthma history.  I felt she would do better with ARB therapy and discontinued lisinopril and started initially valsartan at 80 mg with the implants increased to 160 mg daily.  An echo Doppler study did show normal systolic function with grade 2 diastolic dysfunction.  She underwent follow-up laboratory and again her TSH was over suppressed at 0.231.  She is followed by Dr.  Tamala Ser because of her thyroid nodules.  He has wanted to keep her TSH in the over suppressed range and as result, he did not make additional adjustment to her thyroid medication.  She was notified that her generic valsartan was from one of the companies that had purchased the tainted valsartan powder from [place].  As result, she was switched to irbesartan, but initially at a very low dose of just 75 mg.  She continues to be on Singulair for asthma, levothyroxine 100 g for her thyroid abnormalities.    I saw her in September 2018 and last saw her in March 2019.  At that time she had felt well but had noticed occasional skipped beats.  She had noticed occasional skipped beats.  She also has noticed some mild blood pressure elevation which seems to at times be labile, but other times entirely normal. She informed me that her sister 12 years ago suffered 2 myocardial infarctions and had an elevated Lpa.  Her father also is the oldest living known person with [name]'s disease at age 35. She denied chest pressure, PND, orthopnea.  During her evaluation, I recommended initiating Bystolic 2.5 mg for 1 week and if blood pressure remained elevated to titrate to 5 mg. I also recommended she undergo lipoprotein (a) test which was done and this was increased at 90.  Since I last saw her, she was evaluated by Rosaria Ferries on March 07, 2018.  Her palpitations improved with Bystolic but ultimately this had been switched to bisoprolol due to cost which she has tolerated well.  She tells me that her husband contacted Covid in November and quarantined in the house.  She and her daughter did not test positive.  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  She continues to be active.  Her daughter and son-in-law moved back to her house from [place] after they lost their jobs in education.  Presently, she feels well.  She  states her blood pressure at home typically is well controlled in the 120s to 130s.  She denies chest pain.  She is unaware of any significant palpitations.  She continues to be on bisoprolol 5 mg daily and irbesartan 150 mg daily for hypertension.  She is on levothyroxine for hypothyroidism.  She has no tachycardia last and has a as needed Ventolin for asthma.  She presents for evaluation.  Past Medical History:  Diagnosis Date  . Asthma    Asthma cough- meds control  . Atrophic vaginitis   . Bleeds easily ([place])    "have had clotting studies, tests, I'm within normal limits" (02/12/2016)  . Endometriosis   . 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 History of blood transfusion 1984   following childbirth.  . History of radiation therapy 1963   after adenoid surgery x 2, they "grew back" and she was treated w/ XRT to the throat  . Hypertension   . Hypothyroidism   . Osteopenia 05/2018   T score -1.4  . Pneumonia    "as a child" (02/12/2016)  . Thyroid nodule   . Uterine prolapse     Past Surgical History:  Procedure Laterality Date  . ADENOIDECTOMY  1960; 5056  . ANTERIOR AND POSTERIOR VAGINAL REPAIR  2000s  . BLADDER SUSPENSION  2000s  . CESAREAN SECTION  1984  . COLONOSCOPY WITH PROPOFOL N/A 11/04/2015   Procedure: COLONOSCOPY WITH PROPOFOL;  Surgeon: Garlan Fair, MD;  Location: [facility];  Service: Endoscopy;  Laterality: N/A;  . TUBAL LIGATION  1984  . VAGINAL HYSTERECTOMY  2000s   w/bladder suspension and A/P repair  . WISDOM TOOTH EXTRACTION  1974    Current Medications: Outpatient Medications Prior to Visit  Medication Sig Dispense Refill  . bisoprolol (ZEBETA) 5 MG tablet Take 1 tablet (5 mg total) by mouth daily. KEEP OV. 90 tablet 0  . Calcium Carbonate-Vitamin D (CALCIUM + D PO) Take 1 tablet by mouth daily.     . Cetirizine HCl (ZYRTEC ALLERGY) 10 MG CAPS Take 10 mg by mouth daily.     Marland Kitchen doxycycline (PERIOSTAT) 20 MG tablet Take 20 mg by  mouth 2 (two) times daily.    Marland Kitchen estradiol (ESTRACE VAGINAL) 0.1 MG/GM vaginal cream Place 1 Applicatorful at bedtime vaginally. (Patient taking differently: Place 1 Applicatorful vaginally 3 (three) times a week. ) 42.5 g 12  . estradiol (ESTRACE) 0.5 MG tablet Take half tablet daily 90 tablet 4  . fluticasone (FLONASE) 50 MCG/ACT nasal spray Place 2 sprays into both nostrils daily.    Marland Kitchen glucosamine-chondroitin 500-400 MG tablet Take 1 tablet by mouth daily.     . irbesartan (AVAPRO) 150 MG tablet Take 1 tablet (150 mg total) by mouth daily. 90 tablet 3  . levothyroxine (SYNTHROID, LEVOTHROID) 100 MCG tablet Take 1 tablet (100 mcg total) by mouth  daily before breakfast. 30 tablet 0  . montelukast (SINGULAIR) 10 MG tablet Take 10 mg by mouth at bedtime.      . Probiotic Product (PROBIOTIC DAILY PO) Take 1 capsule by mouth daily.     . VENTOLIN HFA 108 (90 Base) MCG/ACT inhaler   0   No facility-administered medications prior to visit.     Allergies:   Macrodantin [nitrofurantoin] and Neosporin [neomycin-bacitracin zn-polymyx]   Social History   Socioeconomic History  . Marital status: Married    Spouse name: Not on file  . Number of children: Not on file  . Years of education: Not on file  . Highest education level: Not on file  Occupational History  . Not on file  Tobacco Use  . Smoking status: Former Smoker    Packs/day: 0.50    Years: 10.00    Pack years: 5.00    Types: Cigarettes    Quit date: 1983    Years since quitting: 38.0  . Smokeless tobacco: Never Used  Substance and Sexual Activity  . Alcohol use: Yes    Alcohol/week: 12.0 standard drinks    Types: 5 Standard drinks or equivalent, 7 Glasses of wine per week  . Drug use: No  . Sexual activity: Yes    Birth control/protection: Surgical  Other Topics Concern  . Not on file  Social History Narrative  . Not on file   Social Determinants of Health   Financial Resource Strain:   . Difficulty of Paying Living  Expenses: Not on file  Food Insecurity:   . Worried About Charity fundraiser in the Last Year: Not on file  . Ran Out of Food in the Last Year: Not on file  Transportation Needs:   . Lack of Transportation (Medical): Not on file  . Lack of Transportation (Non-Medical): Not on file  Physical Activity:   . Days of Exercise per Week: Not on file  . Minutes of Exercise per Session: Not on file  Stress:   . Feeling of Stress : Not on file  Social Connections:   . Frequency of Communication with Friends and Family: Not on file  . Frequency of Social Gatherings with Friends and Family: Not on file  . Attends Religious Services: Not on file  . Active Member of Clubs or Organizations: Not on file  . Attends Archivist Meetings: Not on file  . Marital Status: Not on file     Family History:  The patient's family history includes Cancer in her father; Heart attack in her sister; Heart disease in her maternal grandfather and sister; Hypertension in her father, mother, and sister.   ROS General: Negative; No fevers, chills, or night sweats;  HEENT: Negative; No changes in vision or hearing, sinus congestion, difficulty swallowing Pulmonary: Positive for mild asthma Cardiovascular: Negative; No chest pain, presyncope, syncope, palpitations GI: Negative; No nausea, vomiting, diarrhea, or abdominal pain GU: Positive for endometriosis Musculoskeletal: Negative; no myalgias, joint pain, or weakness Hematologic/Oncology: Negative; no easy bruising, bleeding Endocrine: Positive for hypothyroidism Neuro: Negative; no changes in balance, headaches Skin: Negative; No rashes or skin lesions Psychiatric: Negative; No behavioral problems, depression Sleep: Negative; No snoring, daytime sleepiness, hypersomnolence, bruxism, restless legs, hypnogognic hallucinations, no cataplexy Other comprehensive 14 point system review is negative.   PHYSICAL EXAM:   VS:  BP 140/72 (BP Location: Left Arm,  Patient Position: Sitting, Cuff Size: Normal)   Pulse 66   Temp (!) 97.2 F (36.2 C)  Ht 5' 4.5" (1.638 m)   Wt 133 lb (60.3 kg)   BMI 22.48 kg/m     Repeat blood pressure by me was elevated at 154/84  Wt Readings from Last 3 Encounters:  04/25/19 133 lb (60.3 kg)  03/07/18 129 lb (58.5 kg)  03/01/18 129 lb (58.5 kg)    General: Alert, oriented, no distress.  Skin: normal turgor, no rashes, warm and dry HEENT: Normocephalic, atraumatic. Pupils equal round and reactive to light; sclera anicteric; extraocular muscles intact;  Nose without nasal septal hypertrophy Mouth/Parynx benign; Mallinpatti scale 2 Neck: No JVD, no carotid bruits; normal carotid upstroke Lungs: clear to ausculatation and percussion; no wheezing or rales Chest wall: without tenderness to palpitation Heart: PMI not displaced, RRR, s1 s2 normal, 1/6 systolic murmur, no diastolic murmur, no rubs, gallops, thrills, or heaves Abdomen: soft, nontender; no hepatosplenomehaly, BS+; abdominal aorta nontender and not dilated by palpation. Back: no CVA tenderness Pulses 2+ Musculoskeletal: full range of motion, normal strength, no joint deformities Extremities: no clubbing cyanosis or edema, Homan's sign negative  Neurologic: grossly nonfocal; Cranial nerves grossly wnl Psychologic: Normal mood and affect   Studies/Labs Reviewed:   EKG:  EKG is ordered today.  ECG (independently read by me): Normal sinus rhythm at 66 bpm.  Poor R wave progression V1 through V3.  Normal intervals.  No ectopy  March 2019 ECG (independently read by me): normal sinus rhythm with mild sinus arrhythmia.  No significant ST changes.  Normal intervals.  September 2018ECG (independently read by me): Normal sinus rhythm with an isolated PVC.  No signal ST-T changes.  QTc interval 448 ms.  June 2018 ECG (independently read by me): Normal sinus rhythm at 85 bpm with occasional PACs.  Possible left atrial enlargement.  Poor R wave progression  anteriorly.  QTc interval 456.  PR interval 172 ms.  Recent Labs: BMP Latest Ref Rng & Units 02/23/2019 07/06/2017 10/08/2016  Glucose 65 - 99 mg/dL 95 78 108(H)  BUN 8 - 27 mg/dL 13 17 17   Creatinine 0.57 - 1.00 mg/dL 0.86 0.84 0.75  BUN/Creat Ratio 12 - 28 15 20 23   Sodium 134 - 144 mmol/L 138 141 137  Potassium 3.5 - 5.2 mmol/L 4.4 4.2 4.5  Chloride 96 - 106 mmol/L 101 102 98  CO2 20 - 29 mmol/L 25 25 25   Calcium 8.7 - 10.3 mg/dL 9.4 9.4 9.8     Hepatic Function Latest Ref Rng & Units 02/23/2019 07/06/2017 10/08/2016  Total Protein 6.0 - 8.5 g/dL 6.8 6.6 6.7  Albumin 3.8 - 4.8 g/dL 4.7 4.5 4.6  AST 0 - 40 IU/L 27 26 28   ALT 0 - 32 IU/L 24 24 29   Alk Phosphatase 39 - 117 IU/L 51 69 59  Total Bilirubin 0.0 - 1.2 mg/dL 0.5 0.6 0.5    CBC Latest Ref Rng & Units 07/06/2017 10/08/2016 02/14/2016  WBC 3.4 - 10.8 x10E3/uL 4.3 6.1 5.4  Hemoglobin 11.1 - 15.9 g/dL 14.1 13.8 10.6(L)  Hematocrit 34.0 - 46.6 % 42.5 40.3 32.1(L)  Platelets 150 - 379 x10E3/uL 164 164 159   Lab Results  Component Value Date   MCV 90 07/06/2017   MCV 88 10/08/2016   MCV 88.7 02/14/2016   Lab Results  Component Value Date   TSH 1.160 07/06/2017   No results found for: HGBA1C   BNP    Component Value Date/Time   BNP 263.0 (H) 02/12/2016 1220    ProBNP No results found for: PROBNP  Lipid Panel     Component Value Date/Time   CHOL 186 02/23/2019 0835   TRIG 72 02/23/2019 0835   HDL 79 02/23/2019 0835   CHOLHDL 2.4 02/23/2019 0835   LDLCALC 94 02/23/2019 0835     RADIOLOGY: No results found.   Additional studies/ records that were reviewed today include:  Reviewed her hospital admission records from November 2017.  I reviewed her follow-up office visit with Rosaria Ferries.  Laboratory was reviewed as was her echo Doppler data.    ASSESSMENT:    1. Essential hypertension   2. Palpitations   3. Hypothyroidism, unspecified type   4. Diastolic dysfunction   5. Elevated Lp(a)   6.  Family history of [name]'s disease     PLAN:   Ms Shoaff is a 64 year-old female who has a long-standing history of hypertension, as well as a history of hypothyroidism for which she is on levothyroxine replacement followed by Dr. Forde Dandy. Remotely, she has been documented to have thyroid nodules and for this reason, Dr. Forde Dandy has consistently tried to keep her TSH over suppressed.  She also has a history of asthma and endometriosis.  I had seen her during her hospitalization in November 2017 when she was tachycardic and TSH was over suppressed.  Her last  echo Doppler study in November 2017 showed  normal systolic function with grade 2 diastolic dysfunction.  Because of her asthma, I switched her from ACE- inhibition to ARB therapy.  She also had experienced episodic palpitations and when last seen with blood pressure elevation I elected to initiate Bystolic.  She tolerated Bystolic well, but due to cost she ultimately was changed to bisoprolol 5 mg daily which he is continuing to be on.  She also is on irbesartan 150 mg daily as well for blood pressure.  She states her blood pressure at home typically is well controlled.  She clearly does have some blood pressure lability and when rechecked by me her blood pressure had increased to 154/84.  I have recommended close monitoring of her blood pressure at home.  If her systolic blood pressure tends to run in the upper 130s to 140s I have recommended further titration of bisoprolol to 7.5 mg daily.  She continues to be on levothyroxine 100 mcg for her thyroid abnormality.  She had recent laboratory in November prior to her planned office visit.  Unfortunately since her husband got Covid she had to quarantine herself and as result canceled the office visit.  However lipid studies at that time showed total cholesterol 186, triglycerides 72, HDL 79, LDL 94.  LP(a) was rechecked which was 77.8, improved from 90.  On exam she is not having any wheezing.  There is no  suggestion of aortic stenosis which has been implicated with increased LP(a) levels. She admits to some weight gain with her daughter and son-in-law living back with her and her daughter has been doing significant cooking.  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  She will follow-up with Dr. Forde Dandy and her primary physician Dr. Leighton Ruff.  As long as she is stable I will see her in 1 year for reevaluation.   Spent: 25 minutes Medication Adjustments/Labs and Tests Ordered: Current medicines are reviewed at length with the patient today.  Concerns regarding medicines are outlined above.  Medication changes, Labs and Tests ordered  today are listed in the Patient Instructions below. Patient Instructions  Medication Instructions:  Your physician recommends that you continue on your current medications as directed. Please refer to the Current Medication list given to you today.  If you need a refill on your cardiac medications before your next appointment, please call your pharmacy.   Lab work: NONE  Testing/Procedures: NONE  Follow-Up: At [facility], you and your health needs are our priority.  As part of our continuing mission to provide you with exceptional heart care, we have created designated Provider Care Teams.  These Care Teams include your primary Cardiologist (physician) and Advanced Practice Providers (APPs -  Physician Assistants and Nurse Practitioners) who all work together to provide you with the care you need, when you need it. You may see Shelva Majestic, MD or one of the following Advanced Practice Providers on your designated Care Team:    Almyra Deforest, PA-C  Fabian Sharp, [place] or   Roby Lofts, [place]  Your physician wants you to follow-up in: 1 year with Dr. Claiborne Billings.   Any Other Special Instructions Will Be Listed Below (If  Applicable). Monitor your Blood Pressure at home. If you notice that your systoli is staying in the 130s-140s,take a pill and 1/2 of Bisoprolol.           Signed, Shelva Majestic, MD, [facility] 04/25/2019 5:56 PM    [facility] [address], [place], [place], [place]  [address] Phone: [phone]

## 2019-04-25 NOTE — Patient Instructions (Signed)
Medication Instructions:  Your physician recommends that you continue on your current medications as directed. Please refer to the Current Medication list given to you today.  If you need a refill on your cardiac medications before your next appointment, please call your pharmacy.   Lab work: NONE  Testing/Procedures: NONE  Follow-Up: At [facility], you and your health needs are our priority.  As part of our continuing mission to provide you with exceptional heart care, we have created designated Provider Care Teams.  These Care Teams include your primary Cardiologist (physician) and Advanced Practice Providers (APPs -  Physician Assistants and Nurse Practitioners) who all work together to provide you with the care you need, when you need it. You may see Shelva Majestic, MD or one of the following Advanced Practice Providers on your designated Care Team:    Almyra Deforest, PA-C  Fabian Sharp, [place] or   Roby Lofts, [place]  Your physician wants you to follow-up in: 1 year with Dr. Claiborne Billings.   Any Other Special Instructions Will Be Listed Below (If Applicable). Monitor your Blood Pressure at home. If you notice that your systoli is staying in the 130s-140s,take a pill and 1/2 of Bisoprolol.

## 2019-05-15 ENCOUNTER — Other Ambulatory Visit: Payer: Self-pay

## 2019-05-16 ENCOUNTER — Encounter: Payer: Self-pay | Admitting: Women's Health

## 2019-05-16 ENCOUNTER — Telehealth: Payer: Self-pay

## 2019-05-16 ENCOUNTER — Ambulatory Visit (INDEPENDENT_AMBULATORY_CARE_PROVIDER_SITE_OTHER): Payer: BC Managed Care – PPO | Admitting: Women's Health

## 2019-05-16 VITALS — BP 124/80 | Ht 64.0 in | Wt 133.0 lb

## 2019-05-16 DIAGNOSIS — Z01419 Encounter for gynecological examination (general) (routine) without abnormal findings: Secondary | ICD-10-CM

## 2019-05-16 MED ORDER — ESTRADIOL 0.5 MG PO TABS: ORAL_TABLET | ORAL | 4 refills | Status: DC

## 2019-05-16 MED ORDER — ESTRADIOL 0.1 MG/GM VA CREA: 1.0000 | TOPICAL_CREAM | VAGINAL | 12 refills | Status: DC

## 2019-05-16 NOTE — Progress Notes (Signed)
Kelly Myers 1955/05/01 [id number]    History:    Presents for annual exam.  2008 TVH with A&P repair on vaginal estrogen and estradiol 0.25 daily.  Normal Pap and mammogram history.  10/2015 benign colon polyp 5-year follow-up.  Has had Shingrix.  Primary care manages hypertension, hypothyroidism, asthma.  05/2018 T score, -1.4 at spine, bilateral hip average -0.4. Marland Kitchen  Past medical history, past surgical history, family history and social history were all reviewed and documented in the EPIC chart.  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  Daughter and husband both college professors laid off and are currently living with her.   ROS:  A ROS was performed and pertinent positives and negatives are included.  Exam:  Vitals:   05/16/19 1400  BP: 124/80  Weight: 133 lb (60.3 kg)  Height: 5\' 4"  (1.626 m)   Body mass index is 22.83 kg/m.   General appearance:  Normal Thyroid:  Symmetrical, normal in size, without palpable masses or nodularity. Respiratory  Auscultation:  Clear without wheezing or rhonchi Cardiovascular  Auscultation:  Regular rate, without rubs, murmurs or gallops  Edema/varicosities:  Not grossly evident Abdominal  Soft,nontender, without masses, guarding or rebound.  Liver/spleen:  No organomegaly noted  Hernia:  None appreciated  Skin  Inspection:  Grossly normal   Breasts: Examined lying and sitting.     Right: Without masses, retractions, discharge or axillary adenopathy.     Left: Without masses, retractions, discharge or axillary adenopathy. Gentitourinary   Inguinal/mons:  Normal without inguinal adenopathy  External genitalia:  Normal  BUS/Urethra/Skene's glands:  Normal  Vagina:  Normal  Cervix: And uterus absent   Adnexa/parametria:     Rt: Without masses or tenderness.   Lt: Without masses or tenderness.  Anus and perineum: Normal  Digital rectal exam: Normal sphincter tone without palpated masses or  tenderness  Assessment/Plan:  64 y.o. MWF G2 P2 for annual exam with no complaints.  2008 TVH with A&P repair on vaginal estrogen 3 times weekly and estradiol 0.25 daily Hypertension, hypothyroidism, asthma-primary care manages labs and meds Osteopenia without elevated FRAX 2017 benign colon polyp 5-year follow-up  Plan: Estrogen risks of blood clots, strokes and breast cancer reviewed currently on estradiol 0.25 daily with occasional hot flashes.  Will try to stop po estrogen this year.  Vaginal estrogen 0000000 1 applicator 3 times weekly prescription refill given, reviewed minimal systemic affect but states has help with vaginal dryness/burning sensation.  SBEs, continue annual 3D screening mammogram, calcium rich foods, vitamin D 2000 IUs daily.  Aware of importance of continued weightbearing and regular exercise, does yoga, home safety and fall prevention discussed.  Pap screening guidelines reviewed.     Huel Cote [facility], 2:08 PM 05/16/2019

## 2019-05-16 NOTE — Telephone Encounter (Signed)
Pharmacy sent note regarding Estrace Cream Rx: " We cannot make 0.1mg /gram because it is commercially available. May we continue 0.02% estrogen vaginal cream as she has been using? Thank you."

## 2019-05-16 NOTE — Patient Instructions (Signed)
It was good to see you today Vitamin D 2000 IUs daily Health Maintenance for Postmenopausal Women Menopause is a normal process in which your ability to get pregnant comes to an end. This process happens slowly over many months or years, usually between the ages of 28 and 48. Menopause is complete when you have missed your menstrual periods for 12 months. It is important to talk with your health care provider about some of the most common conditions that affect women after menopause (postmenopausal women). These include heart disease, cancer, and bone loss (osteoporosis). Adopting a healthy lifestyle and getting preventive care can help to promote your health and wellness. The actions you take can also lower your chances of developing some of these common conditions. What should I know about menopause? During menopause, you may get a number of symptoms, such as:  Hot flashes. These can be moderate or severe.  Night sweats.  Decrease in sex drive.  Mood swings.  Headaches.  Tiredness.  Irritability.  Memory problems.  Insomnia. Choosing to treat or not to treat these symptoms is a decision that you make with your health care provider. Do I need hormone replacement therapy?  Hormone replacement therapy is effective in treating symptoms that are caused by menopause, such as hot flashes and night sweats.  Hormone replacement carries certain risks, especially as you become older. If you are thinking about using estrogen or estrogen with progestin, discuss the benefits and risks with your health care provider. What is my risk for heart disease and stroke? The risk of heart disease, heart attack, and stroke increases as you age. One of the causes may be a change in the body's hormones during menopause. This can affect how your body uses dietary fats, triglycerides, and cholesterol. Heart attack and stroke are medical emergencies. There are many things that you can do to help prevent heart  disease and stroke. Watch your blood pressure  High blood pressure causes heart disease and increases the risk of stroke. This is more likely to develop in people who have high blood pressure readings, are of African descent, or are overweight.  Have your blood pressure checked: ? Every 3-5 years if you are 31-63 years of age. ? Every year if you are 57 years old or older. Eat a healthy diet   Eat a diet that includes plenty of vegetables, fruits, low-fat dairy products, and lean protein.  Do not eat a lot of foods that are high in solid fats, added sugars, or sodium. Get regular exercise Get regular exercise. This is one of the most important things you can do for your health. Most adults should:  Try to exercise for at least 150 minutes each week. The exercise should increase your heart rate and make you sweat (moderate-intensity exercise).  Try to do strengthening exercises at least twice each week. Do these in addition to the moderate-intensity exercise.  Spend less time sitting. Even light physical activity can be beneficial. Other tips  Work with your health care provider to achieve or maintain a healthy weight.  Do not use any products that contain nicotine or tobacco, such as cigarettes, e-cigarettes, and chewing tobacco. If you need help quitting, ask your health care provider.  Know your numbers. Ask your health care provider to check your cholesterol and your blood sugar (glucose). Continue to have your blood tested as directed by your health care provider. Do I need screening for cancer? Depending on your health history and family history, you may  need to have cancer screening at different stages of your life. This may include screening for:  Breast cancer.  Cervical cancer.  Lung cancer.  Colorectal cancer. What is my risk for osteoporosis? After menopause, you may be at increased risk for osteoporosis. Osteoporosis is a condition in which bone destruction happens  more quickly than new bone creation. To help prevent osteoporosis or the bone fractures that can happen because of osteoporosis, you may take the following actions:  If you are 34-60 years old, get at least 1,000 mg of calcium and at least 600 mg of vitamin D per day.  If you are older than age 62 but younger than age 66, get at least 1,200 mg of calcium and at least 600 mg of vitamin D per day.  If you are older than age 17, get at least 1,200 mg of calcium and at least 800 mg of vitamin D per day. Smoking and drinking excessive alcohol increase the risk of osteoporosis. Eat foods that are rich in calcium and vitamin D, and do weight-bearing exercises several times each week as directed by your health care provider. How does menopause affect my mental health? Depression may occur at any age, but it is more common as you become older. Common symptoms of depression include:  Low or sad mood.  Changes in sleep patterns.  Changes in appetite or eating patterns.  Feeling an overall lack of motivation or enjoyment of activities that you previously enjoyed.  Frequent crying spells. Talk with your health care provider if you think that you are experiencing depression. General instructions See your health care provider for regular wellness exams and vaccines. This may include:  Scheduling regular health, dental, and eye exams.  Getting and maintaining your vaccines. These include: ? Influenza vaccine. Get this vaccine each year before the flu season begins. ? Pneumonia vaccine. ? Shingles vaccine. ? Tetanus, diphtheria, and pertussis (Tdap) booster vaccine. Your health care provider may also recommend other immunizations. Tell your health care provider if you have ever been abused or do not feel safe at home. Summary  Menopause is a normal process in which your ability to get pregnant comes to an end.  This condition causes hot flashes, night sweats, decreased interest in sex, mood  swings, headaches, or lack of sleep.  Treatment for this condition may include hormone replacement therapy.  Take actions to keep yourself healthy, including exercising regularly, eating a healthy diet, watching your weight, and checking your blood pressure and blood sugar levels.  Get screened for cancer and depression. Make sure that you are up to date with all your vaccines. This information is not intended to replace advice given to you by your health care provider. Make sure you discuss any questions you have with your health care provider. Document Revised: 03/23/2018 Document Reviewed: 03/23/2018 Elsevier Patient Education  2020 [employer].

## 2019-05-17 MED ORDER — NONFORMULARY OR COMPOUNDED ITEM: 11 refills | Status: DC

## 2019-05-17 NOTE — Telephone Encounter (Signed)
Juliann Pulse, I just reviewed what was in the chart sorry for the confusion.  Please call in the correct thing-I appreciate you!

## 2019-05-17 NOTE — Telephone Encounter (Signed)
Spoke with Misty at [facility] and gave her Rx for compound Estradiol cream.

## 2019-05-29 ENCOUNTER — Other Ambulatory Visit: Payer: Self-pay | Admitting: Cardiovascular Disease

## 2019-08-07 ENCOUNTER — Other Ambulatory Visit: Payer: Self-pay | Admitting: Cardiovascular Disease

## 2019-11-16 ENCOUNTER — Other Ambulatory Visit: Payer: Self-pay | Admitting: Cardiovascular Disease

## 2020-02-12 ENCOUNTER — Telehealth: Payer: Self-pay | Admitting: Cardiovascular Disease

## 2020-02-12 ENCOUNTER — Other Ambulatory Visit: Payer: Self-pay | Admitting: Cardiovascular Disease

## 2020-02-12 DIAGNOSIS — Z79899 Other long term (current) drug therapy: Secondary | ICD-10-CM

## 2020-02-12 NOTE — Telephone Encounter (Signed)
OK to have fasting labs priot to appointment

## 2020-02-12 NOTE — Telephone Encounter (Signed)
Will forward to Dr. Claiborne Billings.

## 2020-02-12 NOTE — Telephone Encounter (Signed)
Patient sent an appointment request through mychart. She would like to know if she will need lab work and if it will need to be done in before or after her appointment 06/06/2020.

## 2020-02-13 NOTE — Telephone Encounter (Signed)
Spoke with pt to confirm that blood work has been ordered per Dr. Claiborne Billings. Pt understands to come fasting.

## 2020-02-19 ENCOUNTER — Other Ambulatory Visit: Payer: Self-pay | Admitting: Family Medicine

## 2020-02-19 DIAGNOSIS — Z0000 Encounter for general adult medical examination without abnormal findings: Secondary | ICD-10-CM

## 2020-04-01 ENCOUNTER — Other Ambulatory Visit: Payer: Self-pay

## 2020-04-01 ENCOUNTER — Ambulatory Visit
Admission: RE | Admit: 2020-04-01 | Discharge: 2020-04-01 | Disposition: A | Discharge status: Home / Self Care | Payer: BC Managed Care – PPO | Source: Ambulatory Visit | Attending: Family Medicine | Admitting: Family Medicine

## 2020-04-01 DIAGNOSIS — Z0000 Encounter for general adult medical examination without abnormal findings: Secondary | ICD-10-CM

## 2020-04-03 ENCOUNTER — Encounter: Payer: Self-pay | Admitting: Podiatry

## 2020-04-03 ENCOUNTER — Ambulatory Visit: Payer: BC Managed Care – PPO | Admitting: Podiatry

## 2020-04-03 ENCOUNTER — Other Ambulatory Visit: Payer: Self-pay

## 2020-04-03 DIAGNOSIS — M779 Enthesopathy, unspecified: Secondary | ICD-10-CM

## 2020-04-03 DIAGNOSIS — M7751 Other enthesopathy of right foot: Secondary | ICD-10-CM | POA: Diagnosis not present

## 2020-04-03 MED ORDER — TRIAMCINOLONE ACETONIDE 10 MG/ML IJ SUSP
10.0000 mg | Freq: Once | INTRAMUSCULAR | Status: AC
  Administered 2020-04-03: 10 mg

## 2020-04-03 NOTE — Progress Notes (Signed)
Subjective:   Patient ID: Kelly Myers, female   DOB: 64 y.o.   MRN: [id number]   HPI Patient presents with pain and fluid on the outside of the right foot that has been sore for the last couple months and she is trying to take care of herself   ROS      Objective:  Physical Exam  Neurovascular status intact with inflammation around the fifth MPJ right with small porokeratotic lesion     Assessment:  Inflammatory capsulitis fifth MPJ right     Plan:  Sterile prep injected around the capsule 3 mg Dexasone Kenalog 5 mg Xylocaine debrided the lesion reappoint as needed

## 2020-05-02 DIAGNOSIS — Z79899 Other long term (current) drug therapy: Secondary | ICD-10-CM | POA: Diagnosis not present

## 2020-05-03 LAB — CBC
Hematocrit: 41.3 % (ref 34.0–46.6)
Hemoglobin: 14.1 g/dL (ref 11.1–15.9)
MCH: 30.8 pg (ref 26.6–33.0)
MCHC: 34.1 g/dL (ref 31.5–35.7)
MCV: 90 fL (ref 79–97)
Platelets: 162 10*3/uL (ref 150–450)
RBC: 4.58 x10E6/uL (ref 3.77–5.28)
RDW: 12.6 % (ref 11.7–15.4)
WBC: 5 10*3/uL (ref 3.4–10.8)

## 2020-05-03 LAB — COMPREHENSIVE METABOLIC PANEL
ALT: 29 IU/L (ref 0–32)
AST: 28 IU/L (ref 0–40)
Albumin/Globulin Ratio: 2 (ref 1.2–2.2)
Albumin: 4.7 g/dL (ref 3.8–4.8)
Alkaline Phosphatase: 56 IU/L (ref 44–121)
BUN/Creatinine Ratio: 15 (ref 12–28)
BUN: 12 mg/dL (ref 8–27)
Bilirubin Total: 0.7 mg/dL (ref 0.0–1.2)
CO2: 27 mmol/L (ref 20–29)
Calcium: 10 mg/dL (ref 8.7–10.3)
Chloride: 100 mmol/L (ref 96–106)
Creatinine, Ser: 0.79 mg/dL (ref 0.57–1.00)
GFR calc Af Amer: 91 mL/min/{1.73_m2} (ref >59)
GFR calc non Af Amer: 79 mL/min/{1.73_m2} (ref >59)
Globulin, Total: 2.3 g/dL (ref 1.5–4.5)
Glucose: 88 mg/dL (ref 65–99)
Potassium: 4.9 mmol/L (ref 3.5–5.2)
Sodium: 139 mmol/L (ref 134–144)
Total Protein: 7 g/dL (ref 6.0–8.5)

## 2020-05-03 LAB — LIPID PANEL
Chol/HDL Ratio: 2.4 ratio (ref 0.0–4.4)
Cholesterol, Total: 183 mg/dL (ref 100–199)
HDL: 76 mg/dL (ref >39)
LDL Chol Calc (NIH): 94 mg/dL (ref 0–99)
Triglycerides: 69 mg/dL (ref 0–149)
VLDL Cholesterol Cal: 13 mg/dL (ref 5–40)

## 2020-05-16 ENCOUNTER — Other Ambulatory Visit: Payer: Self-pay | Admitting: Cardiovascular Disease

## 2020-05-20 ENCOUNTER — Encounter: Payer: Self-pay | Admitting: Nurse Practitioner

## 2020-05-27 ENCOUNTER — Other Ambulatory Visit: Payer: Self-pay

## 2020-05-27 ENCOUNTER — Encounter: Payer: Self-pay | Admitting: Nurse Practitioner

## 2020-05-27 ENCOUNTER — Ambulatory Visit: Payer: Medicare Other | Admitting: Nurse Practitioner

## 2020-05-27 VITALS — BP 122/78 | Ht 64.0 in | Wt 131.0 lb

## 2020-05-27 DIAGNOSIS — Z90710 Acquired absence of both cervix and uterus: Secondary | ICD-10-CM

## 2020-05-27 DIAGNOSIS — Z01419 Encounter for gynecological examination (general) (routine) without abnormal findings: Secondary | ICD-10-CM

## 2020-05-27 DIAGNOSIS — M8589 Other specified disorders of bone density and structure, multiple sites: Secondary | ICD-10-CM

## 2020-05-27 DIAGNOSIS — N951 Menopausal and female climacteric states: Secondary | ICD-10-CM

## 2020-05-27 DIAGNOSIS — Z780 Asymptomatic menopausal state: Secondary | ICD-10-CM

## 2020-05-27 DIAGNOSIS — Z79890 Hormone replacement therapy (postmenopausal): Secondary | ICD-10-CM

## 2020-05-27 MED ORDER — ESTRADIOL 0.5 MG PO TABS: ORAL_TABLET | ORAL | 4 refills | Status: DC

## 2020-05-27 MED ORDER — NONFORMULARY OR COMPOUNDED ITEM
11 refills | Status: DC
Start: 2020-05-27 — End: 2021-05-28

## 2020-05-27 NOTE — Progress Notes (Signed)
   Kelly Myers [birth year] [id number]   History:  65 y.o. G2P1102 presents for annual exam without GYN complaints. 2008 TVH with A & P repair on ERT and vaginal estrogen. She has decreased Estradiol to 0.25 mg daily. Normal pap and mammogram history. History of HTN, hypothyroidism, osteopenia.   Gynecologic History No LMP recorded. Patient has had a hysterectomy.   Contraception/Family planning: status post hysterectomy  Health Maintenance Last Pap: No longer screening per guidelines Last mammogram: 03/2020. Results were: normal Last colonoscopy: 2017 Last Dexa: 05/17/2018. Results were: t-score -1.4  Past medical history, past surgical history, family history and social history were all reviewed and documented in the EPIC chart.  ROS:  A ROS was performed and pertinent positives and negatives are included.  Exam:  Vitals:   05/27/20 1000  BP: 122/78  Weight: 131 lb (59.4 kg)  Height: 5\' 4"  (1.626 m)   Body mass index is 22.49 kg/m.  General appearance:  Normal Thyroid:  Symmetrical, normal in size, without palpable masses or nodularity. Respiratory  Auscultation:  Clear without wheezing or rhonchi Cardiovascular  Auscultation:  Regular rate, without rubs, murmurs or gallops  Edema/varicosities:  Not grossly evident Abdominal  Soft,nontender, without masses, guarding or rebound.  Liver/spleen:  No organomegaly noted  Hernia:  None appreciated  Skin  Inspection:  Grossly normal   Breasts: Examined lying and sitting.   Right: Without masses, retractions, discharge or axillary adenopathy.   Left: Without masses, retractions, discharge or axillary adenopathy. Gentitourinary   Inguinal/mons:  Normal without inguinal adenopathy  External genitalia:  Normal  BUS/Urethra/Skene's glands:  Normal  Vagina:  Atrophic changes  Cervix:  Absent  Uterus:  Absent  Adnexa/parametria:     Rt: Without masses or tenderness.   Lt: Without masses or tenderness.  Anus and  perineum: Normal  Digital rectal exam: Normal sphincter tone without palpated masses or tenderness  Assessment/Plan:  65 y.o. [id number] for annual exam.   Well female exam with routine gynecological exam - Education provided on SBEs, importance of preventative screenings, current guidelines, high calcium diet, regular exercise, and multivitamin daily. Labs with PCP.   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 Plan: DG Bone Density.  05/2018 T score -1.4.  Continue vitamin D supplement daily and regular exercise.  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   Hormone replacement therapy (HRT) - Plan: estradiol (ESTRACE) 0.5 MG tablet. Taking 1/2 tablet (0.25 mg) daily. Discussed weaning if tolerated. She is aware of the risk for blood clots, heart attack, stroke, and breast cancer.  She would like to continue.  Refill x1 year provided.  Menopausal vaginal dryness - Plan: Estradiol vaginal cream 0.02% three times weekly.  She is doing well with this and would like to continue.  Refill x1 year provided.  Postmenopause - Plan: DG Bone Density  Screening for cervical cancer - Normal Pap history.  No longer screening per guidelines.  Screening for breast cancer - Normal mammogram history.  Continue annual screenings.  Normal breast exam today.  Screening for colon cancer -2017 colonoscopy. Will repeat at GI's recommended interval.   Follow-up in 1 year for annual.   Tamela Gammon [facility], 10:15 AM 05/27/2020

## 2020-05-27 NOTE — Patient Instructions (Signed)

## 2020-06-06 ENCOUNTER — Ambulatory Visit: Payer: BC Managed Care – PPO | Admitting: Cardiovascular Disease

## 2020-06-11 ENCOUNTER — Ambulatory Visit: Payer: BC Managed Care – PPO | Admitting: Cardiovascular Disease

## 2020-07-03 ENCOUNTER — Ambulatory Visit: Payer: Medicare Other | Admitting: Dermatology

## 2020-07-03 ENCOUNTER — Encounter: Payer: Self-pay | Admitting: Dermatology

## 2020-07-03 ENCOUNTER — Other Ambulatory Visit: Payer: Self-pay

## 2020-07-03 DIAGNOSIS — L820 Inflamed seborrheic keratosis: Secondary | ICD-10-CM | POA: Diagnosis not present

## 2020-07-03 DIAGNOSIS — D485 Neoplasm of uncertain behavior of skin: Secondary | ICD-10-CM

## 2020-07-03 DIAGNOSIS — L821 Other seborrheic keratosis: Secondary | ICD-10-CM | POA: Diagnosis not present

## 2020-07-03 DIAGNOSIS — L719 Rosacea, unspecified: Secondary | ICD-10-CM

## 2020-07-03 DIAGNOSIS — Z1283 Encounter for screening for malignant neoplasm of skin: Secondary | ICD-10-CM

## 2020-07-03 MED ORDER — IVERMECTIN 1 % EX CREA: 1.0000 "application " | TOPICAL_CREAM | Freq: Every day | CUTANEOUS | 2 refills | Status: DC

## 2020-07-04 DIAGNOSIS — Z20822 Contact with and (suspected) exposure to covid-19: Secondary | ICD-10-CM | POA: Diagnosis not present

## 2020-07-08 ENCOUNTER — Telehealth: Payer: Self-pay | Admitting: Dermatology

## 2020-07-08 NOTE — Telephone Encounter (Signed)
Prior Authorization done via cover my meds for Ivermectin 1 % Cream   Kelly Myers (Key: [id number])  [facility] is reviewing your PA request. Typically an electronic response will be received within 24-72 hours. To check for an update later, open this request from your dashboard.  You may close this dialog and return to your dashboard to perform other tasks.

## 2020-07-08 NOTE — Telephone Encounter (Signed)
Patient calling to inform our office that Ivermectin cream needs a PA before pharmacy can fill it. I did not see that the pharmacy has sent a denial yet. The pharmacy is [facility].

## 2020-07-08 NOTE — Telephone Encounter (Signed)
Anaissa Arrants (Key: [id number])  This request has received a Favorable outcome.  Please note any additional information provided by [facility] at the bottom of your screen.

## 2020-07-14 ENCOUNTER — Encounter: Payer: Self-pay | Admitting: Dermatology

## 2020-07-15 NOTE — Progress Notes (Signed)
   Follow-Up Visit   Subjective  Kelly Myers is a 65 y.o. female who presents for the following: Annual Exam (No new concerns).  Possible new spots on upper back, discussed rosacea, general skin check Location:  Duration:  Quality:  Associated Signs/Symptoms: Modifying Factors:  Severity:  Timing: Context:   Objective  Well appearing patient in no apparent distress; mood and affect are within normal limits. Objective  Chest - Medial [facility]): Full body skin check. No atypical moles or melanoma.  Patient has blocked sweat glands on the bottom of feet that she has shaved off.   Objective  Left Thigh - Anterior, Left Upper Back, Right Shoulder - Posterior, Right Thigh - Anterior: Multiple noninflamed brown textured flattopped 3 to 9 mm papules  Objective  Right Upper Back: 6 mm pink slightly inflamed textured papule: I-SK vs superficial carcinoma.     Objective  Left Upper Back: 9 mm pink inflamed flattopped textured papule: I SK versus superficial carcinoma     Objective  Left Buccal Cheek, Right Malar Cheek: Mildly edematous patchy erythema without pustules compatible with rosacea exacerbated by Covid mask.    A full examination was performed including scalp, head, eyes, ears, nose, lips, neck, chest, axillae, abdomen, back, buttocks, bilateral upper extremities, bilateral lower extremities, hands, feet, fingers, toes, fingernails, and toenails. All findings within normal limits unless otherwise noted below.  Areas beneath undergarments not fully examined.   Assessment & Plan    Screening exam for skin cancer Chest - Medial [facility])  Yearly skin check  Seborrheic keratosis (4) Right Shoulder - Posterior; Left Thigh - Anterior; Right Thigh - Anterior; Left Upper Back  No intervention currently indicated.  Neoplasm of uncertain behavior of skin (2) Right Upper Back  Skin / nail biopsy Type of biopsy: tangential   Informed consent: discussed and  consent obtained   Timeout: patient name, date of birth, surgical site, and procedure verified   Procedure prep:  Patient was prepped and draped in usual sterile fashion (Non sterile) Prep type:  Chlorhexidine Anesthesia: the lesion was anesthetized in a standard fashion   Anesthetic:  1% lidocaine w/ epinephrine 1-100,000 local infiltration Instrument used: flexible razor blade   Outcome: patient tolerated procedure well   Post-procedure details: wound care instructions given    Specimen 1 - Surgical pathology Differential Diagnosis: r/o seb k Check Margins: No  Left Upper Back  Skin / nail biopsy Type of biopsy: tangential   Informed consent: discussed and consent obtained   Timeout: patient name, date of birth, surgical site, and procedure verified   Procedure prep:  Patient was prepped and draped in usual sterile fashion (Non sterile) Prep type:  Chlorhexidine Anesthesia: the lesion was anesthetized in a standard fashion   Anesthetic:  1% lidocaine w/ epinephrine 1-100,000 local infiltration Instrument used: flexible razor blade   Hemostasis achieved with: electrodesiccation   Outcome: patient tolerated procedure well   Post-procedure details: wound care instructions given    Specimen 2 - Surgical pathology Differential Diagnosis: r/o seb k Cautery  only Check Margins: No  Rosacea (2) Right Malar Cheek; Left Buccal Cheek  Topical ivermectin daily or less often if doing well.  Ivermectin 1 % CREA - Left Buccal Cheek, Right Malar Cheek      I, [name], MD, have reviewed all documentation for this visit.  The documentation on 07/15/20 for the exam, diagnosis, procedures, and orders are all accurate and complete.

## 2020-07-23 ENCOUNTER — Other Ambulatory Visit: Payer: Self-pay | Admitting: Nurse Practitioner

## 2020-07-23 ENCOUNTER — Ambulatory Visit (INDEPENDENT_AMBULATORY_CARE_PROVIDER_SITE_OTHER): Payer: Medicare Other

## 2020-07-23 ENCOUNTER — Encounter: Payer: Self-pay | Admitting: Cardiovascular Disease

## 2020-07-23 ENCOUNTER — Ambulatory Visit: Payer: Medicare Other | Admitting: Cardiovascular Disease

## 2020-07-23 ENCOUNTER — Other Ambulatory Visit: Payer: Self-pay

## 2020-07-23 VITALS — BP 152/82 | HR 75 | Ht 64.0 in | Wt 130.0 lb

## 2020-07-23 DIAGNOSIS — M8588 Other specified disorders of bone density and structure, other site: Secondary | ICD-10-CM

## 2020-07-23 DIAGNOSIS — E039 Hypothyroidism, unspecified: Secondary | ICD-10-CM

## 2020-07-23 DIAGNOSIS — I10 Essential (primary) hypertension: Secondary | ICD-10-CM

## 2020-07-23 DIAGNOSIS — E7841 Elevated Lipoprotein(a): Secondary | ICD-10-CM | POA: Diagnosis not present

## 2020-07-23 DIAGNOSIS — Z780 Asymptomatic menopausal state: Secondary | ICD-10-CM

## 2020-07-23 DIAGNOSIS — R002 Palpitations: Secondary | ICD-10-CM | POA: Diagnosis not present

## 2020-07-23 DIAGNOSIS — Z8349 Family history of other endocrine, nutritional and metabolic diseases: Secondary | ICD-10-CM

## 2020-07-23 DIAGNOSIS — M8589 Other specified disorders of bone density and structure, multiple sites: Secondary | ICD-10-CM

## 2020-07-23 NOTE — Progress Notes (Signed)
Cardiology Office Note    Date:  07/24/2020   ID:  Kelly Myers, DOB July 17, 1955, MRN [id number]  PCP:  Hulan Fess, MD  Cardiologist:  Shelva Majestic, MD    History of Present Illness:  Kelly Myers is a 65 y.o. female who presents for a 14 month follow-up cardiology evaluation.   [name] has a history of asthma, endometriosis, hypotension, and hypothyroidism.  In November 2017 she presented to the emergency room with generalized fatigue and neck pain.  She is followed by Dr. Forde Dandy for hypothyroidism and is on levothyroxine prior to her admission, she was febrile, and tachycardic.  She had mildly increased d-dimer.  A chest CT was negative for PE.  BMP was minimally increased at 263 and troponin was 0.13.  LFTs were elevated with alkaline phosphatase of 205, ALT 89, AST 51.  I was concerned that she may have had over suppressed thyroid may also have been a contributor to her tachycardia. An echo Doppler study showed an EF of 55-60% with grade 2 diastolic dysfunction.   I felt her mild troponin elevation was demand ischemia rather than ACS.  Her levothyroxine dose was reduced to 100 g.  She developed mild asthma symptoms on the beta blocker was discontinued.    She has a greater than 15 year history of hypertension and was on lisinopril 10 mg daily.  In the past, she had developed hypokalemia on hydrochlorothiazide.  She is on Singulair for her asthma.  She takes Flonase for allergic rhinitis symptoms.    When I saw her, particularly with her asthma history.  I felt she would do better with ARB therapy and discontinued lisinopril and started initially valsartan at 80 mg with the implants increased to 160 mg daily.  An echo Doppler study did show normal systolic function with grade 2 diastolic dysfunction.  She underwent follow-up laboratory and again her TSH was over suppressed at 0.231.  She is followed by Dr. Tamala Ser because of her thyroid nodules.  He has wanted to keep her TSH  in the over suppressed range and as result, he did not make additional adjustment to her thyroid medication.  She was notified that her generic valsartan was from one of the companies that had purchased the tainted valsartan powder from [place].  As result, she was switched to irbesartan, but initially at a very low dose of just 75 mg.  She continues to be on Singulair for asthma, levothyroxine 100 g for her thyroid abnormalities.    I saw her in September 2018 and last saw her in March 2019.  At that time she had felt well but had noticed occasional skipped beats.  She had noticed occasional skipped beats.  She also has noticed some mild blood pressure elevation which seems to at times be labile, but other times entirely normal. She informed me that her sister 12 years ago suffered 2 myocardial infarctions and had an elevated Lpa.  Her father also is the oldest living known person with [name]'s disease at age 56. She denied chest pressure, PND, orthopnea.  During her evaluation, I recommended initiating Bystolic 2.5 mg for 1 week and if blood pressure remained elevated to titrate to 5 mg. I also recommended she undergo lipoprotein (a) test which was done and this was increased at 90.  She was evaluated by Rosaria Ferries on March 07, 2018.  Her palpitations improved with Bystolic but ultimately this had been switched to bisoprolol due to cost which she  has tolerated well.  She tells me that her husband contacted Covid in November and quarantined in the house.  She and her daughter did not test positive.  I last saw her in January 2021 at which time she felt well.  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  She continues to be active.  Her daughter and son-in-law moved back to her house from [place] after they lost their jobs in education.   She states her blood pressure at home typically is well controlled in the  120s to 130s.  She denied chest pain unaware of recurrent palpitations.  She continued to be on bisoprolol 5 mg daily and irbesartan 150 mg daily for hypertension and levothyroxine for hypothyroidism.   Since I last saw her, her primary physician has retired and she will be establishing with a new primary provider.  She states her blood pressure at home has been controlled typically runs between 1 6 teens over 68-1 22/72.  She has continued to be on Toprol all 5 mg daily, in addition to irbesartan 150 mg daily.  She had lipid studies obtained in January 2022 which showed total cholesterol 183 LDL cholesterol at 94.  Her TSH was 0.81 on January 19, 2020 on levothyroxine 100 mcg.  She denies any palpitations.  She presents for evaluation.  Past Medical History:  Diagnosis Date  . Asthma    Asthma cough- meds control  . Atrophic vaginitis   . Bleeds easily ([place])    "have had clotting studies, tests, I'm within normal limits" (02/12/2016)  . Endometriosis   . 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 History of blood transfusion 1984   following childbirth.  . History of radiation therapy 1963   after adenoid surgery x 2, they "grew back" and she was treated w/ XRT to the throat  . Hypertension   . Hypothyroidism   . Osteopenia 05/2018   T score -1.4  . Pneumonia    "as a child" (02/12/2016)  . Thyroid nodule   . Uterine prolapse     Past Surgical History:  Procedure Laterality Date  . ADENOIDECTOMY  1960; 0768  . ANTERIOR AND POSTERIOR VAGINAL REPAIR  2000s  . BLADDER SUSPENSION  2000s  . CESAREAN SECTION  1984  . COLONOSCOPY WITH PROPOFOL N/A 11/04/2015   Procedure: COLONOSCOPY WITH PROPOFOL;  Surgeon: Garlan Fair, MD;  Location: [facility];  Service: Endoscopy;  Laterality: N/A;  . TUBAL LIGATION  1984  . VAGINAL HYSTERECTOMY  2000s   w/bladder suspension and A/P repair  . WISDOM TOOTH EXTRACTION  1974    Current Medications: Outpatient Medications Prior to  Visit  Medication Sig Dispense Refill  . bisoprolol (ZEBETA) 5 MG tablet TAKE 1 TABLET ONCE DAILY. 90 tablet 0  . Calcium Carbonate-Vitamin D (CALCIUM + D PO) Take 1 tablet by mouth daily.    . Cetirizine HCl 10 MG CAPS Take 10 mg by mouth daily.    Marland Kitchen estradiol (ESTRACE) 0.5 MG tablet Take half tablet daily 90 tablet 4  . fluticasone (FLONASE) 50 MCG/ACT nasal spray Place 2 sprays into both nostrils daily.    Marland Kitchen glucosamine-chondroitin 500-400 MG tablet Take 1 tablet by mouth daily.     . irbesartan (AVAPRO) 150 MG tablet TAKE 1 TABLET ONCE DAILY. 90 tablet 0  . Ivermectin 1 % CREA Apply 1 application topically at bedtime. 30 g  2  . levothyroxine (SYNTHROID, LEVOTHROID) 100 MCG tablet Take 1 tablet (100 mcg total) by mouth daily before breakfast. 30 tablet 0  . montelukast (SINGULAIR) 10 MG tablet Take 10 mg by mouth at bedtime.    . NONFORMULARY OR COMPOUNDED ITEM Estradiol Vaginal Cream 0.02%  S:  One appful intravaginally 3 x weekly. 12 each 11  . Probiotic Product (PROBIOTIC DAILY PO) Take 1 capsule by mouth daily.     . VENTOLIN HFA 108 (90 Base) MCG/ACT inhaler   0   No facility-administered medications prior to visit.     Allergies:   Macrodantin [nitrofurantoin] and Neosporin [neomycin-bacitracin zn-polymyx]   Social History   Socioeconomic History  . Marital status: Married    Spouse name: Not on file  . Number of children: Not on file  . Years of education: Not on file  . Highest education level: Not on file  Occupational History  . Not on file  Tobacco Use  . Smoking status: Former Smoker    Packs/day: 0.50    Years: 10.00    Pack years: 5.00    Types: Cigarettes    Quit date: 1983    Years since quitting: 39.3  . Smokeless tobacco: Never Used  Vaping Use  . Vaping Use: Never used  Substance and Sexual Activity  . Alcohol use: Yes    Alcohol/week: 12.0 standard drinks    Types: 7 Glasses of wine, 5 Standard drinks or equivalent per week  . Drug use: No  .  Sexual activity: Yes    Birth control/protection: Surgical, None  Other Topics Concern  . Not on file  Social History Narrative  . Not on file   Social Determinants of Health   Financial Resource Strain: Not on file  Food Insecurity: Not on file  Transportation Needs: Not on file  Physical Activity: Not on file  Stress: Not on file  Social Connections: Not on file     Family History:  The patient's family history includes Cancer in her father; Heart attack in her sister; Heart disease in her maternal grandfather and sister; Hypertension in her father, mother, and sister.   ROS General: Negative; No fevers, chills, or night sweats;  HEENT: Negative; No changes in vision or hearing, sinus congestion, difficulty swallowing Pulmonary: Positive for mild asthma Cardiovascular: Negative; No chest pain, presyncope, syncope, palpitations GI: Negative; No nausea, vomiting, diarrhea, or abdominal pain GU: Positive for endometriosis Musculoskeletal: Negative; no myalgias, joint pain, or weakness Hematologic/Oncology: Negative; no easy bruising, bleeding Endocrine: Positive for hypothyroidism Neuro: Negative; no changes in balance, headaches Skin: Negative; No rashes or skin lesions Psychiatric: Negative; No behavioral problems, depression Sleep: Negative; No snoring, daytime sleepiness, hypersomnolence, bruxism, restless legs, hypnogognic hallucinations, no cataplexy Other comprehensive 14 point system review is negative.   PHYSICAL EXAM:   VS:  BP (!) 152/82 (BP Location: Left Arm, Patient Position: Sitting, Cuff Size: Normal)   Pulse 75   Ht 5' 4"  (1.626 m)   Wt 130 lb (59 kg)   SpO2 98%   BMI 22.31 kg/m     Repeat blood pressure by me was 128/76  Wt Readings from Last 3 Encounters:  07/23/20 130 lb (59 kg)  05/27/20 131 lb (59.4 kg)  05/16/19 133 lb (60.3 kg)    General: Alert, oriented, no distress.  Skin: normal turgor, no rashes, warm and dry HEENT: Normocephalic,  atraumatic. Pupils equal round and reactive to light; sclera anicteric; extraocular muscles intact; Nose without nasal septal hypertrophy  Mouth/Parynx benign; Mallinpatti scale 3 Neck: No JVD, no carotid bruits; normal carotid upstroke Lungs: clear to ausculatation and percussion; no wheezing or rales Chest wall: without tenderness to palpitation Heart: PMI not displaced, RRR, s1 s2 normal, 1/6 systolic murmur, no diastolic murmur, no rubs, gallops, thrills, or heaves Abdomen: soft, nontender; no hepatosplenomehaly, BS+; abdominal aorta nontender and not dilated by palpation. Back: no CVA tenderness Pulses 2+ Musculoskeletal: full range of motion, normal strength, no joint deformities Extremities: no clubbing cyanosis or edema, Homan's sign negative  Neurologic: grossly nonfocal; Cranial nerves grossly wnl Psychologic: Normal mood and affect   Studies/Labs Reviewed:   EKG:  EKG is ordered today. ECG (independently read by me): NSR at 75; possible LAE  January 2021 ECG (independently read by me): Normal sinus rhythm at 66 bpm.  Poor R wave progression V1 through V3.  Normal intervals.  No ectopy  March 2019 ECG (independently read by me): normal sinus rhythm with mild sinus arrhythmia.  No significant ST changes.  Normal intervals.  September 2018ECG (independently read by me): Normal sinus rhythm with an isolated PVC.  No signal ST-T changes.  QTc interval 448 ms.  June 2018 ECG (independently read by me): Normal sinus rhythm at 85 bpm with occasional PACs.  Possible left atrial enlargement.  Poor R wave progression anteriorly.  QTc interval 456.  PR interval 172 ms.  Recent Labs: BMP Latest Ref Rng & Units 05/02/2020 02/23/2019 07/06/2017  Glucose 65 - 99 mg/dL 88 95 78  BUN 8 - 27 mg/dL 12 13 17   Creatinine 0.57 - 1.00 mg/dL 0.79 0.86 0.84  BUN/Creat Ratio 12 - 28 15 15 20   Sodium 134 - 144 mmol/L 139 138 141  Potassium 3.5 - 5.2 mmol/L 4.9 4.4 4.2  Chloride 96 - 106 mmol/L 100  101 102  CO2 20 - 29 mmol/L 27 25 25   Calcium 8.7 - 10.3 mg/dL 10.0 9.4 9.4     Hepatic Function Latest Ref Rng & Units 05/02/2020 02/23/2019 07/06/2017  Total Protein 6.0 - 8.5 g/dL 7.0 6.8 6.6  Albumin 3.8 - 4.8 g/dL 4.7 4.7 4.5  AST 0 - 40 IU/L 28 27 26   ALT 0 - 32 IU/L 29 24 24   Alk Phosphatase 44 - 121 IU/L 56 51 69  Total Bilirubin 0.0 - 1.2 mg/dL 0.7 0.5 0.6    CBC Latest Ref Rng & Units 05/02/2020 07/06/2017 10/08/2016  WBC 3.4 - 10.8 x10E3/uL 5.0 4.3 6.1  Hemoglobin 11.1 - 15.9 g/dL 14.1 14.1 13.8  Hematocrit 34.0 - 46.6 % 41.3 42.5 40.3  Platelets 150 - 450 x10E3/uL 162 164 164   Lab Results  Component Value Date   MCV 90 05/02/2020   MCV 90 07/06/2017   MCV 88 10/08/2016   Lab Results  Component Value Date   TSH 1.160 07/06/2017   No results found for: HGBA1C   BNP    Component Value Date/Time   BNP 263.0 (H) 02/12/2016 1220    ProBNP No results found for: PROBNP   Lipid Panel     Component Value Date/Time   CHOL 183 05/02/2020 0912   TRIG 69 05/02/2020 0912   HDL 76 05/02/2020 0912   CHOLHDL 2.4 05/02/2020 0912   LDLCALC 94 05/02/2020 0912     RADIOLOGY: No results found.   Additional studies/ records that were reviewed today include:  Reviewed her hospital admission records from November 2017.  I reviewed her follow-up office visit with Rosaria Ferries.  Laboratory was reviewed as  was her echo Doppler data.    ASSESSMENT:    1. Essential hypertension   2. Palpitations   3. Hypothyroidism, unspecified type   4. Elevated Lp(a)   5. Family history of [name]'s disease     PLAN:   Ms Micheletti is a 65 year-old female who has a long-standing history of hypertension, as well as a history of hypothyroidism on levothyroxine replacement followed by Dr. Forde Dandy. Remotely, she has been documented to have thyroid nodules and for this reason, Dr. Forde Dandy has consistently tried to keep her TSH over suppressed.  She also has a history of asthma and  endometriosis.  She has a family history of [name]'s disease.  I had seen her during her hospitalization in November 2017 when she was tachycardic and TSH was over suppressed.  Her last echo Doppler study in November 2017 showed  normal systolic function with grade 2 diastolic dysfunction.  Because of her asthma, I switched her from ACE- inhibition to ARB therapy.  She also had experienced episodic palpitations and when last seen with blood pressure elevation I elected to initiate Bystolic.  She tolerated Bystolic well, but due to cost she ultimately was changed to bisoprolol 5 mg daily.  Presently, she was hypertensive upon arrival to the office today but on repeat by me her blood pressure was excellent at 128/76 and she states her blood pressure at home is consistently in the normal range.  She has continued to be on bisoprolol 5 mg daily and irbesartan 150 mg daily.  Her lipid studies in January 2020 showed an LDL of 94 with total cholesterol 183.  Remotely, her lipoprotein a level was 77.8, improved from 90.  Recent LFTs are normal.  She denies any recent asthma flares but takes Ventolin on an as-needed basis.  She continues to be active and denies any chest pain.  She will be going to [place] next week to see at [facility] after not having traveled throughout the Covid pandemic.  As long as she remains stable I will see her in 1 year for reevaluation or sooner as needed.  Medication Adjustments/Labs and Tests Ordered: Current medicines are reviewed at length with the patient today.  Concerns regarding medicines are outlined above.  Medication changes, Labs and Tests ordered today are listed in the Patient Instructions below. Patient Instructions  Medication Instructions:  No changes  *If you need a refill on your cardiac medications before your next appointment, please call your pharmacy*   Lab Work: None ordered  If you have labs (blood work) drawn today and your tests are completely normal,  you will receive your results only by: Marland Kitchen MyChart Message (if you have MyChart) OR . A paper copy in the mail If you have any lab test that is abnormal or we need to change your treatment, we will call you to review the results.   Testing/Procedures: None ordered  Follow-Up: At [facility], you and your health needs are our priority.  As part of our continuing mission to provide you with exceptional heart care, we have created designated Provider Care Teams.  These Care Teams include your primary Cardiologist (physician) and Advanced Practice Providers (APPs -  Physician Assistants and Nurse Practitioners) who all work together to provide you with the care you need, when you need it.  We recommend signing up for the patient portal called "MyChart".  Sign up information is provided on this After Visit Summary.  MyChart is used to connect with patients for  Virtual Visits (Telemedicine).  Patients are able to view lab/test results, encounter notes, upcoming appointments, etc.  Non-urgent messages can be sent to your provider as well.   To learn more about what you can do with MyChart, go to [email or web address].    Your next appointment:   12 month(s)  The format for your next appointment:   In Person  Provider:   Shelva Majestic, MD        Signed, Shelva Majestic, MD, [facility] 07/24/2020 6:51 PM    [place] [place] [address], [place], [place], [place]  [address] Phone: [phone]

## 2020-07-23 NOTE — Patient Instructions (Signed)
Medication Instructions:  No changes  *If you need a refill on your cardiac medications before your next appointment, please call your pharmacy*   Lab Work: None ordered  If you have labs (blood work) drawn today and your tests are completely normal, you will receive your results only by: Marland Kitchen MyChart Message (if you have MyChart) OR . A paper copy in the mail If you have any lab test that is abnormal or we need to change your treatment, we will call you to review the results.   Testing/Procedures: None ordered  Follow-Up: At [facility], you and your health needs are our priority.  As part of our continuing mission to provide you with exceptional heart care, we have created designated Provider Care Teams.  These Care Teams include your primary Cardiologist (physician) and Advanced Practice Providers (APPs -  Physician Assistants and Nurse Practitioners) who all work together to provide you with the care you need, when you need it.  We recommend signing up for the patient portal called "MyChart".  Sign up information is provided on this After Visit Summary.  MyChart is used to connect with patients for Virtual Visits (Telemedicine).  Patients are able to view lab/test results, encounter notes, upcoming appointments, etc.  Non-urgent messages can be sent to your provider as well.   To learn more about what you can do with MyChart, go to [email or web address].    Your next appointment:   12 month(s)  The format for your next appointment:   In Person  Provider:   Shelva Majestic, MD

## 2020-08-04 ENCOUNTER — Other Ambulatory Visit: Payer: Self-pay | Admitting: Cardiovascular Disease

## 2020-08-21 ENCOUNTER — Other Ambulatory Visit: Payer: Self-pay | Admitting: Cardiovascular Disease

## 2020-09-18 ENCOUNTER — Ambulatory Visit (INDEPENDENT_AMBULATORY_CARE_PROVIDER_SITE_OTHER): Payer: Medicare Other | Admitting: Podiatry

## 2020-09-18 ENCOUNTER — Other Ambulatory Visit: Payer: Self-pay

## 2020-09-18 DIAGNOSIS — M21621 Bunionette of right foot: Secondary | ICD-10-CM | POA: Diagnosis not present

## 2020-09-18 DIAGNOSIS — M779 Enthesopathy, unspecified: Secondary | ICD-10-CM

## 2020-09-18 DIAGNOSIS — I5189 Other ill-defined heart diseases: Secondary | ICD-10-CM | POA: Insufficient documentation

## 2020-09-18 NOTE — Progress Notes (Signed)
Subjective:   Patient ID: Kelly Myers, female   DOB: 65 y.o.   MRN: [id number]   HPI Patient presents with a lot of pain around the outside of the right foot and also has lesion formation plantar aspect both feet which can become painful.  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

## 2021-01-20 ENCOUNTER — Ambulatory Visit: Payer: Medicare Other | Admitting: Dermatology

## 2021-01-20 ENCOUNTER — Other Ambulatory Visit: Payer: Self-pay

## 2021-01-20 ENCOUNTER — Encounter: Payer: Self-pay | Admitting: Dermatology

## 2021-01-20 DIAGNOSIS — L821 Other seborrheic keratosis: Secondary | ICD-10-CM

## 2021-01-20 DIAGNOSIS — L719 Rosacea, unspecified: Secondary | ICD-10-CM

## 2021-01-20 MED ORDER — IVERMECTIN 1 % EX CREA: 1.0000 "application " | TOPICAL_CREAM | Freq: Every day | CUTANEOUS | 2 refills | Status: DC

## 2021-02-02 ENCOUNTER — Encounter: Payer: Self-pay | Admitting: Dermatology

## 2021-02-02 NOTE — Progress Notes (Signed)
   Follow-Up Visit   Subjective  Kelly Myers is a 65 y.o. female who presents for the following: Skin Problem (Rash on face x 1  month- - metronidazole cream given by Pcp- some better but not gone).  Rash on face plus quick skin check Location:  Duration:  Quality:  Associated Signs/Symptoms: Modifying Factors:  Severity:  Timing: Context:   Objective  Well appearing patient in no apparent distress; mood and affect are within normal limits. Left Lower Cutaneous Lip, Right Lower Cutaneous Lip Central facial erythema with small inflammatory papules  Left Upper Back Multiple 2 to 5 mm tan flattopped textured papules    All sun exposed areas plus back examined.   Assessment & Plan    Rosacea Left Lower Cutaneous Lip; Right Lower Cutaneous Lip  6 week 6-week trial with topical ivermectin 9 9  Ivermectin 1 % CREA - Left Lower Cutaneous Lip, Right Lower Cutaneous Lip Apply 1 application topically at bedtime.  Seborrheic keratosis Left Upper Back  Leave if stable      I, [name], MD, have reviewed all documentation for this visit.  The documentation on 02/02/21 for the exam, diagnosis, procedures, and orders are all accurate and complete.

## 2021-02-17 ENCOUNTER — Telehealth: Payer: Self-pay | Admitting: Dermatology

## 2021-02-17 MED ORDER — DOXYCYCLINE HYCLATE 20 MG PO TABS: 20.0000 mg | ORAL_TABLET | Freq: Two times a day (BID) | ORAL | 1 refills | Status: AC

## 2021-02-17 NOTE — Telephone Encounter (Signed)
Patient left message on office voice mail that she was calling back with a progress report and that the topical medication that was prescribed is not working.  Patient stated that she would like Doxycycline 20 mg sent in to [facility].

## 2021-02-17 NOTE — Telephone Encounter (Signed)
Kelly Ellis, MD  You; [facility] 36 minutes ago (1:02 PM)   Manzanita #60 20mg  doxycycline, one by mouth twice daily. 2 refills. Warn her it may not be available generically or "covered"

## 2021-02-26 ENCOUNTER — Other Ambulatory Visit: Payer: Self-pay | Admitting: Obstetrics & Gynecology

## 2021-02-26 DIAGNOSIS — Z1231 Encounter for screening mammogram for malignant neoplasm of breast: Secondary | ICD-10-CM

## 2021-04-02 ENCOUNTER — Ambulatory Visit
Admission: RE | Admit: 2021-04-02 | Discharge: 2021-04-02 | Disposition: A | Discharge status: Home / Self Care | Payer: Medicare Other | Source: Ambulatory Visit | Attending: Obstetrics & Gynecology | Admitting: Obstetrics & Gynecology

## 2021-04-02 DIAGNOSIS — Z1231 Encounter for screening mammogram for malignant neoplasm of breast: Secondary | ICD-10-CM

## 2021-05-28 ENCOUNTER — Other Ambulatory Visit: Payer: Self-pay

## 2021-05-28 ENCOUNTER — Ambulatory Visit (INDEPENDENT_AMBULATORY_CARE_PROVIDER_SITE_OTHER): Payer: Medicare Other | Admitting: Nurse Practitioner

## 2021-05-28 ENCOUNTER — Encounter: Payer: Self-pay | Admitting: Nurse Practitioner

## 2021-05-28 VITALS — BP 122/76 | Ht 64.0 in | Wt 134.0 lb

## 2021-05-28 DIAGNOSIS — Z01419 Encounter for gynecological examination (general) (routine) without abnormal findings: Secondary | ICD-10-CM

## 2021-05-28 DIAGNOSIS — Z780 Asymptomatic menopausal state: Secondary | ICD-10-CM

## 2021-05-28 DIAGNOSIS — M8588 Other specified disorders of bone density and structure, other site: Secondary | ICD-10-CM | POA: Diagnosis not present

## 2021-05-28 DIAGNOSIS — N951 Menopausal and female climacteric states: Secondary | ICD-10-CM | POA: Diagnosis not present

## 2021-05-28 MED ORDER — ESTRADIOL 10 MCG VA TABS: 1.0000 | ORAL_TABLET | VAGINAL | 3 refills | Status: DC

## 2021-05-28 NOTE — Progress Notes (Signed)
° °  Kelly Myers 1955/10/22 [id number]   History:  66 y.o. G2P1102 presents for annual exam without GYN complaints. Postmenopausal - on ERT. She is taking Estradiol 0.25 mg most days but is not fully consistent with use. Using vaginal estrogen cream for dryness but feels it is messy and would prefer something else.  S/P 2008 TVH with A & P repair. Normal pap and mammogram history. HTN, hypothyroidism managed by PCP.   Gynecologic History No LMP recorded. Patient has had a hysterectomy.   Contraception/Family planning: status post hysterectomy Sexually active: Yes  Health Maintenance Last Pap: 11/17/2010. Results were: Normal Last mammogram: 04/02/2021. Results were: Normal Last colonoscopy: 11/04/2015. Results were: Tubular adenomas. Scheduled in May Last Dexa: 07/23/2020. Results were: T-score -1.2 in spine, all other sites normal  Past medical history, past surgical history, family history and social history were all reviewed and documented in the EPIC chart. Married. 2 daughters, one in [place], one in [place] for law school. No grandchildren. Retired from [employer] business.   ROS:  A ROS was performed and pertinent positives and negatives are included.  Exam:  Vitals:   05/28/21 0955  BP: 122/76  Weight: 134 lb (60.8 kg)  Height: 5\' 4"  (1.626 m)    Body mass index is 23 kg/m.  General appearance:  Normal Thyroid:  Symmetrical, normal in size, without palpable masses or nodularity. Respiratory  Auscultation:  Clear without wheezing or rhonchi Cardiovascular  Auscultation:  Regular rate, without rubs, murmurs or gallops  Edema/varicosities:  Not grossly evident Abdominal  Soft,nontender, without masses, guarding or rebound.  Liver/spleen:  No organomegaly noted  Hernia:  None appreciated  Skin  Inspection:  Grossly normal   Breasts: Examined lying and sitting.   Right: Without masses, retractions, discharge or axillary adenopathy.   Left: Without masses,  retractions, discharge or axillary adenopathy. Gentitourinary   Inguinal/mons:  Normal without inguinal adenopathy  External genitalia:  Normal  BUS/Urethra/Skene's glands:  Normal  Vagina:  Atrophic changes  Cervix:  Absent  Uterus:  Absent  Adnexa/parametria:     Rt: Without masses or tenderness.   Lt: Without masses or tenderness.  Anus and perineum: Non-bleeding hemorrhoids  Digital rectal exam: Normal sphincter tone without palpated masses or tenderness  Assessment/Plan:  66 y.o. [id number] for annual exam.   Well female exam with routine gynecological exam - Education provided on SBEs, importance of preventative screenings, current guidelines, high calcium diet, regular exercise, and multivitamin daily. Labs with PCP.   Menopausal vaginal dryness - Plan: Estradiol 10 MCG TABS vaginal tablet 3x weekly. She has been using compounded estradiol cream but reports it is too messy.   Osteopenia of spine - T-score -1.2 in spine, other sites normal. Improvement in all sites. She has been more consistent with Vitamin D and calcium. She has been doing yoga and walks some. Will plan to repeat DXA next year.   Postmenopausal - On ERT, taking Estradiol 0.25 mg most days. She agrees to stop use as she also feels she is not benefiting much from a low dose.  Screening for cervical cancer - Normal Pap history.  No longer screening per guidelines.  Screening for breast cancer - Normal mammogram history.  Continue annual screenings.  Normal breast exam today.  Screening for colon cancer -2017 colonoscopy. Scheduled in May.   Follow-up in 1 year for medication follow up (vaginal estrogen) and 2 years for breast and pelvic exam.    Tamela Gammon [facility], 10:14 AM 05/28/2021

## 2021-07-07 ENCOUNTER — Other Ambulatory Visit: Payer: Self-pay

## 2021-07-07 ENCOUNTER — Encounter: Payer: Self-pay | Admitting: Dermatology

## 2021-07-07 ENCOUNTER — Ambulatory Visit: Payer: Medicare Other | Admitting: Dermatology

## 2021-07-07 DIAGNOSIS — L820 Inflamed seborrheic keratosis: Secondary | ICD-10-CM

## 2021-07-07 DIAGNOSIS — D1801 Hemangioma of skin and subcutaneous tissue: Secondary | ICD-10-CM | POA: Diagnosis not present

## 2021-07-07 DIAGNOSIS — Z1283 Encounter for screening for malignant neoplasm of skin: Secondary | ICD-10-CM

## 2021-07-07 DIAGNOSIS — L570 Actinic keratosis: Secondary | ICD-10-CM

## 2021-07-26 ENCOUNTER — Encounter: Payer: Self-pay | Admitting: Dermatology

## 2021-07-26 NOTE — Progress Notes (Signed)
? ?  Follow-Up Visit ?  ?Subjective  ?Kelly Myers is a 66 y.o. female who presents for the following: Annual Exam (No concerns. ). ? ?General skin examination, several spots of concern ?Location:  ?Duration:  ?Quality:  ?Associated Signs/Symptoms: ?Modifying Factors:  ?Severity:  ?Timing: ?Context:  ? ?Objective  ?Well appearing patient in no apparent distress; mood and affect are within normal limits. ?Full body exam: No atypical pigmented lesions or nonmelanoma skin cancer. ? ?Left Abdomen (side) - Lower ?Multiple 1 mm smooth red dermal papules ? ?Mid Back, Mid Forehead, Mid Parietal Scalp ?Gritty pink crusts ? ?Left Inframammary Fold, Right Breast ?Inflamed tan flattopped textured papules ? ? ? ?A full examination was performed including scalp, head, eyes, ears, nose, lips, neck, chest, axillae, abdomen, back, buttocks, bilateral upper extremities, bilateral lower extremities, hands, feet, fingers, toes, fingernails, and toenails. All findings within normal limits unless otherwise noted below.  Areas beneath undergarments not fully examined. ? ? ?Assessment & Plan  ? ? ?Encounter for screening for malignant neoplasm of skin ? ?Annual skin examination, encouraged to self examine with spouse twice annually.  Continue ultraviolet protection. ? ?Cherry angioma ?Left Abdomen (side) - Lower ? ?No intervention necessary ? ?Actinic keratosis (3) ?Mid Back; Mid Parietal Scalp; Mid Forehead ? ?Destruction of lesion - Mid Forehead, Mid Parietal Scalp ?Complexity: simple   ?Destruction method: cryotherapy   ?Informed consent: discussed and consent obtained   ?Timeout:  patient name, date of birth, surgical site, and procedure verified ?Lesion destroyed using liquid nitrogen: Yes   ?Cryotherapy cycles:  3 ?Outcome: patient tolerated procedure well with no complications   ?Post-procedure details: wound care instructions given   ? ?Inflamed seborrheic keratosis (2) ?Right Breast; Left Inframammary Fold ? ?Destruction of  lesion - Left Inframammary Fold, Right Breast ?Complexity: simple   ?Destruction method: cryotherapy   ?Informed consent: discussed and consent obtained   ?Lesion destroyed using liquid nitrogen: Yes   ?Cryotherapy cycles:  3 ?Outcome: patient tolerated procedure well with no complications   ? ? ? ? ? ?I, [name], MD, have reviewed all documentation for this visit.  The documentation on 07/26/21 for the exam, diagnosis, procedures, and orders are all accurate and complete. ?

## 2021-08-12 ENCOUNTER — Other Ambulatory Visit: Payer: Self-pay | Admitting: Cardiovascular Disease

## 2021-09-16 ENCOUNTER — Encounter: Payer: Self-pay | Admitting: Cardiovascular Disease

## 2021-09-16 ENCOUNTER — Ambulatory Visit: Payer: Medicare Other | Admitting: Cardiovascular Disease

## 2021-09-16 VITALS — BP 140/68 | HR 65 | Ht 64.0 in | Wt 135.0 lb

## 2021-09-16 DIAGNOSIS — Z8349 Family history of other endocrine, nutritional and metabolic diseases: Secondary | ICD-10-CM

## 2021-09-16 DIAGNOSIS — E7841 Elevated Lipoprotein(a): Secondary | ICD-10-CM | POA: Diagnosis not present

## 2021-09-16 DIAGNOSIS — E039 Hypothyroidism, unspecified: Secondary | ICD-10-CM | POA: Diagnosis not present

## 2021-09-16 DIAGNOSIS — I10 Essential (primary) hypertension: Secondary | ICD-10-CM

## 2021-09-16 NOTE — Progress Notes (Signed)
Cardiology Office Note    Date:  09/16/2021   ID:  Kelly Myers, DOB 05/24/1955, MRN [id number]  PCP:  [place] [place] [facility] [place]  Cardiologist:  Shelva Majestic, MD    History of Present Illness:  Kelly Myers is a 66 y.o. female who presents for a 14 month follow-up cardiology evaluation.   Kelly Myers has a history of asthma, endometriosis, hypotension, and hypothyroidism.  In November 2017 she presented to the emergency room with generalized fatigue and neck pain.  She is followed by Dr. Forde Dandy for hypothyroidism and is on levothyroxine prior to her admission, she was febrile, and tachycardic.  She had mildly increased d-dimer.  A chest CT was negative for PE.  BMP was minimally increased at 263 and troponin was 0.13.  LFTs were elevated with alkaline phosphatase of 205, ALT 89, AST 51.  I was concerned that she may have had over suppressed thyroid may also have been a contributor to her tachycardia. An echo Doppler study showed an EF of 55-60% with grade 2 diastolic dysfunction.   I felt her mild troponin elevation was demand ischemia rather than ACS.  Her levothyroxine dose was reduced to 100 g.  She developed mild asthma symptoms on the beta blocker was discontinued.    She has a greater than 15 year history of hypertension and was on lisinopril 10 mg daily.  In the past, she had developed hypokalemia on hydrochlorothiazide.  She is on Singulair for her asthma.  She takes Flonase for allergic rhinitis symptoms.    When I saw her, particularly with her asthma history.  I felt she would do better with ARB therapy and discontinued lisinopril and started initially valsartan at 80 mg with the implants increased to 160 mg daily.  An echo Doppler study did show normal systolic function with grade 2 diastolic dysfunction.  She underwent follow-up laboratory and again her TSH was over suppressed at 0.231.  She is followed by Dr. Tamala Ser because of her thyroid nodules.  He has  wanted to keep her TSH in the over suppressed range and as result, he did not make additional adjustment to her thyroid medication.  She was notified that her generic valsartan was from one of the companies that had purchased the tainted valsartan powder from [place].  As result, she was switched to irbesartan, but initially at a very low dose of just 75 mg.  She continues to be on Singulair for asthma, levothyroxine 100 g for her thyroid abnormalities.    I saw her in September 2018 and last saw her in March 2019.  At that time she had felt well but had noticed occasional skipped beats.  She had noticed occasional skipped beats.  She also has noticed some mild blood pressure elevation which seems to at times be labile, but other times entirely normal. She informed me that her sister 12 years ago suffered 2 myocardial infarctions and had an elevated Lpa.  Her father also is the oldest living known person with [name]'s disease at age 74. She denied chest pressure, PND, orthopnea.  During her evaluation, I recommended initiating Bystolic 2.5 mg for 1 week and if blood pressure remained elevated to titrate to 5 mg. I also recommended she undergo lipoprotein (a) test which was done and this was increased at 90.  She was evaluated by Rosaria Ferries on March 07, 2018.  Her palpitations improved with Bystolic but ultimately this had been switched to bisoprolol due to  cost which she has tolerated well.  She tells me that her husband contacted Covid in November and quarantined in the house.  She and her daughter did not test positive.  I last saw her in January 2021 at which time she felt well.  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  She continues to be active.  Her daughter and son-in-law moved back to her house from [place] after they lost their jobs in education.   She states her blood pressure at home typically is  well controlled in the 120s to 130s.  She denied chest pain unaware of recurrent palpitations.  She continued to be on bisoprolol 5 mg daily and irbesartan 150 mg daily for hypertension and levothyroxine for hypothyroidism.   I last saw her in on July 23, 2020.  Since her prior evaluation  her primary physician has retired and she will be establishing with a new primary provider.  She states her blood pressure at home has been controlled typically runs between 1 6 teens over 68-1 22/72.  She has continued to be on Toprol all 5 mg daily, in addition to irbesartan 150 mg daily.  She had lipid studies obtained in January 2022 which showed total cholesterol 183 LDL cholesterol at 94.  Her TSH was 0.81 on January 19, 2020 on levothyroxine 100 mcg.  She denies any palpitations.  Remotely, her LP(a) level was 90 which had improved to 77.8.  Since I last saw her, she has felt well from a cardiovascular perspective.  She underwent colonoscopy earlier this year and had a benign polyp removed.  She continues to exercise and walks at least 4 days/week.  She also walks dogs.  She denies chest pain or palpitations.  He states her blood pressure at home typically runs around 125/68.  She denies any exertional dyspnea.  She is unaware of palpitations presyncope or syncope.  She presents for a yearly evaluation.  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 EYE ALLERGIES   History of blood transfusion 1984   following childbirth.   History of radiation therapy 1963   after adenoid surgery x 2, they "grew back" and she was treated w/ XRT to the throat   Hypertension    Hypothyroidism    Osteopenia 05/2018   T score -1.4   Pneumonia    "as a child" (02/12/2016)   Thyroid nodule    Uterine prolapse      Past Surgical History:  Procedure Laterality Date   ADENOIDECTOMY  1960; [place]   COLONOSCOPY WITH PROPOFOL N/A 11/04/2015   Procedure: COLONOSCOPY WITH PROPOFOL;  Surgeon: Garlan Fair, MD;  Location: [facility];  Service: Endoscopy;  Laterality: N/A;   TUBAL LIGATION  1984   VAGINAL HYSTERECTOMY  2000s   w/bladder suspension and A/P repair   WISDOM TOOTH EXTRACTION  1974    Current Medications: Outpatient Medications Prior to Visit  Medication Sig Dispense Refill   bisoprolol (ZEBETA) 5 MG tablet Take 1 tablet (5 mg total) by mouth daily. PATIENT MUST  KEEP SCHEDULED ANNUAL APPOINTMENT FOR FUTURE REFILLS 34 tablet 0   Calcium Carbonate-Vitamin D (CALCIUM + D PO) Take 1 tablet by mouth daily.     Cetirizine HCl 10 MG CAPS Take 10 mg by mouth daily.     Estradiol 10 MCG TABS vaginal tablet Place 1 tablet (10 mcg total) vaginally 3 (three) times a week. 36 tablet 3   fluticasone (FLONASE) 50 MCG/ACT nasal spray Place 2 sprays into both nostrils daily.     glucosamine-chondroitin 500-400 MG tablet Take 1 tablet by mouth daily.      irbesartan (AVAPRO) 150 MG tablet TAKE 1 TABLET ONCE DAILY. 34 tablet 0   levothyroxine (SYNTHROID, LEVOTHROID) 100 MCG tablet Take 1 tablet (100 mcg total) by mouth daily before breakfast. 30 tablet 0   montelukast (SINGULAIR) 10 MG tablet Take 10 mg by mouth at bedtime.     Probiotic Product (PROBIOTIC DAILY PO) Take 1 capsule by mouth daily.      VENTOLIN HFA 108 (90 Base) MCG/ACT inhaler   0   BINAXNOW COVID-19 AG HOME TEST KIT TEST AS DIRECTED TODAY (Patient not taking: Reported on 09/16/2021)     Ivermectin 1 % CREA Apply 1 application topically at bedtime. (Patient not taking: Reported on 09/16/2021) 30 g 2   No facility-administered medications prior to visit.     Allergies:   Metoprolol, Macrodantin [nitrofurantoin], and Neosporin  [neomycin-bacitracin zn-polymyx]   Social History   Socioeconomic History   Marital status: Married    Spouse name: Not on file   Number of children: Not on file   Years of education: Not on file   Highest education level: Not on file  Occupational History   Not on file  Tobacco Use   Smoking status: Former    Packs/day: 0.50    Years: 10.00    Pack years: 5.00    Types: Cigarettes    Quit date: 51    Years since quitting: 40.4   Smokeless tobacco: Never  Vaping Use   Vaping Use: Never used  Substance and Sexual Activity   Alcohol use: Yes    Alcohol/week: 7.0 standard drinks    Types: 7 Glasses of wine per week   Drug use: No   Sexual activity: Yes    Birth control/protection: Surgical    Comment: 1st intercourse 66 yo-Fewer than 5 partners  Other Topics Concern   Not on file  Social History Narrative   Not on file   Social Determinants of Health   Financial Resource Strain: Not on file  Food Insecurity: Not on file  Transportation Needs: Not on file  Physical Activity: Not on file  Stress: Not on file  Social Connections: Not on file     Family History:  The patient's family history includes Cancer in her father; Heart attack in her sister; Heart disease in her maternal grandfather and sister; Hypertension in her father, mother, and sister.   ROS General: Negative; No fevers, chills, or night sweats;  HEENT: Negative; No changes in vision or hearing, sinus congestion, difficulty swallowing Pulmonary: Positive for mild asthma Cardiovascular: Negative; No chest pain, presyncope, syncope, palpitations GI: Negative; No nausea, vomiting, diarrhea, or abdominal pain GU: Positive for endometriosis Musculoskeletal: Negative; no myalgias, joint pain, or weakness Hematologic/Oncology: Negative; no easy bruising, bleeding Endocrine: Positive for hypothyroidism Neuro: Negative; no changes in balance, headaches Skin: Negative; No rashes or skin lesions Psychiatric:  Negative; No behavioral problems, depression Sleep: Negative; No snoring, daytime sleepiness, hypersomnolence, bruxism, restless  legs, hypnogognic hallucinations, no cataplexy Other comprehensive 14 point system review is negative.   PHYSICAL EXAM:   VS:  BP 140/68   Pulse 65   Ht 5' 4"  (1.626 m)   Wt 135 lb (61.2 kg)   SpO2 98%   BMI 23.17 kg/m     Pete blood pressure by me was 140/70  Wt Readings from Last 3 Encounters:  09/16/21 135 lb (61.2 kg)  05/28/21 134 lb (60.8 kg)  07/23/20 130 lb (59 kg)    General: Alert, oriented, no distress.  Skin: normal turgor, no rashes, warm and dry HEENT: Normocephalic, atraumatic. Pupils equal round and reactive to light; sclera anicteric; extraocular muscles intact; Fundi ** Nose without nasal septal hypertrophy Mouth/Parynx benign; Mallinpatti scale 3 Neck: No JVD, no carotid bruits; normal carotid upstroke Lungs: clear to ausculatation and percussion; no wheezing or rales Chest wall: without tenderness to palpitation Heart: PMI not displaced, RRR, s1 s2 normal, 1/6 systolic murmur, no diastolic murmur, no rubs, gallops, thrills, or heaves Abdomen: soft, nontender; no hepatosplenomehaly, BS+; abdominal aorta nontender and not dilated by palpation. Back: no CVA tenderness Pulses 2+ Musculoskeletal: full range of motion, normal strength, no joint deformities Extremities: no clubbing cyanosis or edema, Homan's sign negative  Neurologic: grossly nonfocal; Cranial nerves grossly wnl Psychologic: Normal mood and affect   Studies/Labs Reviewed:   September 16, 2021 ECG (independently read by me): NSR at 65, normal intervals  July 23, 2020 ECG (independently read by me): NSR at 75; possible LAE  January 2021 ECG (independently read by me): Normal sinus rhythm at 66 bpm.  Poor R wave progression V1 through V3.  Normal intervals.  No ectopy  March 2019 ECG (independently read by me): normal sinus rhythm with mild sinus arrhythmia.  No  significant ST changes.  Normal intervals.  September 2018ECG (independently read by me): Normal sinus rhythm with an isolated PVC.  No signal ST-T changes.  QTc interval 448 ms.  June 2018 ECG (independently read by me): Normal sinus rhythm at 85 bpm with occasional PACs.  Possible left atrial enlargement.  Poor R wave progression anteriorly.  QTc interval 456.  PR interval 172 ms.  Recent Labs:    Latest Ref Rng & Units 05/02/2020    9:12 AM 02/23/2019    8:35 AM 07/06/2017    8:31 AM  BMP  Glucose 65 - 99 mg/dL 88   95   78    BUN 8 - 27 mg/dL 12   13   17     Creatinine 0.57 - 1.00 mg/dL 0.79   0.86   0.84    BUN/Creat Ratio 12 - 28 15   15   20     Sodium 134 - 144 mmol/L 139   138   141    Potassium 3.5 - 5.2 mmol/L 4.9   4.4   4.2    Chloride 96 - 106 mmol/L 100   101   102    CO2 20 - 29 mmol/L 27   25   25     Calcium 8.7 - 10.3 mg/dL 10.0   9.4   9.4          Latest Ref Rng & Units 05/02/2020    9:12 AM 02/23/2019    8:35 AM 07/06/2017    8:31 AM  Hepatic Function  Total Protein 6.0 - 8.5 g/dL 7.0   6.8   6.6    Albumin 3.8 - 4.8 g/dL 4.7   4.7  4.5    AST 0 - 40 IU/L 28   27   26     ALT 0 - 32 IU/L 29   24   24     Alk Phosphatase 44 - 121 IU/L 56   51   69    Total Bilirubin 0.0 - 1.2 mg/dL 0.7   0.5   0.6         Latest Ref Rng & Units 05/02/2020    9:12 AM 07/06/2017    8:31 AM 10/08/2016    3:05 PM  CBC  WBC 3.4 - 10.8 x10E3/uL 5.0   4.3   6.1    Hemoglobin 11.1 - 15.9 g/dL 14.1   14.1   13.8    Hematocrit 34.0 - 46.6 % 41.3   42.5   40.3    Platelets 150 - 450 x10E3/uL 162   164   164     Lab Results  Component Value Date   MCV 90 05/02/2020   MCV 90 07/06/2017   MCV 88 10/08/2016   Lab Results  Component Value Date   TSH 1.160 07/06/2017   No results found for: HGBA1C   BNP    Component Value Date/Time   BNP 263.0 (H) 02/12/2016 1220    ProBNP No results found for: PROBNP   Lipid Panel     Component Value Date/Time   CHOL 183  05/02/2020 0912   TRIG 69 05/02/2020 0912   HDL 76 05/02/2020 0912   CHOLHDL 2.4 05/02/2020 0912   LDLCALC 94 05/02/2020 0912     RADIOLOGY: No results found.   Additional studies/ records that were reviewed today include:  Reviewed her hospital admission records from November 2017.  I reviewed her follow-up office visit with Rosaria Ferries.  Laboratory was reviewed as was her echo Doppler data.    ASSESSMENT:    1. Essential hypertension   2. Hypothyroidism, unspecified type   3. Elevated Lp(a)   4. Family history of [name]'s disease     PLAN:   Ms Matura is a 66  year-old female who has a long-standing history of hypertension, as well as a history of hypothyroidism on levothyroxine replacement followed by Dr. Forde Dandy. Remotely, she has been documented to have thyroid nodules and for this reason, Dr. Forde Dandy has consistently tried to keep her TSH over suppressed.  She also has a history of asthma and endometriosis.  She has a family history of [name]'s disease.  I had seen her during her hospitalization in November 2017 when she was tachycardic and TSH was over suppressed.  Her last echo Doppler study in November 2017 showed  normal systolic function with grade 2 diastolic dysfunction.  Because of her asthma, I switched her from ACE- inhibition to ARB therapy.  She also had experienced episodic palpitations and with blood pressure elevation I elected to initiate Bystolic.  She tolerated Bystolic well, but due to cost she ultimately was changed to bisoprolol 5 mg daily.  She states her blood pressure at home is consistently in the normal range often running around 125/68.  She continues to be on bisoprolol 5 mg daily and is on irbesartan 150 mg.  She also is on levothyroxine 100 mcg followed by Dr. Reynold Bowen.  She takes montelukast for her allergies with benefit.  She has not had recent laboratory over the past year.  I am recommending a follow-up comprehensive metabolic panel, CBC, and  lipid studies.  I will not recheck her LP(a) since on recheck  2 years ago this was 33.  She continues to do well and remains active.  Doctor regarding her laboratory results and as long as she is stable I will see her in 1 year for reevaluation.   Medication Adjustments/Labs and Tests Ordered: Current medicines are reviewed at length with the patient today.  Concerns regarding medicines are outlined above.  Medication changes, Labs and Tests ordered today are listed in the Patient Instructions below. Patient Instructions  Medication Instructions:  The current medical regimen is effective;  continue present plan and medications.  *If you need a refill on your cardiac medications before your next appointment, please call your pharmacy*   Follow-Up: At [facility], you and your health needs are our priority.  As part of our continuing mission to provide you with exceptional heart care, we have created designated Provider Care Teams.  These Care Teams include your primary Cardiologist (physician) and Advanced Practice Providers (APPs -  Physician Assistants and Nurse Practitioners) who all work together to provide you with the care you need, when you need it.  We recommend signing up for the patient portal called "MyChart".  Sign up information is provided on this After Visit Summary.  MyChart is used to connect with patients for Virtual Visits (Telemedicine).  Patients are able to view lab/test results, encounter notes, upcoming appointments, etc.  Non-urgent messages can be sent to your provider as well.   To learn more about what you can do with MyChart, go to [email or web address].    Your next appointment:   12 month(s)  The format for your next appointment:   In Person  Provider:   Shelva Majestic, MD           Signed, Shelva Majestic, MD, [facility] 09/16/2021 6:28 PM    [place] [address] [place], [place], [place]  [address] Phone: [phone]

## 2021-09-16 NOTE — Patient Instructions (Signed)

## 2021-09-17 LAB — COMPREHENSIVE METABOLIC PANEL
ALT: 24 IU/L (ref 0–32)
AST: 25 IU/L (ref 0–40)
Albumin/Globulin Ratio: 1.9 (ref 1.2–2.2)
Albumin: 4.5 g/dL (ref 3.8–4.8)
Alkaline Phosphatase: 61 IU/L (ref 44–121)
BUN/Creatinine Ratio: 16 (ref 12–28)
BUN: 13 mg/dL (ref 8–27)
Bilirubin Total: 0.8 mg/dL (ref 0.0–1.2)
CO2: 27 mmol/L (ref 20–29)
Calcium: 9.6 mg/dL (ref 8.7–10.3)
Chloride: 101 mmol/L (ref 96–106)
Creatinine, Ser: 0.8 mg/dL (ref 0.57–1.00)
Globulin, Total: 2.4 g/dL (ref 1.5–4.5)
Glucose: 86 mg/dL (ref 70–99)
Potassium: 4.3 mmol/L (ref 3.5–5.2)
Sodium: 139 mmol/L (ref 134–144)
Total Protein: 6.9 g/dL (ref 6.0–8.5)
eGFR: 81 mL/min/{1.73_m2} (ref >59)

## 2021-09-17 LAB — LIPID PANEL
Chol/HDL Ratio: 2.7 ratio (ref 0.0–4.4)
Cholesterol, Total: 192 mg/dL (ref 100–199)
HDL: 71 mg/dL (ref >39)
LDL Chol Calc (NIH): 106 mg/dL — ABNORMAL HIGH (ref 0–99)
Triglycerides: 81 mg/dL (ref 0–149)
VLDL Cholesterol Cal: 15 mg/dL (ref 5–40)

## 2021-09-17 LAB — CBC
Hematocrit: 39.6 % (ref 34.0–46.6)
Hemoglobin: 13.6 g/dL (ref 11.1–15.9)
MCH: 30.4 pg (ref 26.6–33.0)
MCHC: 34.3 g/dL (ref 31.5–35.7)
MCV: 89 fL (ref 79–97)
Platelets: 147 10*3/uL — ABNORMAL LOW (ref 150–450)
RBC: 4.47 x10E6/uL (ref 3.77–5.28)
RDW: 12.7 % (ref 11.7–15.4)
WBC: 4.2 10*3/uL (ref 3.4–10.8)

## 2021-09-18 ENCOUNTER — Other Ambulatory Visit: Payer: Self-pay | Admitting: Cardiovascular Disease

## 2021-09-22 ENCOUNTER — Ambulatory Visit: Payer: Medicare Other | Admitting: Dermatology

## 2021-09-22 ENCOUNTER — Encounter: Payer: Self-pay | Admitting: Dermatology

## 2021-09-22 DIAGNOSIS — L111 Transient acantholytic dermatosis [Grover]: Secondary | ICD-10-CM | POA: Diagnosis not present

## 2021-09-22 DIAGNOSIS — L821 Other seborrheic keratosis: Secondary | ICD-10-CM

## 2021-09-22 MED ORDER — CLOBETASOL PROPIONATE 0.05 % EX CREA: 1.0000 "application " | TOPICAL_CREAM | Freq: Two times a day (BID) | CUTANEOUS | 2 refills | Status: DC

## 2021-09-24 ENCOUNTER — Other Ambulatory Visit: Payer: Self-pay | Admitting: Cardiovascular Disease

## 2021-09-24 MED ORDER — IRBESARTAN 150 MG PO TABS: 150.0000 mg | ORAL_TABLET | Freq: Every day | ORAL | 3 refills | Status: DC

## 2021-09-24 MED ORDER — BISOPROLOL FUMARATE 5 MG PO TABS: 5.0000 mg | ORAL_TABLET | Freq: Every day | ORAL | 3 refills | Status: DC

## 2021-10-13 ENCOUNTER — Encounter: Payer: Self-pay | Admitting: Dermatology

## 2021-10-13 NOTE — Progress Notes (Signed)
   Follow-Up Visit   Subjective  Kelly Myers is a 66 y.o. female who presents for the following: Rash (Rash between breast x 2 months- + itch - comes and goes tx- cortisone cream, antifungal cream, acne cream ).  New onset rash on chest plus check several spots Location:  Duration:  Quality:  Associated Signs/Symptoms: Modifying Factors:  Severity:  Timing: Context:   Objective  Well appearing patient in no apparent distress; mood and affect are within normal limits. Chest - Medial [facility]) Dozen subtle slightly raised and inflamed 2 mm pink papules, compatible with Grovers disease.  Discussed obtaining biopsies but since this would not alter initial treatment choice this was deferred.  Chest - Medial [facility]), Left Inframammary Fold, Mid Back, Right Inframammary Fold Several dozen tan noninflamed 5 to 12 mm flattopped keratotic papules    All skin waist up examined.   Assessment & Plan    Grover's disease Chest - Medial (Center)  Clobetasol applied daily for 30 days, avoid inframammary fold.  Follow-up by MyChart at that time.  Related Medications clobetasol cream (TEMOVATE) 3.40 % Apply 1 application  topically 2 (two) times daily.  Seborrheic keratosis (4) Chest - Medial [facility]); Left Inframammary Fold; Right Inframammary Fold; Mid Back  No intervention initiated      I, Kelly Monarch, MD, have reviewed all documentation for this visit.  The documentation on 10/13/21 for the exam, diagnosis, procedures, and orders are all accurate and complete.

## 2021-11-11 ENCOUNTER — Telehealth: Payer: Self-pay | Admitting: Cardiovascular Disease

## 2021-11-11 NOTE — Telephone Encounter (Signed)
[facility] is calling to get the labs and last appt notes faxed over to their office.    Fax # - [phone]

## 2021-11-12 NOTE — Telephone Encounter (Signed)
Verified information needed and fax number. June 2023 office visit and labs faxed to Marda Stalker at [facility].

## 2021-11-12 NOTE — Telephone Encounter (Signed)
Thank you :)

## 2021-11-21 ENCOUNTER — Telehealth: Payer: Self-pay | Admitting: Dermatology

## 2021-11-21 NOTE — Telephone Encounter (Signed)
Patient left message on office voice mail saying that her rash has still not cleared completely and wants to know what she can do?  Patient stated that she uses [employer].

## 2021-11-24 MED ORDER — MINOCYCLINE HCL 50 MG PO CAPS
50.0000 mg | ORAL_CAPSULE | Freq: Two times a day (BID) | ORAL | 1 refills | Status: AC
Start: 2021-11-24 — End: 2022-01-23

## 2021-11-24 NOTE — Addendum Note (Signed)
Addended by: Sheran Lawless on: 11/24/2021 12:22 PM   Modules accepted: Orders

## 2022-02-18 ENCOUNTER — Other Ambulatory Visit: Payer: Self-pay | Admitting: Nurse Practitioner

## 2022-02-18 DIAGNOSIS — Z1231 Encounter for screening mammogram for malignant neoplasm of breast: Secondary | ICD-10-CM

## 2022-04-22 ENCOUNTER — Ambulatory Visit
Admission: RE | Admit: 2022-04-22 | Discharge: 2022-04-22 | Disposition: A | Discharge status: Home / Self Care | Payer: Medicare Other | Source: Ambulatory Visit | Attending: Nurse Practitioner | Admitting: Nurse Practitioner

## 2022-04-22 DIAGNOSIS — Z1231 Encounter for screening mammogram for malignant neoplasm of breast: Secondary | ICD-10-CM

## 2022-04-30 ENCOUNTER — Other Ambulatory Visit: Payer: Self-pay | Admitting: Nurse Practitioner

## 2022-04-30 DIAGNOSIS — N951 Menopausal and female climacteric states: Secondary | ICD-10-CM

## 2022-04-30 NOTE — Telephone Encounter (Signed)
Medication refill request: estradiol   Last AEX:  05-28-21 TW Next AEX: 07-09-22 Last MMG (if hormonal medication request): 04-22-22 density B/BIRADS 1 negative  Refill authorized: Please advise.   Med pended for one month supply.

## 2022-07-08 ENCOUNTER — Encounter: Payer: Self-pay | Admitting: Nurse Practitioner

## 2022-07-08 ENCOUNTER — Ambulatory Visit: Payer: Medicare Other | Admitting: Dermatology

## 2022-07-09 ENCOUNTER — Ambulatory Visit (INDEPENDENT_AMBULATORY_CARE_PROVIDER_SITE_OTHER): Payer: Medicare Other | Admitting: Nurse Practitioner

## 2022-07-09 ENCOUNTER — Encounter: Payer: Self-pay | Admitting: Nurse Practitioner

## 2022-07-09 VITALS — BP 124/82 | HR 48

## 2022-07-09 DIAGNOSIS — M8588 Other specified disorders of bone density and structure, other site: Secondary | ICD-10-CM

## 2022-07-09 DIAGNOSIS — N941 Unspecified dyspareunia: Secondary | ICD-10-CM | POA: Diagnosis not present

## 2022-07-09 DIAGNOSIS — N951 Menopausal and female climacteric states: Secondary | ICD-10-CM | POA: Diagnosis not present

## 2022-07-09 MED ORDER — ESTRADIOL 10 MCG VA TABS: 1.0000 | ORAL_TABLET | VAGINAL | 3 refills | Status: DC

## 2022-07-09 NOTE — Progress Notes (Signed)
   Acute Office Visit  Subjective:    Patient ID: Kelly Myers, female    DOB: 04-Oct-1955, 67 y.o.   MRN: [id number]   HPI 67 y.o. [id number] presents today for medication management. Using vaginal estrogen three times weekly for dryness and painful intercourse. Good management. Stopped oral estradiol last year. Having multiple hot flashes daily and some night sweating. Does feel they are better than when she originally started HRT but still very bothersome. Tried patch in the past but does not tolerate adhesive.    Review of Systems  Constitutional: Negative.   Endocrine: Positive for heat intolerance.  Genitourinary: Negative.        Objective:    Physical Exam Constitutional:      Appearance: Normal appearance.   GU: Not indicated  BP 124/82   Pulse (!) 48   SpO2 95%  Wt Readings from Last 3 Encounters:  09/16/21 135 lb (61.2 kg)  05/28/21 134 lb (60.8 kg)  07/23/20 130 lb (59 kg)        Assessment & Plan:   Problem List Items Addressed This Visit       Musculoskeletal and Integument   Osteopenia   Relevant Orders   DG Bone Density   Other Visit Diagnoses     Menopausal vaginal dryness    -  Primary   Relevant Medications   Estradiol 10 MCG TABS vaginal tablet (Start on 07/10/2022)   Dyspareunia in female       Vasomotor symptoms due to menopause          Plan: Refills provided for vaginal estrogen. Discussed management of vasomotor symptoms with OTC supplements, SSRI, Veozah and restarting ERT. She would like to try OTC supplements first. Options provided. Due for DXA in April and will schedule that now. Return in 1 year for breast and pelvic exam or sooner if needed.      [place], 10:52 AM 07/09/2022

## 2022-07-27 ENCOUNTER — Telehealth: Payer: Self-pay | Admitting: Cardiovascular Disease

## 2022-07-27 DIAGNOSIS — E7841 Elevated Lipoprotein(a): Secondary | ICD-10-CM

## 2022-07-27 DIAGNOSIS — I10 Essential (primary) hypertension: Secondary | ICD-10-CM

## 2022-07-27 NOTE — Telephone Encounter (Signed)
Will forward to Juanda Crumble PA  for review and recommendations on if pt requires labs ./cy

## 2022-07-27 NOTE — Telephone Encounter (Signed)
Patient would like to know if she needs to have labs prior to 6/05 appointment with Juanda Crumble. Please advise.

## 2022-08-04 ENCOUNTER — Ambulatory Visit (INDEPENDENT_AMBULATORY_CARE_PROVIDER_SITE_OTHER): Payer: Medicare Other

## 2022-08-04 ENCOUNTER — Other Ambulatory Visit: Payer: Self-pay | Admitting: Nurse Practitioner

## 2022-08-04 DIAGNOSIS — Z780 Asymptomatic menopausal state: Secondary | ICD-10-CM

## 2022-08-04 DIAGNOSIS — M8589 Other specified disorders of bone density and structure, multiple sites: Secondary | ICD-10-CM

## 2022-08-04 DIAGNOSIS — M8588 Other specified disorders of bone density and structure, other site: Secondary | ICD-10-CM

## 2022-08-04 DIAGNOSIS — N941 Unspecified dyspareunia: Secondary | ICD-10-CM

## 2022-08-04 DIAGNOSIS — Z13820 Encounter for screening for osteoporosis: Secondary | ICD-10-CM | POA: Diagnosis not present

## 2022-08-04 DIAGNOSIS — N951 Menopausal and female climacteric states: Secondary | ICD-10-CM

## 2022-08-21 NOTE — Telephone Encounter (Signed)
Detailed  message left for pt to come in week before appt for fasting lab work ./cy

## 2022-08-27 ENCOUNTER — Ambulatory Visit: Payer: Medicare Other | Admitting: Podiatry

## 2022-08-27 ENCOUNTER — Ambulatory Visit (INDEPENDENT_AMBULATORY_CARE_PROVIDER_SITE_OTHER): Payer: Medicare Other

## 2022-08-27 ENCOUNTER — Other Ambulatory Visit: Payer: Self-pay | Admitting: Podiatry

## 2022-08-27 DIAGNOSIS — M7751 Other enthesopathy of right foot: Secondary | ICD-10-CM

## 2022-08-27 DIAGNOSIS — M21621 Bunionette of right foot: Secondary | ICD-10-CM

## 2022-08-27 DIAGNOSIS — M79674 Pain in right toe(s): Secondary | ICD-10-CM

## 2022-08-27 MED ORDER — TRIAMCINOLONE ACETONIDE 10 MG/ML IJ SUSP
10.0000 mg | Freq: Once | INTRAMUSCULAR | Status: AC
Start: 2022-08-27 — End: 2022-08-27
  Administered 2022-08-27: 10 mg

## 2022-08-27 NOTE — Progress Notes (Signed)
Subjective:   Patient ID: Kelly Myers, female   DOB: 67 y.o.   MRN: [id number]   HPI Patient presents stating she is getting a lot of pain around the bone on the right foot and it just started recently.  Been almost 2 years since she has been here and this has been a recent occurrence    ROS      Objective:  Physical Exam  Neurovascular status intact with patient found to have inflammation fluid around the fifth MPJ right plantarly with lesion formation that is formed also.  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

## 2022-09-14 DIAGNOSIS — L820 Inflamed seborrheic keratosis: Secondary | ICD-10-CM | POA: Diagnosis not present

## 2022-09-14 DIAGNOSIS — D225 Melanocytic nevi of trunk: Secondary | ICD-10-CM | POA: Diagnosis not present

## 2022-09-14 DIAGNOSIS — L719 Rosacea, unspecified: Secondary | ICD-10-CM | POA: Diagnosis not present

## 2022-09-14 DIAGNOSIS — D2272 Melanocytic nevi of left lower limb, including hip: Secondary | ICD-10-CM | POA: Diagnosis not present

## 2022-09-14 DIAGNOSIS — L304 Erythema intertrigo: Secondary | ICD-10-CM | POA: Diagnosis not present

## 2022-09-14 DIAGNOSIS — L814 Other melanin hyperpigmentation: Secondary | ICD-10-CM | POA: Diagnosis not present

## 2022-09-14 DIAGNOSIS — L578 Other skin changes due to chronic exposure to nonionizing radiation: Secondary | ICD-10-CM | POA: Diagnosis not present

## 2022-09-14 DIAGNOSIS — L821 Other seborrheic keratosis: Secondary | ICD-10-CM | POA: Diagnosis not present

## 2022-09-15 NOTE — Progress Notes (Signed)
Cardiology Office Note:    Date:  09/17/2022   ID:  Kelly Myers, DOB 1955/08/24, MRN [id number]  PCP:  Adrian Prince, MD [facility] Cardiologist: Nicki Guadalajara, MD   Reason for visit: 1 year follow-up  History of Present Illness:    Kelly Myers is a 67 y.o. female with a hx of asthma, endometriosis, hypotension, and hypothyroidism. LDL 106, HDL 71, Trig 81 in 09/2021.  Cardiology follows her for hypertension.    She was last seen by Dr. Tresa Endo in 09/2021 and was doing well.  Today, patient feels well.  She is active walking daily and doing yoga 3 times a week.  She states her blood pressure is well-controlled at home running 120s over 80s.  She is planning to get her blood work done tomorrow including repeat lipids.  She mentions that her sister had MI at age 75 -who is also healthy like herself.  She states family history of elevated LP(a).  She states she has been on blood pressure medication for the past 20 years.  No issues with her medications.  She denies chest pain, shortness of breath, PND, orthopnea, lower extreme edema, dizziness and syncope.    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 EYE ALLERGIES   History of blood transfusion 1984   following childbirth.   History of radiation therapy 1963   after adenoid surgery x 2, they "grew back" and she was treated w/ XRT to the throat   Hypertension    Hypothyroidism    Osteopenia 05/2018   T score -1.4   Pneumonia    "as a child" (02/12/2016)   Thyroid nodule    Uterine prolapse     Past Surgical History:  Procedure Laterality Date   ADENOIDECTOMY  1960; 1963   ANTERIOR AND POSTERIOR VAGINAL REPAIR  2000s   BLADDER SUSPENSION  2000s   CESAREAN SECTION  1984   COLONOSCOPY WITH PROPOFOL N/A  11/04/2015   Procedure: COLONOSCOPY WITH PROPOFOL;  Surgeon: Charolett Bumpers, MD;  Location: [facility];  Service: Endoscopy;  Laterality: N/A;   TUBAL LIGATION  1984   VAGINAL HYSTERECTOMY  2000s   w/bladder suspension and A/P repair   WISDOM TOOTH EXTRACTION  1974    Current Medications: Current Meds  Medication Sig   bisoprolol (ZEBETA) 5 MG tablet Take 1 tablet (5 mg total) by mouth daily.   Calcium Carbonate-Vitamin D (CALCIUM + D PO) Take 1 tablet by mouth daily.   Cetirizine HCl 10 MG CAPS Take 10 mg by mouth daily.   Estradiol 10 MCG TABS vaginal tablet Place 1 tablet (10 mcg total) vaginally 3 (three) times a week.   fluticasone (FLONASE) 50 MCG/ACT nasal spray Place 2 sprays into both nostrils daily.   glucosamine-chondroitin 500-400 MG tablet Take 1 tablet by mouth daily.    irbesartan (AVAPRO) 150 MG tablet Take 1 tablet (150 mg total) by mouth daily.   levothyroxine (SYNTHROID, LEVOTHROID) 100 MCG tablet Take 1 tablet (100 mcg total) by mouth daily before breakfast.   montelukast (SINGULAIR) 10 MG tablet Take 10 mg by mouth at bedtime.   Oyster Shell Calcium 500 MG TABS one tab twice daily Oral   Probiotic Product (PROBIOTIC DAILY PO) Take  1 capsule by mouth daily.    VENTOLIN HFA 108 (90 Base) MCG/ACT inhaler    XIIDRA 5 % SOLN Apply to eye 2 (two) times daily.     Allergies:   Metoprolol, Macrodantin [nitrofurantoin], and Neosporin [neomycin-bacitracin zn-polymyx]   Social History   Socioeconomic History   Marital status: Married    Spouse name: Not on file   Number of children: Not on file   Years of education: Not on file   Highest education level: Not on file  Occupational History   Not on file  Tobacco Use   Smoking status: Former    Packs/day: 0.50    Years: 10.00    Additional pack years: 0.00    Total pack years: 5.00    Types: Cigarettes    Quit date: 52    Years since quitting: 41.4   Smokeless tobacco: Never  Vaping Use   Vaping Use: Never  used  Substance and Sexual Activity   Alcohol use: Yes    Alcohol/week: 7.0 standard drinks of alcohol    Types: 7 Glasses of wine per week   Drug use: No   Sexual activity: Yes    Birth control/protection: Surgical, Post-menopausal    Comment: Hyst, First IC >58 y/o, <5 Partners  Other Topics Concern   Not on file  Social History Narrative   Not on file   Social Determinants of Health   Financial Resource Strain: Not on file  Food Insecurity: Not on file  Transportation Needs: Not on file  Physical Activity: Not on file  Stress: Not on file  Social Connections: Not on file     Family History: The patient's family history includes Cancer in her father; Heart attack in her sister; Heart disease in her maternal grandfather and sister; Hypertension in her father, mother, and sister.  ROS:   Please see the history of present illness.     EKGs/Labs/Other Studies Reviewed:    EKG:  The ekg ordered today demonstrates sinus bradycardia with heart rate 59.  Recent Labs: No results found for requested labs within last 365 days.   Recent Lipid Panel Lab Results  Component Value Date/Time   CHOL 192 09/17/2021 09:45 AM   TRIG 81 09/17/2021 09:45 AM   HDL 71 09/17/2021 09:45 AM   LDLCALC 106 (H) 09/17/2021 09:45 AM    Physical Exam:    VS:  BP (!) 158/82   Pulse (!) 56   Ht 5\' 4"  (1.626 m)   Wt 134 lb 9.6 oz (61.1 kg)   SpO2 96%   BMI 23.10 kg/m    No data found.   Wt Readings from Last 3 Encounters:  09/17/22 134 lb 9.6 oz (61.1 kg)  09/16/21 135 lb (61.2 kg)  05/28/21 134 lb (60.8 kg)     GEN:  Well nourished, well developed in no acute distress HEENT: Normal NECK: No JVD; No carotid bruits CARDIAC: RRR, no murmurs, rubs, gallops RESPIRATORY:  Clear to auscultation without rales, wheezing or rhonchi  ABDOMEN: Soft, non-tender, non-distended MUSCULOSKELETAL: No edema SKIN: Warm and dry NEUROLOGIC:  Alert and oriented PSYCHIATRIC:  Normal affect     ASSESSMENT AND PLAN   Hypertension, well controlled at home -With asthmas hx, switched from ACEI to ARB -Bystolic too high cost -Refill her Bisoprolol and irbesartan.  Check BMET. -Goal BP is <130/80.  Recommend DASH diet (high in vegetables, fruits, low-fat dairy products, whole grains, poultry, fish, and nuts and low in sweets, sugar-sweetened beverages, and red  meats), salt restriction and increase physical activity.  Hyperlipidemia with goal LDL less than 100 Elevated LP(a) -Recheck lipids and LP(a). -Recommend coronary calcium score to help assess cardiac risk. -Currently not on cholesterol medication. -Discussed cholesterol lowering diets - Mediterranean diet, DASH diet, vegetarian diet, low-carbohydrate diet and avoidance of trans fats.  Discussed healthier choice substitutes.  Nuts, high-fiber foods, and fiber supplements may also improve lipids.    Disposition - Follow-up in 1 year with Dr. Tresa Endo.    Signed, Cannon Kettle, PA-C  09/17/2022 12:07 PM    [facility]

## 2022-09-16 ENCOUNTER — Ambulatory Visit: Payer: Medicare Other | Admitting: Physician Assistant

## 2022-09-17 ENCOUNTER — Ambulatory Visit: Payer: Medicare Other | Attending: Physician Assistant | Admitting: Physician Assistant

## 2022-09-17 VITALS — BP 158/82 | HR 56 | Ht 64.0 in | Wt 134.6 lb

## 2022-09-17 DIAGNOSIS — E7841 Elevated Lipoprotein(a): Secondary | ICD-10-CM

## 2022-09-17 DIAGNOSIS — E785 Hyperlipidemia, unspecified: Secondary | ICD-10-CM | POA: Diagnosis not present

## 2022-09-17 DIAGNOSIS — I10 Essential (primary) hypertension: Secondary | ICD-10-CM | POA: Diagnosis not present

## 2022-09-17 MED ORDER — IRBESARTAN 150 MG PO TABS: 150.0000 mg | ORAL_TABLET | Freq: Every day | ORAL | 3 refills | Status: DC

## 2022-09-17 MED ORDER — BISOPROLOL FUMARATE 5 MG PO TABS: 5.0000 mg | ORAL_TABLET | Freq: Every day | ORAL | 3 refills | Status: DC

## 2022-09-17 NOTE — Patient Instructions (Signed)
Medication Instructions:  No Changes *If you need a refill on your cardiac medications before your next appointment, please call your pharmacy*   Lab Work: LPA  If you have labs (blood work) drawn today and your tests are completely normal, you will receive your results only by: MyChart Message (if you have MyChart) OR A paper copy in the mail If you have any lab test that is abnormal or we need to change your treatment, we will call you to review the results.   Testing/Procedures:  has ordered a CT coronary calcium score.   Test locations:  [facility]  [facility]  This is $99 out of pocket.   Coronary CalciumScan A coronary calcium scan is an imaging test used to look for deposits of calcium and other fatty materials (plaques) in the inner lining of the blood vessels of the heart (coronary arteries). These deposits of calcium and plaques can partly clog and narrow the coronary arteries without producing any symptoms or warning signs. This puts a person at risk for a heart attack. This test can detect these deposits before symptoms develop. Tell a health care provider about: Any allergies you have. All medicines you are taking, including vitamins, herbs, eye drops, creams, and over-the-counter medicines. Any problems you or family members have had with anesthetic medicines. Any blood disorders you have. Any surgeries you have had. Any medical conditions you have. Whether you are pregnant or may be pregnant. What are the risks? Generally, this is a safe procedure. However, problems may occur, including: Harm to a pregnant woman and her unborn baby. This test involves the use of radiation. Radiation exposure can be dangerous to a pregnant woman and her unborn baby. If you are pregnant, you generally should not have this procedure done. Slight increase in the risk of cancer. This is because  of the radiation involved in the test. What happens before the procedure? No preparation is needed for this procedure. What happens during the procedure? You will undress and remove any jewelry around your neck or chest. You will put on a hospital gown. Sticky electrodes will be placed on your chest. The electrodes will be connected to an electrocardiogram (ECG) machine to record a tracing of the electrical activity of your heart. A CT scanner will take pictures of your heart. During this time, you will be asked to lie still and hold your breath for 2-3 seconds while a picture of your heart is being taken. The procedure may vary among health care providers and hospitals. What happens after the procedure? You can get dressed. You can return to your normal activities. It is up to you to get the results of your test. Ask your health care provider, or the department that is doing the test, when your results will be ready. Summary A coronary calcium scan is an imaging test used to look for deposits of calcium and other fatty materials (plaques) in the inner lining of the blood vessels of the heart (coronary arteries). Generally, this is a safe procedure. Tell your health care provider if you are pregnant or may be pregnant. No preparation is needed for this procedure. A CT scanner will take pictures of your heart. You can return to your normal activities after the scan is done. This information is not intended to replace advice given to you by your health care provider. Make sure you discuss any questions you have with your health  care provider. Document Released: 09/26/2007 Document Revised: 02/17/2016 Document Reviewed: 02/17/2016 Elsevier Interactive Patient Education  2017 [employer].    Follow-Up: At [facility], you and your health needs are our priority.  As part of our continuing mission to provide you with exceptional heart care, we have created designated Provider Care  Teams.  These Care Teams include your primary Cardiologist (physician) and Advanced Practice Providers (APPs -  Physician Assistants and Nurse Practitioners) who all work together to provide you with the care you need, when you need it.  We recommend signing up for the patient portal called "MyChart".  Sign up information is provided on this After Visit Summary.  MyChart is used to connect with patients for Virtual Visits (Telemedicine).  Patients are able to view lab/test results, encounter notes, upcoming appointments, etc.  Non-urgent messages can be sent to your provider as well.   To learn more about what you can do with MyChart, go to [email or web address].    Your next appointment:   1 year(s)  Provider:   Nicki Guadalajara, MD

## 2022-09-18 ENCOUNTER — Other Ambulatory Visit: Payer: Self-pay

## 2022-09-18 DIAGNOSIS — I10 Essential (primary) hypertension: Secondary | ICD-10-CM

## 2022-09-18 DIAGNOSIS — E785 Hyperlipidemia, unspecified: Secondary | ICD-10-CM

## 2022-09-18 DIAGNOSIS — E7841 Elevated Lipoprotein(a): Secondary | ICD-10-CM | POA: Diagnosis not present

## 2022-09-19 LAB — LIPID PANEL
Chol/HDL Ratio: 2.6 ratio (ref 0.0–4.4)
Cholesterol, Total: 195 mg/dL (ref 100–199)

## 2022-09-19 LAB — BASIC METABOLIC PANEL
BUN: 14 mg/dL (ref 8–27)
Chloride: 99 mmol/L (ref 96–106)

## 2022-09-19 LAB — LIPOPROTEIN A (LPA)

## 2022-09-22 ENCOUNTER — Telehealth: Payer: Self-pay

## 2022-09-22 NOTE — Telephone Encounter (Addendum)
Called patient regarding results. Patient had understanding of results.----- Message from Cannon Kettle, PA-C sent at 09/21/2022  1:49 PM EDT ----- Check with hx of Hyperlipidemia with goal LDL less than 100 -LDL is 161.  Patient does have high HDL (good cholesterol) and triglycerides at goal.  -Will follow-up on LP(a) and coronary calcium score and then give recommendations

## 2022-09-23 ENCOUNTER — Telehealth: Payer: Self-pay

## 2022-09-23 DIAGNOSIS — E785 Hyperlipidemia, unspecified: Secondary | ICD-10-CM

## 2022-09-23 LAB — LIPID PANEL
HDL: 76 mg/dL (ref >39)
LDL Chol Calc (NIH): 104 mg/dL — ABNORMAL HIGH (ref 0–99)
Triglycerides: 81 mg/dL (ref 0–149)
VLDL Cholesterol Cal: 15 mg/dL (ref 5–40)

## 2022-09-23 LAB — BASIC METABOLIC PANEL
BUN/Creatinine Ratio: 17 (ref 12–28)
CO2: 26 mmol/L (ref 20–29)
Calcium: 9.7 mg/dL (ref 8.7–10.3)
Creatinine, Ser: 0.82 mg/dL (ref 0.57–1.00)
Glucose: 93 mg/dL (ref 70–99)
Potassium: 4.6 mmol/L (ref 3.5–5.2)
Sodium: 136 mmol/L (ref 134–144)
eGFR: 78 mL/min/{1.73_m2} (ref >59)

## 2022-09-23 MED ORDER — ROSUVASTATIN CALCIUM 10 MG PO TABS: 10.0000 mg | ORAL_TABLET | Freq: Every day | ORAL | 3 refills | Status: DC

## 2022-09-23 NOTE — Telephone Encounter (Addendum)
Called patient regarding results. Patient had understanding of results.Patient advised to begin Crestor 10 mg daily, and to have repeat Lipid Panel done in 3 months. Lab order mailed to patient.----- Message from Cannon Kettle, PA-C sent at 09/23/2022 12:32 PM EDT ----- With elevated lipoprotein & family history of CAD, recommend starting Crestor 10 mg daily. Keep appointment for coronary calcium score.  Kelly Myers -if okay with patient, please send Crestor 10 mg daily, #90 with 3 refills. Recheck fasting lipids in 3 months

## 2022-10-23 ENCOUNTER — Ambulatory Visit (HOSPITAL_BASED_OUTPATIENT_CLINIC_OR_DEPARTMENT_OTHER)
Admission: RE | Admit: 2022-10-23 | Discharge: 2022-10-23 | Disposition: A | Discharge status: Home / Self Care | Payer: Medicare Other | Source: Ambulatory Visit | Attending: Physician Assistant | Admitting: Physician Assistant

## 2022-10-23 DIAGNOSIS — E7841 Elevated Lipoprotein(a): Secondary | ICD-10-CM | POA: Insufficient documentation

## 2022-10-23 DIAGNOSIS — E785 Hyperlipidemia, unspecified: Secondary | ICD-10-CM | POA: Insufficient documentation

## 2022-10-23 DIAGNOSIS — I10 Essential (primary) hypertension: Secondary | ICD-10-CM | POA: Insufficient documentation

## 2022-10-30 ENCOUNTER — Telehealth: Payer: Self-pay

## 2022-10-30 NOTE — Telephone Encounter (Addendum)
Results seen by patient via Mychart.----- Message from Cannon Kettle sent at 10/27/2022 10:14 PM EDT ----- Coronary calcium score of 26.9. This was 61st percentile for age-, race-, and sex-matched controls.  If CAC is 1 to 99, it is reasonable to initiate statin therapy for patients >=68 years of age. -- This is more evidence that starting Crestor is the right thing to do to reduce your cardiac risk.

## 2022-11-03 ENCOUNTER — Telehealth: Payer: Self-pay

## 2022-11-03 NOTE — Telephone Encounter (Addendum)
Results viewed results via MyChart.----- Message from Cannon Kettle sent at 10/27/2022 10:14 PM EDT ----- Coronary calcium score of 26.9. This was 61st percentile for age-, race-, and sex-matched controls.  If CAC is 1 to 99, it is reasonable to initiate statin therapy for patients >=68 years of age. -- This is more evidence that starting Crestor is the right thing to do to reduce your cardiac risk.

## 2022-11-03 NOTE — Telephone Encounter (Addendum)
Results viewed by patient via Mychart----- Message from Cannon Kettle sent at 10/27/2022 10:14 PM EDT ----- Coronary calcium score of 26.9. This was 61st percentile for age-, race-, and sex-matched controls.  If CAC is 1 to 99, it is reasonable to initiate statin therapy for patients >=68 years of age. -- This is more evidence that starting Crestor is the right thing to do to reduce your cardiac risk.

## 2022-11-17 DIAGNOSIS — Z23 Encounter for immunization: Secondary | ICD-10-CM | POA: Diagnosis not present

## 2022-11-17 DIAGNOSIS — E7849 Other hyperlipidemia: Secondary | ICD-10-CM | POA: Diagnosis not present

## 2022-11-17 DIAGNOSIS — J309 Allergic rhinitis, unspecified: Secondary | ICD-10-CM | POA: Diagnosis not present

## 2022-11-17 DIAGNOSIS — Z6823 Body mass index (BMI) 23.0-23.9, adult: Secondary | ICD-10-CM | POA: Diagnosis not present

## 2022-11-17 DIAGNOSIS — Z0000 Encounter for general adult medical examination without abnormal findings: Secondary | ICD-10-CM | POA: Diagnosis not present

## 2022-12-07 DIAGNOSIS — H2513 Age-related nuclear cataract, bilateral: Secondary | ICD-10-CM | POA: Diagnosis not present

## 2022-12-07 DIAGNOSIS — H5213 Myopia, bilateral: Secondary | ICD-10-CM | POA: Diagnosis not present

## 2022-12-07 DIAGNOSIS — H16223 Keratoconjunctivitis sicca, not specified as Sjogren's, bilateral: Secondary | ICD-10-CM | POA: Diagnosis not present

## 2022-12-08 ENCOUNTER — Encounter: Payer: Self-pay | Admitting: Sports Medicine

## 2022-12-08 ENCOUNTER — Ambulatory Visit: Payer: Medicare Other | Admitting: Sports Medicine

## 2022-12-08 ENCOUNTER — Ambulatory Visit
Admission: RE | Admit: 2022-12-08 | Discharge: 2022-12-08 | Disposition: A | Discharge status: Home / Self Care | Payer: Medicare Other | Source: Ambulatory Visit | Attending: Sports Medicine | Admitting: Sports Medicine

## 2022-12-08 VITALS — BP 130/84 | Ht 64.0 in | Wt 134.0 lb

## 2022-12-08 DIAGNOSIS — M1611 Unilateral primary osteoarthritis, right hip: Secondary | ICD-10-CM

## 2022-12-08 DIAGNOSIS — M160 Bilateral primary osteoarthritis of hip: Secondary | ICD-10-CM | POA: Diagnosis not present

## 2022-12-08 NOTE — Patient Instructions (Signed)
[facility] Located in: [employer] [facility] [address], [place], [place] [address] Phone: [phone]

## 2022-12-08 NOTE — Progress Notes (Signed)
   Subjective:    Patient ID: [name], female    DOB: [birth year], 67 y.o.   MRN: [id number]  HPI chief complaint: Right hip pain  Patient is a very pleasant 67 year old female who presents today with 1 year of intermittent right hip pain.  Pain is primarily in the groin but will radiate to the lateral and posterior hip at times.  It is most noticeable with certain yoga poses.  She also gets pain with activities of daily living.  Pain improves when she is away on vacation.  Past medical history reviewed Medications reviewed Allergies reviewed    Review of Systems As above    Objective:   Physical Exam  Well-developed, well-nourished.  No acute distress  Right hip: There is some limited internal rotation actively by about 50% when compared to the uninvolved left hip.  Full external rotation.  Internal rotation does reproduce her pain.  Positive Trendelenburg.  X-rays including AP pelvis and lateral right hip show some mild degenerative changes.  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  She may wean to home exercise program per the therapist discretion.  She may use over-the-counter Tylenol or NSAIDs as needed for pain.  She will limit her activity based on pain as well.  Follow-up as needed.  This note was dictated using Dragon naturally speaking software and may contain errors in syntax, spelling, or content which have not been identified prior to signing this note.

## 2022-12-15 DIAGNOSIS — M25559 Pain in unspecified hip: Secondary | ICD-10-CM | POA: Diagnosis not present

## 2022-12-18 DIAGNOSIS — S80862A Insect bite (nonvenomous), left lower leg, initial encounter: Secondary | ICD-10-CM | POA: Diagnosis not present

## 2022-12-28 DIAGNOSIS — M25559 Pain in unspecified hip: Secondary | ICD-10-CM | POA: Diagnosis not present

## 2022-12-29 ENCOUNTER — Ambulatory Visit: Payer: Medicare Other | Admitting: Sports Medicine

## 2022-12-30 DIAGNOSIS — M25559 Pain in unspecified hip: Secondary | ICD-10-CM | POA: Diagnosis not present

## 2023-01-04 DIAGNOSIS — Z713 Dietary counseling and surveillance: Secondary | ICD-10-CM | POA: Diagnosis not present

## 2023-01-04 DIAGNOSIS — I10 Essential (primary) hypertension: Secondary | ICD-10-CM | POA: Diagnosis not present

## 2023-01-04 DIAGNOSIS — M25559 Pain in unspecified hip: Secondary | ICD-10-CM | POA: Diagnosis not present

## 2023-01-06 DIAGNOSIS — M25559 Pain in unspecified hip: Secondary | ICD-10-CM | POA: Diagnosis not present

## 2023-01-07 DIAGNOSIS — E785 Hyperlipidemia, unspecified: Secondary | ICD-10-CM | POA: Diagnosis not present

## 2023-01-07 LAB — LIPID PANEL
Chol/HDL Ratio: 1.8 ratio (ref 0.0–4.4)
Cholesterol, Total: 148 mg/dL (ref 100–199)
HDL: 84 mg/dL (ref >39)
LDL Chol Calc (NIH): 51 mg/dL (ref 0–99)
Triglycerides: 61 mg/dL (ref 0–149)
VLDL Cholesterol Cal: 13 mg/dL (ref 5–40)

## 2023-01-11 DIAGNOSIS — M25559 Pain in unspecified hip: Secondary | ICD-10-CM | POA: Diagnosis not present

## 2023-01-13 ENCOUNTER — Telehealth: Payer: Self-pay

## 2023-01-13 DIAGNOSIS — M25559 Pain in unspecified hip: Secondary | ICD-10-CM | POA: Diagnosis not present

## 2023-01-13 DIAGNOSIS — M542 Cervicalgia: Secondary | ICD-10-CM | POA: Diagnosis not present

## 2023-01-13 DIAGNOSIS — M545 Low back pain, unspecified: Secondary | ICD-10-CM | POA: Diagnosis not present

## 2023-01-13 NOTE — Telephone Encounter (Addendum)
Results viewed by patient via MyChart.----- Message from Cannon Kettle sent at 01/12/2023  8:42 PM EDT ----- Cholesterol numbers much improved/ at goal with Crestor 10mg  daily.   Continue current medications.

## 2023-01-16 DIAGNOSIS — M542 Cervicalgia: Secondary | ICD-10-CM | POA: Diagnosis not present

## 2023-01-16 DIAGNOSIS — M25559 Pain in unspecified hip: Secondary | ICD-10-CM | POA: Diagnosis not present

## 2023-01-16 DIAGNOSIS — M545 Low back pain, unspecified: Secondary | ICD-10-CM | POA: Diagnosis not present

## 2023-01-18 DIAGNOSIS — M542 Cervicalgia: Secondary | ICD-10-CM | POA: Diagnosis not present

## 2023-01-18 DIAGNOSIS — M25559 Pain in unspecified hip: Secondary | ICD-10-CM | POA: Diagnosis not present

## 2023-01-18 DIAGNOSIS — M545 Low back pain, unspecified: Secondary | ICD-10-CM | POA: Diagnosis not present

## 2023-01-20 DIAGNOSIS — M25559 Pain in unspecified hip: Secondary | ICD-10-CM | POA: Diagnosis not present

## 2023-01-20 DIAGNOSIS — M542 Cervicalgia: Secondary | ICD-10-CM | POA: Diagnosis not present

## 2023-01-20 DIAGNOSIS — M545 Low back pain, unspecified: Secondary | ICD-10-CM | POA: Diagnosis not present

## 2023-01-25 DIAGNOSIS — M25559 Pain in unspecified hip: Secondary | ICD-10-CM | POA: Diagnosis not present

## 2023-01-25 DIAGNOSIS — M545 Low back pain, unspecified: Secondary | ICD-10-CM | POA: Diagnosis not present

## 2023-01-25 DIAGNOSIS — M542 Cervicalgia: Secondary | ICD-10-CM | POA: Diagnosis not present

## 2023-02-02 ENCOUNTER — Telehealth: Payer: Self-pay | Admitting: *Deleted

## 2023-02-02 DIAGNOSIS — M1611 Unilateral primary osteoarthritis, right hip: Secondary | ICD-10-CM

## 2023-02-02 NOTE — Telephone Encounter (Signed)
New PT order placed and faxed to [facility].    Pt called states [facility] says they need a new order to continue the physical therapy beyond pt's Ins [employer] approved treatments.  (Pt also ask that we contact her [facility] Medicare plan for prior approval for any addt'l ordered PT.(Advised pt our office will contact the Ins Co)

## 2023-02-08 ENCOUNTER — Encounter: Payer: Self-pay | Admitting: Nurse Practitioner

## 2023-02-15 DIAGNOSIS — I10 Essential (primary) hypertension: Secondary | ICD-10-CM | POA: Diagnosis not present

## 2023-02-15 DIAGNOSIS — E039 Hypothyroidism, unspecified: Secondary | ICD-10-CM | POA: Diagnosis not present

## 2023-02-15 DIAGNOSIS — D126 Benign neoplasm of colon, unspecified: Secondary | ICD-10-CM | POA: Diagnosis not present

## 2023-02-15 DIAGNOSIS — L111 Transient acantholytic dermatosis [Grover]: Secondary | ICD-10-CM | POA: Diagnosis not present

## 2023-02-15 DIAGNOSIS — M8580 Other specified disorders of bone density and structure, unspecified site: Secondary | ICD-10-CM | POA: Diagnosis not present

## 2023-02-15 DIAGNOSIS — E782 Mixed hyperlipidemia: Secondary | ICD-10-CM | POA: Diagnosis not present

## 2023-02-15 DIAGNOSIS — J309 Allergic rhinitis, unspecified: Secondary | ICD-10-CM | POA: Diagnosis not present

## 2023-04-24 IMAGING — MG MM DIGITAL SCREENING BILAT W/ TOMO AND CAD
8 series · 9 of 24 positions shown · non-contrast
Comparison: Previous exam(s).

CLINICAL DATA: Screening.

EXAM:
DIGITAL SCREENING BILATERAL MAMMOGRAM WITH TOMOSYNTHESIS AND CAD
TECHNIQUE: Bilateral screening digital craniocaudal and mediolateral oblique
mammograms were obtained. Bilateral screening digital breast
tomosynthesis was performed. The images were evaluated with
computer-aided detection.

[L CC synth-2D]
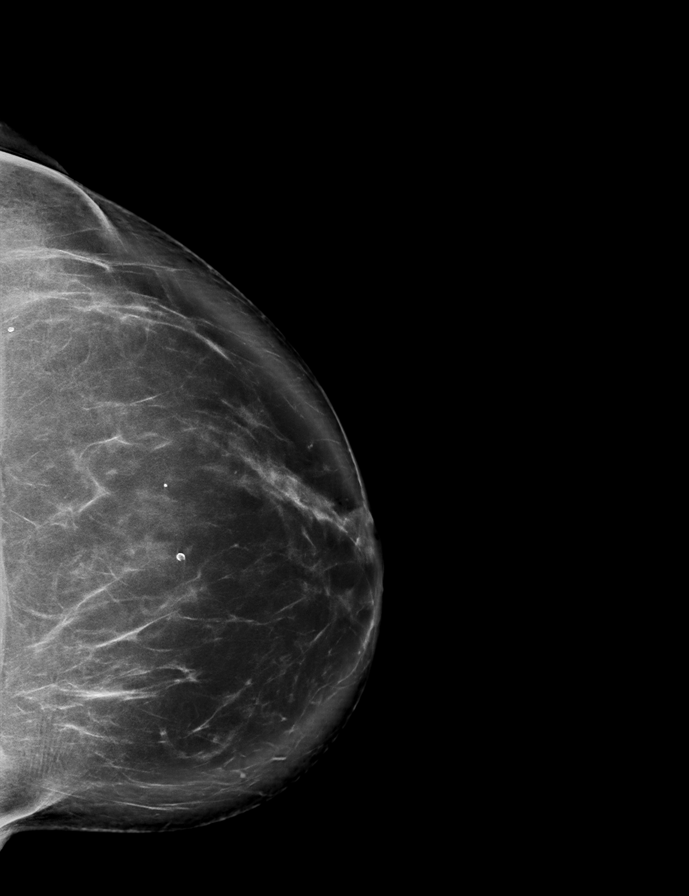

[R CC synth-2D]
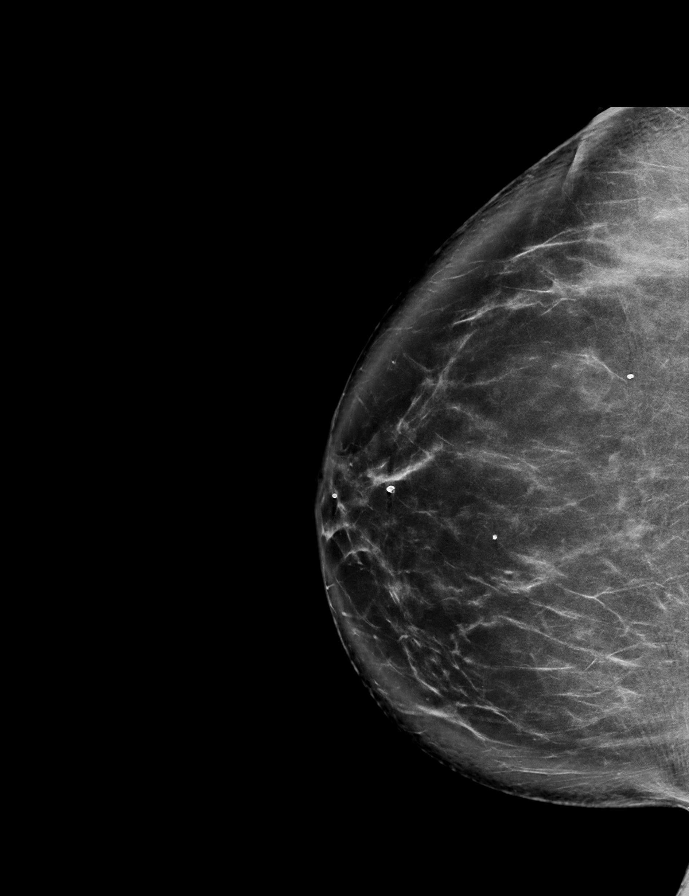

[L MLO synth-2D]
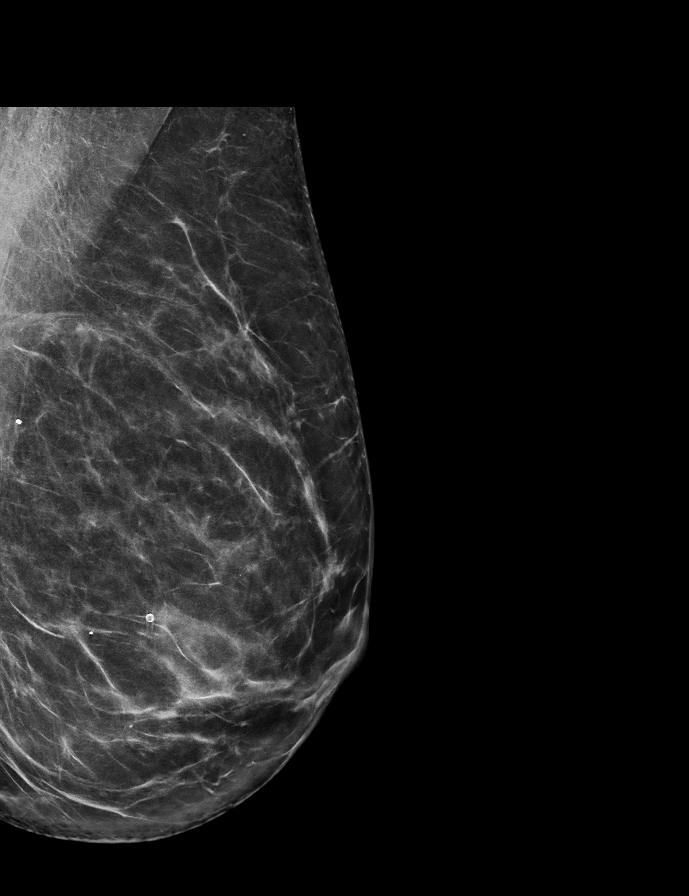

[R MLO synth-2D]
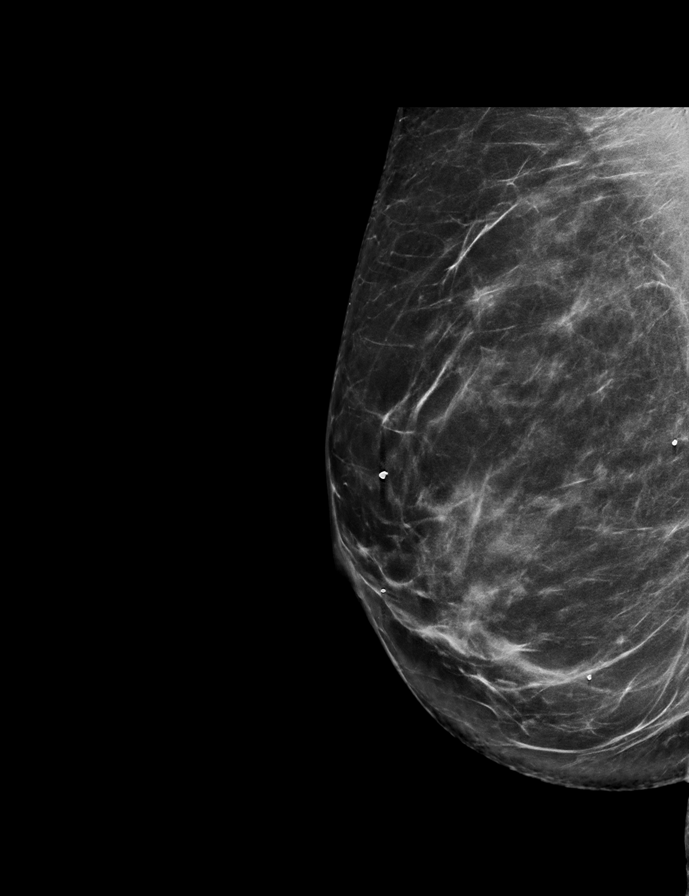

[R CC tomo · 2 of 93 frames shown]
[frame 30/93]
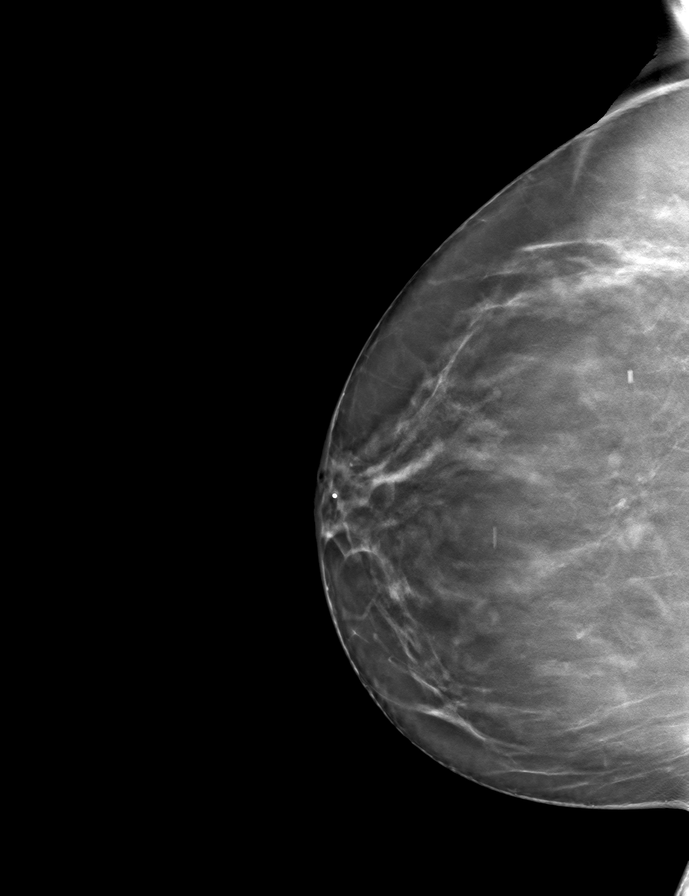
[frame 47/93]
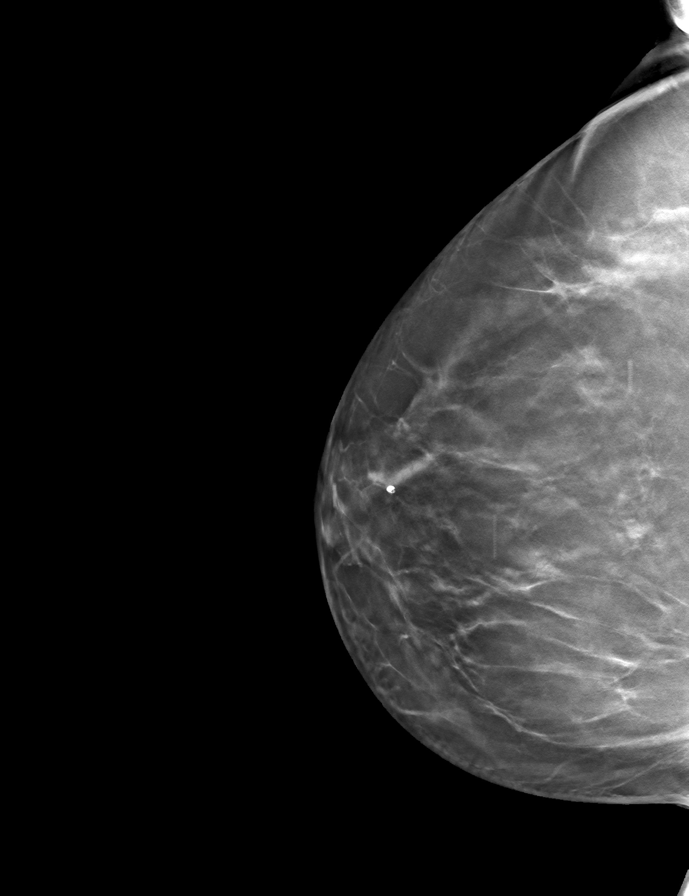

[L CC tomo · tomo slice 51/102.0]
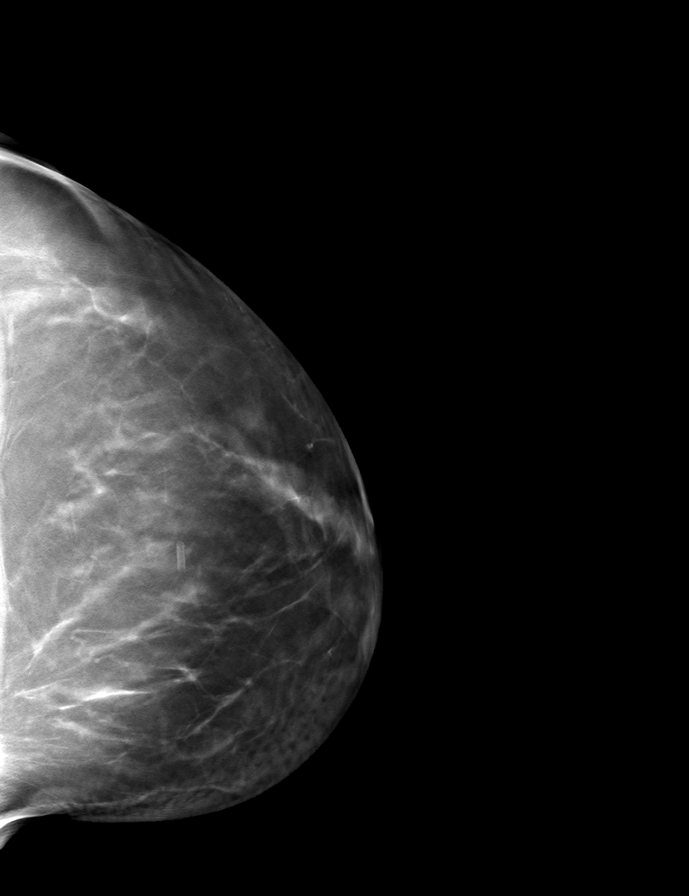

[L MLO tomo · tomo slice 37/73.0]
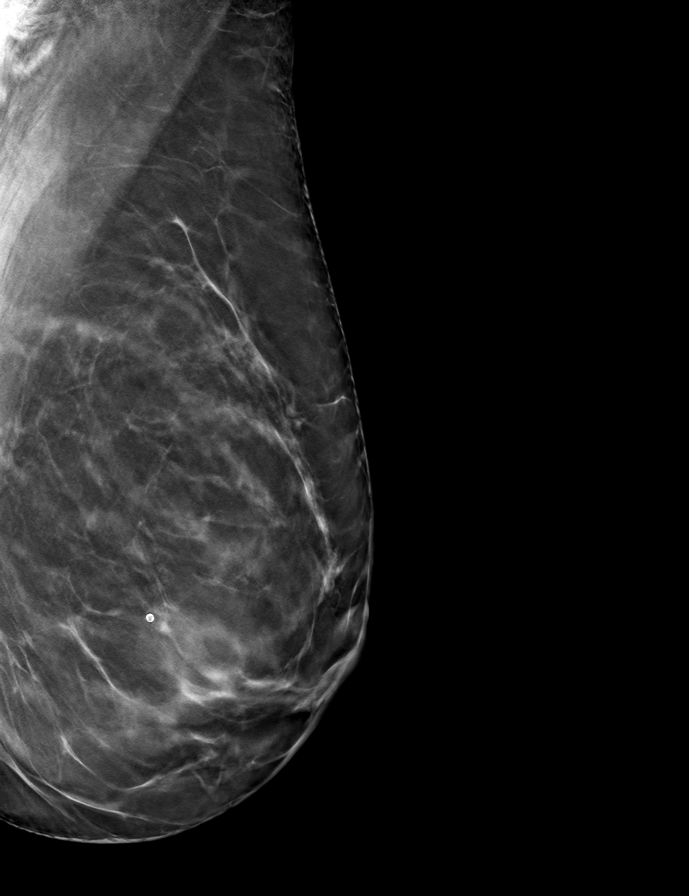

[R MLO tomo · tomo slice 39/78.0]
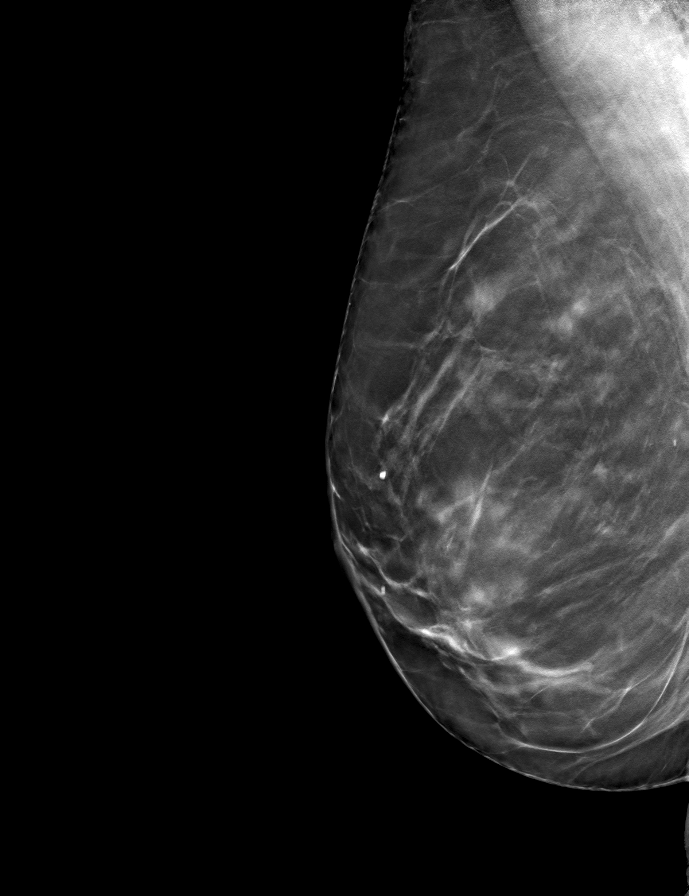

[9 of 24 positions shown; findings below may reference images not displayed]

ACR Breast Density Category b: There are scattered areas of
fibroglandular density.
FINDINGS: There are no findings suspicious for malignancy.
IMPRESSION: No mammographic evidence of malignancy. A result letter of this
screening mammogram will be mailed directly to the patient.

RECOMMENDATION:
Screening mammogram in one year. (Code:51-O-LD2)

BI-RADS CATEGORY  1: Negative.

## 2023-04-29 ENCOUNTER — Ambulatory Visit (INDEPENDENT_AMBULATORY_CARE_PROVIDER_SITE_OTHER): Payer: PPO | Admitting: Psychology

## 2023-04-29 DIAGNOSIS — F4323 Adjustment disorder with mixed anxiety and depressed mood: Secondary | ICD-10-CM

## 2023-04-29 NOTE — Progress Notes (Addendum)
[facility] Initial Adult Intake   Name: Kelly Myers Date: 04/29/2023 MRN: [id number] DOB: [birth year] PCP: Adrian Prince, MD  Time spent: 1:00 pm - 1:59 pm  Guardian/Payee:  self   Paperwork requested: Yes    Today I met in person with Kelly Myers  for in office face-to-face individual psychotherapy.  Reason for Visit /Presenting Problem: Kelly Myers presents after she discovered that her husband has been using "heavy" drugs (ie., cocaine, Adderall).  Addiction problems have been part of his history (ie., gambling).  She confronted him, asked him to stop, and predictably he wasn't able to.  He also goes through 2 bottles of Vodka a week and often drinks socially.  Kelly Myers has been feeling depressed and anxious.  She has always been a happy, resourceful and resilient.  Background History:  Kelly Myers and Kelly Myers (71) they have been married for 49 years.  They have two daughters [name] (15) and [name] (40).  [name] lives in [place] and is single.  She works for [name].  [name] lives in [place] and is married to a [employer].  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  Kelly Myers and Kelly Myers are both originally from [place].  They both have aging parents. Her parents are divorced.  Mother lives at [facility] and is 70 y.o.  Her father is deceased.  He died when he was 72 and the longest lived [name]'s disease.  Kelly Myers's mother is 20 y.o. and lives at home.  His brother is a [name] and lives with their mother and might be a Chartered loss adjuster.   Mental Status Exam: Appearance:   Casual     Behavior:  Appropriate  Motor:  Normal  Speech/Language:   NA  Affect:  Appropriate  Mood:  anxious and depressed  Thought process:  normal  Thought content:    WNL  Sensory/Perceptual disturbances:    WNL  Orientation:  oriented to person, place, and time/date  Attention:  NA  Concentration:  Good  Memory:  WNL  Fund of knowledge:    Good  Insight:    Good  Judgment:   Good  Impulse Control:  Good   Reported Symptoms:  worrying about different things, not being able to control her worrying, difficulties relaxing, difficulties concentrating, disrupted appetite, difficulties getting motivated or enjoy normally pleasurable things, feeling bad about herself  Risk Assessment: Danger to Self:  No Self-injurious Behavior: No Danger to Others: No Duty to Warn:no Physical Aggression / Violence:No  Access to Firearms a concern: No  Gang Involvement:No .  Substance Abuse History: Current substance abuse: No     Past Psychiatric History:   Previous psychological history is significant for n/a Outpatient Providers:  History of Psych Hospitalization: No  Psychological Testing:  n/a    Abuse History:  Victim of: No.,  n/a    Report needed: No. Victim of Neglect:No. Perpetrator of  n/a   Witness / Exposure to Domestic Violence: No   Protective Services Involvement: No  Witness to [employer] Violence:  No   Family History:  Family History  Problem Relation Age of Onset   Hypertension Mother    Hypertension Father    Cancer Father        lung   Hypertension Sister    Heart disease Sister    Heart attack Sister    Heart disease Maternal Grandfather     Living situation: the patient  lives with their spouse  Sexual Orientation: Straight  Relationship Status: married  Name of spouse / other:  Kelly Myers (71) they have been married for 49 years If a parent, number of children / ages: 2  Support Systems: her children, a sister  Surveyor, quantity Stress:  No   Income/Employment/Disability: Pension, both Kelly Myers and her husband are retired.  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   Stressors: Marital or family conflict    Strengths: Family and Hopefulness  Barriers:  None   Legal History: Pending legal issue / charges:  n/a. 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 EYE ALLERGIES   History of blood transfusion 1984   following childbirth.   History of radiation therapy 1963   after adenoid surgery x 2, they "grew back" and she was treated w/ XRT to the throat   Hypertension    Hypothyroidism    Osteopenia 05/2018   T score -1.4   Pneumonia    "as a child" (02/12/2016)   Thyroid nodule    Uterine prolapse     Past Surgical History:  Procedure Laterality Date   ADENOIDECTOMY  1960; 1963   ANTERIOR AND POSTERIOR VAGINAL REPAIR  2000s   BLADDER SUSPENSION  2000s   CESAREAN SECTION  1984   COLONOSCOPY WITH PROPOFOL N/A 11/04/2015   Procedure: COLONOSCOPY WITH PROPOFOL;  Surgeon: Charolett Bumpers, MD;  Location: [facility];  Service: Endoscopy;  Laterality: N/A;   TUBAL LIGATION  1984   VAGINAL HYSTERECTOMY  2000s   w/bladder suspension and A/P repair   WISDOM TOOTH EXTRACTION  1974    Medications: Current Outpatient Medications  Medication Sig Dispense Refill   bisoprolol (ZEBETA) 5 MG tablet Take 1 tablet (5 mg total) by mouth daily. 90 tablet 3   Calcium Carbonate-Vitamin D (CALCIUM + D PO) Take 1 tablet by mouth daily.     Cetirizine HCl 10 MG CAPS Take 10 mg by mouth daily.     Estradiol 10 MCG TABS vaginal tablet Place 1 tablet (10 mcg total) vaginally 3 (three) times a week. 36 tablet 3   fluticasone (FLONASE) 50  MCG/ACT nasal spray Place 2 sprays into both nostrils daily.     glucosamine-chondroitin 500-400 MG tablet Take 1 tablet by mouth daily.      irbesartan (AVAPRO) 150 MG tablet Take 1 tablet (150 mg total) by mouth daily. 90 tablet 3   levothyroxine (SYNTHROID, LEVOTHROID) 100 MCG tablet Take 1 tablet (100 mcg total) by mouth daily before breakfast. 30 tablet 0   montelukast (SINGULAIR) 10 MG tablet Take 10 mg by mouth at bedtime.     Oyster Shell Calcium 500 MG TABS one tab twice daily Oral     Probiotic Product (PROBIOTIC DAILY PO) Take 1 capsule by mouth daily.      rosuvastatin (CRESTOR)  10 MG tablet Take 1 tablet (10 mg total) by mouth daily. 90 tablet 3   VENTOLIN HFA 108 (90 Base) MCG/ACT inhaler   0   XIIDRA 5 % SOLN Apply to eye 2 (two) times daily.     No current facility-administered medications for this visit.    Allergies  Allergen Reactions   Metoprolol     Irritates asthma   Macrodantin [Nitrofurantoin] Hives   Neosporin [Neomycin-Bacitracin Zn-Polymyx] Rash    Diagnoses:  Adjustment Disorder with mixed anxiety and depressed mood  Plan of Care: Kelly Myers is a 68 y.o MWF who presents with symptoms of anxiety and depression which apear to be a direct result of recent discoveries related to her husband's use of drugs.  She admits that he has had a long history of various addictions, but never anticipated this particular problem.  It is evident that Kelly Myers would benefit from a weekly individual psychotherapy which would focus on providing support around issues related to regaining trust, setting limits, coping with her husband's addiction problems, confronting her denial, as well as, other dynamics inherent to the addiction process.  Treatment would work to build positive coping strategies and skills which would allow patient to manage mood states more effectively. Psychotherapy will also focus on  connecting Kelly Myers to outside support services programmed to assist partners  of addicts.  Therapist will monitor the need for medication and follow up with PCP if medication is deemed necessary.   Hilma Favors, PhD

## 2023-05-06 ENCOUNTER — Ambulatory Visit (INDEPENDENT_AMBULATORY_CARE_PROVIDER_SITE_OTHER): Payer: PPO | Admitting: Psychology

## 2023-05-06 DIAGNOSIS — F4323 Adjustment disorder with mixed anxiety and depressed mood: Secondary | ICD-10-CM | POA: Diagnosis not present

## 2023-05-06 NOTE — Progress Notes (Addendum)
Progress Note: Treatment Planning  Name: NEAL OSHEA Date: 05/06/2023 MRN: [id number] DOB: 1956/01/02 PCP: Adrian Prince, MD  Time spent: 10:00 am - 10:58 am  Today I met in person with [name]   for in office face-to-face individual psychotherapy.  Reason for Visit /Presenting Problem: YARET HUSH presents after she discovered that her husband has been using "heavy" drugs (ie., cocaine, Adderall).  Addiction problems have been part of his history (ie., gambling).  She confronted him, asked him to stop, and predictably he wasn't able to.  He also goes through 2 bottles of Vodka a week and often drinks socially.  Lennie has been feeling depressed and anxious.  She has always been a happy, resourceful and resilient.  Background History:  Shelagh and [name] (69) they have been married for 49 years.  They have two daughters [name] (93) and [name] (40).  [name] lives in [place] and is single.  She works for [name].  [name] lives in [place] and is married to a [employer].  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  Sandrika and [name] are both originally from [place].  They both have aging parents. Her parents are divorced.  Mother lives at [facility] and is 69 y.o.  Her father is deceased.  He died when he was 36 and the longest lived [name]'s disease.  [name] is 62 y.o. and lives at home.  His brother is a [name] and lives with their mother and might be a Chartered loss adjuster.   Mental Status Exam: Appearance:   Casual     Behavior:  Appropriate  Motor:  Normal  Speech/Language:   NA  Affect:  Appropriate  Mood:  anxious and depressed  Thought process:  normal  Thought content:    WNL  Sensory/Perceptual disturbances:    WNL  Orientation:  oriented to person, place, and time/date  Attention:  NA  Concentration:  Good  Memory:  WNL  Fund of knowledge:   Good  Insight:    Good  Judgment:   Good  Impulse Control:  Good    Reported Symptoms:  worrying about different things, not being able to control her worrying, difficulties relaxing, difficulties concentrating, disrupted appetite, difficulties getting motivated or enjoy normally pleasurable things, feeling bad about herself  Risk Assessment: Danger to Self:  No Self-injurious Behavior: No Danger to Others: No Duty to Warn:no Physical Aggression / Violence:No  Access to Firearms a concern: No  Gang Involvement:No .  Substance Abuse History: Current substance abuse: No     Past Psychiatric History:   Previous psychological history is significant for n/a Outpatient Providers:  History of Psych Hospitalization: No  Psychological Testing:  n/a    Abuse History:  Victim of: No.,  n/a    Report needed: No. Victim of Neglect:No. Perpetrator of  n/a   Witness / Exposure to Domestic Violence: No   Protective Services Involvement: No  Witness to [employer] Violence:  No   Family History:  Family History  Problem Relation Age of Onset   Hypertension Mother    Hypertension Father    Cancer Father        lung   Hypertension Sister    Heart disease Sister    Heart attack Sister    Heart disease Maternal Grandfather      Medical History/Surgical History: reviewed Past Medical History:  Diagnosis Date   Asthma  Asthma cough- meds control   Atrophic vaginitis    Bleeds easily (HCC)    "have had clotting studies, tests, I'm within normal limits" (02/12/2016)   Endometriosis    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 EYE ALLERGIES   History of blood transfusion 1984   following childbirth.   History of radiation therapy 1963   after adenoid surgery x 2, they "grew back" and she was treated w/ XRT to the throat   Hypertension    Hypothyroidism    Osteopenia 05/2018   T score -1.4   Pneumonia    "as a child" (02/12/2016)   Thyroid nodule    Uterine prolapse     Past Surgical History:  Procedure Laterality Date   ADENOIDECTOMY  1960;  1963   ANTERIOR AND POSTERIOR VAGINAL REPAIR  2000s   BLADDER SUSPENSION  2000s   CESAREAN SECTION  1984   COLONOSCOPY WITH PROPOFOL N/A 11/04/2015   Procedure: COLONOSCOPY WITH PROPOFOL;  Surgeon: Charolett Bumpers, MD;  Location: [facility];  Service: Endoscopy;  Laterality: N/A;   TUBAL LIGATION  1984   VAGINAL HYSTERECTOMY  2000s   w/bladder suspension and A/P repair   WISDOM TOOTH EXTRACTION  1974    Medications: Current Outpatient Medications  Medication Sig Dispense Refill   bisoprolol (ZEBETA) 5 MG tablet Take 1 tablet (5 mg total) by mouth daily. 90 tablet 3   Calcium Carbonate-Vitamin D (CALCIUM + D PO) Take 1 tablet by mouth daily.     Cetirizine HCl 10 MG CAPS Take 10 mg by mouth daily.     Estradiol 10 MCG TABS vaginal tablet Place 1 tablet (10 mcg total) vaginally 3 (three) times a week. 36 tablet 3   fluticasone (FLONASE) 50 MCG/ACT nasal spray Place 2 sprays into both nostrils daily.     glucosamine-chondroitin 500-400 MG tablet Take 1 tablet by mouth daily.      irbesartan (AVAPRO) 150 MG tablet Take 1 tablet (150 mg total) by mouth daily. 90 tablet 3   levothyroxine (SYNTHROID, LEVOTHROID) 100 MCG tablet Take 1 tablet (100 mcg total) by mouth daily before breakfast. 30 tablet 0   montelukast (SINGULAIR) 10 MG tablet Take 10 mg by mouth at bedtime.     Oyster Shell Calcium 500 MG TABS one tab twice daily Oral     Probiotic Product (PROBIOTIC DAILY PO) Take 1 capsule by mouth daily.      rosuvastatin (CRESTOR) 10 MG tablet Take 1 tablet (10 mg total) by mouth daily. 90 tablet 3   VENTOLIN HFA 108 (90 Base) MCG/ACT inhaler   0   XIIDRA 5 % SOLN Apply to eye 2 (two) times daily.     No current facility-administered medications for this visit.    Allergies  Allergen Reactions   Metoprolol     Irritates asthma   Macrodantin [Nitrofurantoin] Hives   Neosporin [Neomycin-Bacitracin Zn-Polymyx] Rash     Individualized Treatment Plan        Strengths: strong family  connections, helpful, caring, optimistic  Supports: adult children, sister   Goal/Needs for Treatment:  In order of importance to patient 1) Learn and implement skills and strategies to manage anxiety 2) Learn and implement skills and strategies to manage depression 3) Understand the impact her family role has had in her life, and learn how to separate and pursue her own interests/goals 4) Learn how to be more assertive (ie., set limits and boundaries) in a range of settings and experiences   Client  Statement of Needs: Learn life skills that she feels she doesn't have, learning to be more open, honest and rely on other people, learn to be more her own person with her own friends and interests,    Treatment Level: Weekly Outpatient Individual Psychotherapy  Symptoms: anxious, can't stop worrying, "not able to let things go," distracted, difficulty sleeping, depressed,   Client Treatment Preferences:  Referred by Dr. Dellia Cloud   Healthcare consumer's goal for treatment:  Psychologist, Hilma Favors, Ph.D  will support the patient's ability to achieve the goals identified. Cognitive Behavioral Therapy, Dialectical Behavioral Therapy, Motivational Interviewing, Behavioral Motivation, and other evidenced-based practices will be used to promote progress towards healthy functioning.   Healthcare consumer [name]  will: Actively participate in therapy, working towards healthy functioning.    *Justification for Continuation/Discontinuation of Goal: R=Revised, O=Ongoing, A=Achieved, D=Discontinued  Goal 1) Learn and implement skills and strategies to manage anxiety  Likert rating baseline date: 05/06/23 Target Date Goal Was reviewed Status Code Progress towards goal/Likert rating  05/05/2024           O              Goal 2) Learn and implement skills and strategies to manage depression  Likert rating baseline date: 05/06/23 Target Date Goal Was reviewed Status Code Progress towards  goal/Likert rating  05/05/2024           O              Goal 3) Understand the impact her family role has had in her life, and learn how to separate and pursue her own interests/goals  Likert rating baseline date: 05/06/23 Target Date Goal Was reviewed Status Code Progress towards goal/Likert rating  05/05/2024           O              Goal 4) Learn how to be more assertive (ie., set limits and boundaries) in a range of settings and experiences  5 Point Likert rating baseline date: 05/06/23 Target Date Goal Was reviewed Status Code Progress towards goal/Likert rating  05/05/2024            O                This plan has been reviewed and created by the following participants:  This plan will be reviewed at least every 12 months. Date Behavioral Health Clinician Date Guardian/Patient   05/06/2023 Hilma Favors, Ph.D.  05/06/2023 Wilford Sports. Kjos                   Diagnoses:  Adjustment disorder with mixed anxiety and depressed mood   In session today, we created Cherylann Ratel' treatment plan.  We d/ her needs and set goals.  Cidney actively participated in the creation of her treatment plan and freely gave her consent.  Kieara reports that it was a calmer week at home.  But the state of the nation is very upsetting.    Hilma Favors, PhD   Husband: [name] Sister: Bonita Quin Brother-in-law: Raiford Noble

## 2023-05-06 NOTE — Addendum Note (Signed)
Addended by: Marin Olp ANN on: 05/06/2023 05:32 PM   Modules accepted: Level of Service

## 2023-05-13 ENCOUNTER — Ambulatory Visit (INDEPENDENT_AMBULATORY_CARE_PROVIDER_SITE_OTHER): Payer: PPO | Admitting: Psychology

## 2023-05-13 DIAGNOSIS — F4323 Adjustment disorder with mixed anxiety and depressed mood: Secondary | ICD-10-CM | POA: Diagnosis not present

## 2023-05-13 NOTE — Progress Notes (Signed)
Progress Note:   Name: Kelly Myers Date: 05/13/2023 MRN: [id number] DOB: 05-07-1955 PCP: Adrian Prince, MD  Time spent: 12:00 PM - 12:58 PM  Today I met in person with Kelly Myers   for in office face-to-face individual psychotherapy.  Reason for Visit /Presenting Problem: Kelly Myers presents after she discovered that her husband has been using "heavy" drugs (ie., cocaine, Adderall).  Addiction problems have been part of his history (ie., gambling).  She confronted him, asked him to stop, and predictably he wasn't able to.  He also goes through 2 bottles of Vodka a week and often drinks socially.  Kelly Myers has been feeling depressed and anxious.  She has always been a happy, resourceful and resilient person.  Background History:  Kelly Myers and Kelly Myers (72) they have been married for 49 years.  They have two daughters Kelly Myers (31) and Kelly Myers (40).  Kelly Myers lives in [place] and is single.  She works for [name].  Kelly Myers lives in [place] and is married to a [employer].  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  Joretta and Kelly Myers are both originally from [place].  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 They both have aging parents. Her parents are divorced.  Mother lives at [facility] and is 44 y.o.  Her father is deceased.  He died when he was 75 and the longest lived [name]  [name] is 98 y.o. and lives at home.  His brother is a [name] and lives with their mother and might be a Chartered loss adjuster.   Mental Status Exam: Appearance:   Casual     Behavior:  Appropriate  Motor:  Normal  Speech/Language:   NA  Affect:  Appropriate  Mood:  anxious and depressed  Thought process:  normal  Thought content:    WNL  Sensory/Perceptual disturbances:    WNL  Orientation:   oriented to person, place, and time/date  Attention:  NA  Concentration:  Good  Memory:  WNL  Fund of knowledge:   Good  Insight:    Good  Judgment:   Good  Impulse Control:  Good   Reported Symptoms:  worrying about different things, not being able to control her worrying, difficulties relaxing, difficulties concentrating, disrupted appetite, difficulties getting motivated or enjoy normally pleasurable things, feeling bad about herself  Risk Assessment: Danger to Self:  No Self-injurious Behavior: No Danger to Others: No Duty to Warn:no Physical Aggression / Violence:No  Access to Firearms a concern: No  Gang Involvement:No .  Substance Abuse History: Current substance abuse: No     Past Psychiatric History:   Previous psychological history is significant for n/a Outpatient Providers:  History of Psych Hospitalization: No  Psychological Testing:  n/a    Abuse History:  Victim of: No.,  n/a    Report needed: No. Victim of Neglect:No. Perpetrator of  n/a   Witness / Exposure to Domestic Violence: No   Protective Services Involvement: No  Witness to [employer] Violence:  No   Family History:  Family History  Problem Relation Age of Onset   Hypertension Mother    Hypertension Father    Cancer Father        lung   Hypertension Sister    Heart disease Sister  Heart attack Sister    Heart disease Maternal Grandfather      Medical History/Surgical History: reviewed Past Medical History:  Diagnosis Date   Asthma    Asthma cough- meds control   Atrophic vaginitis    Bleeds easily (HCC)    "have had clotting studies, tests, I'm within normal limits" (02/12/2016)   Endometriosis    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 EYE ALLERGIES   History of blood transfusion 1984   following childbirth.   History of radiation therapy 1963   after adenoid surgery x 2, they "grew back" and she was treated w/ XRT to the throat   Hypertension    Hypothyroidism    Osteopenia  05/2018   T score -1.4   Pneumonia    "as a child" (02/12/2016)   Thyroid nodule    Uterine prolapse     Past Surgical History:  Procedure Laterality Date   ADENOIDECTOMY  1960; 1963   ANTERIOR AND POSTERIOR VAGINAL REPAIR  2000s   BLADDER SUSPENSION  2000s   CESAREAN SECTION  1984   COLONOSCOPY WITH PROPOFOL N/A 11/04/2015   Procedure: COLONOSCOPY WITH PROPOFOL;  Surgeon: Charolett Bumpers, MD;  Location: [facility];  Service: Endoscopy;  Laterality: N/A;   TUBAL LIGATION  1984   VAGINAL HYSTERECTOMY  2000s   w/bladder suspension and A/P repair   WISDOM TOOTH EXTRACTION  1974    Medications: Current Outpatient Medications  Medication Sig Dispense Refill   bisoprolol (ZEBETA) 5 MG tablet Take 1 tablet (5 mg total) by mouth daily. 90 tablet 3   Calcium Carbonate-Vitamin D (CALCIUM + D PO) Take 1 tablet by mouth daily.     Cetirizine HCl 10 MG CAPS Take 10 mg by mouth daily.     Estradiol 10 MCG TABS vaginal tablet Place 1 tablet (10 mcg total) vaginally 3 (three) times a week. 36 tablet 3   fluticasone (FLONASE) 50 MCG/ACT nasal spray Place 2 sprays into both nostrils daily.     glucosamine-chondroitin 500-400 MG tablet Take 1 tablet by mouth daily.      irbesartan (AVAPRO) 150 MG tablet Take 1 tablet (150 mg total) by mouth daily. 90 tablet 3   levothyroxine (SYNTHROID, LEVOTHROID) 100 MCG tablet Take 1 tablet (100 mcg total) by mouth daily before breakfast. 30 tablet 0   montelukast (SINGULAIR) 10 MG tablet Take 10 mg by mouth at bedtime.     Oyster Shell Calcium 500 MG TABS one tab twice daily Oral     Probiotic Product (PROBIOTIC DAILY PO) Take 1 capsule by mouth daily.      rosuvastatin (CRESTOR) 10 MG tablet Take 1 tablet (10 mg total) by mouth daily. 90 tablet 3   VENTOLIN HFA 108 (90 Base) MCG/ACT inhaler   0   XIIDRA 5 % SOLN Apply to eye 2 (two) times daily.     No current facility-administered medications for this visit.    Allergies  Allergen Reactions    Metoprolol     Irritates asthma   Macrodantin [Nitrofurantoin] Hives   Neosporin [Neomycin-Bacitracin Zn-Polymyx] Rash     Individualized Treatment Plan        Strengths: strong family connections, helpful, caring, optimistic  Supports: adult children, sister   Goal/Needs for Treatment:  In order of importance to patient 1) Learn and implement skills and strategies to manage anxiety 2) Learn and implement skills and strategies to manage depression 3) Understand the impact her family role has had in her life, and  learn how to separate and pursue her own interests/goals 4) Learn how to be more assertive (ie., set limits and boundaries) in a range of settings and experiences   Client Statement of Needs: Learn life skills that she feels she doesn't have, learning to be more open, honest and rely on other people, learn to be more her own person with her own friends and interests,    Treatment Level: Weekly Outpatient Individual Psychotherapy  Symptoms: anxious, can't stop worrying, "not able to let things go," distracted, difficulty sleeping, depressed,   Client Treatment Preferences:  Referred by Dr. Dellia Cloud   Healthcare consumer's goal for treatment:  Psychologist, Hilma Favors, Ph.D  will support the patient's ability to achieve the goals identified. Cognitive Behavioral Therapy, Dialectical Behavioral Therapy, Motivational Interviewing, Behavioral Motivation, and other evidenced-based practices will be used to promote progress towards healthy functioning.   Healthcare consumer Kelly Myers  will: Actively participate in therapy, working towards healthy functioning.    *Justification for Continuation/Discontinuation of Goal: R=Revised, O=Ongoing, A=Achieved, D=Discontinued  Goal 1) Learn and implement skills and strategies to manage anxiety  Likert rating baseline date: 05/06/23 Target Date Goal Was reviewed Status Code Progress towards goal/Likert rating  05/05/2024            O              Goal 2) Learn and implement skills and strategies to manage depression  Likert rating baseline date: 05/06/23 Target Date Goal Was reviewed Status Code Progress towards goal/Likert rating  05/05/2024           O              Goal 3) Understand the impact her family role has had in her life, and learn how to separate and pursue her own interests/goals  Likert rating baseline date: 05/06/23 Target Date Goal Was reviewed Status Code Progress towards goal/Likert rating  05/05/2024           O              Goal 4) Learn how to be more assertive (ie., set limits and boundaries) in a range of settings and experiences  5 Point Likert rating baseline date: 05/06/23 Target Date Goal Was reviewed Status Code Progress towards goal/Likert rating  05/05/2024            O                This plan has been reviewed and created by the following participants:  This plan will be reviewed at least every 12 months. Date Behavioral Health Clinician Date Guardian/Patient   05/06/2023 Hilma Favors, Ph.D.  05/06/2023 Wilford Sports. [employer]                   Diagnoses:  Adjustment Disorder with mixed anxious and depressed mood   Keiana reports that when she got home after our last session, her husband asked what she talked about.  We d/e/p what she chose to share, what she chose not to, and how things played out.  Isha shared that she tried out some new behavior as result of our last session together.  She states that her husband was supportive, and that she is now beginning to recognize her own anxiety as different than that of her sister.     Hilma Favors, PhD   Husband: Kelly Myers Sister: Bonita Quin Brother-in-law: Raiford Noble

## 2023-05-20 ENCOUNTER — Ambulatory Visit (INDEPENDENT_AMBULATORY_CARE_PROVIDER_SITE_OTHER): Payer: Self-pay | Admitting: Psychology

## 2023-05-20 DIAGNOSIS — F4323 Adjustment disorder with mixed anxiety and depressed mood: Secondary | ICD-10-CM | POA: Diagnosis not present

## 2023-05-20 NOTE — Progress Notes (Signed)
 Progress Note:   Name: Kelly Myers Date: 05/20/2023 MRN: [id number] DOB: 22-May-1955 PCP: [name], MD  Time spent: 10:00 AM - 10:58 AM  Today I met in person with Kelly Myers   for in office face-to-face individual psychotherapy.  Reason for Visit /Presenting Problem: Kelly Myers presents after she discovered that her husband has been using heavy drugs (ie., cocaine, Adderall).  Addiction problems have been part of his history (ie., gambling).  She confronted him, asked him to stop, and predictably he wasn't able to.  He also goes through 2 bottles of Vodka a week and often drinks socially.  Kelly Myers has been feeling depressed and anxious.  She has always been a happy, resourceful and resilient person.  Background History:  Kelly Myers and Kelly Myers (72) they have been married for 49 years.  They have two daughters Kelly Myers (28) and Kelly Myers (40).  Kelly Myers lives in [place] and is single.  She works for [name].  Kelly Myers lives in [place] and is married to a [employer].  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  Kelly Myers and Kelly Myers are both originally from [place].  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 They both have aging parents. Her parents are divorced.  Mother lives at [facility] and is 30 y.o.  Her father is deceased.  He died when he was 32 and the longest lived Kelly Myers's disease pt.  Kelly Myers's mother is 50 y.o. and lives at home.  His brother is a cytogeneticist and lives with their mother and might be a chartered loss adjuster.   Mental Status Exam: Appearance:   Casual     Behavior:  Appropriate  Motor:  Normal  Speech/Language:   NA  Affect:  Appropriate  Mood:  anxious and depressed  Thought process:  normal  Thought content:    WNL  Sensory/Perceptual disturbances:    WNL  Orientation:   oriented to person, place, and time/date  Attention:  NA  Concentration:  Good  Memory:  WNL  Fund of knowledge:   Good  Insight:    Good  Judgment:   Good  Impulse Control:  Good   Reported Symptoms:  worrying about different things, not being able to control her worrying, difficulties relaxing, difficulties concentrating, disrupted appetite, difficulties getting motivated or enjoy normally pleasurable things, feeling bad about herself  Risk Assessment: Danger to Self:  No Self-injurious Behavior: No Danger to Others: No Duty to Warn:no Physical Aggression / Violence:No  Access to Firearms a concern: No  Gang Involvement:No .  Substance Abuse History: Current substance abuse: No     Past Psychiatric History:   Previous psychological history is significant for n/a Outpatient Providers:  History of Psych Hospitalization: No  Psychological Testing:  n/a    Abuse History:  Victim of: No.,  n/a    Report needed: No. Victim of Neglect:No. Perpetrator of  n/a   Witness / Exposure to Domestic Violence: No   Protective Services Involvement: No  Witness to Kelly Myers Violence:  No   Family History:  Family History  Problem Relation Age of Onset   Hypertension Mother    Hypertension Father    Cancer Father        lung   Hypertension Sister    Heart disease  Sister    Heart attack Sister    Heart disease Maternal Grandfather      Medical History/Surgical History: reviewed Past Medical History:  Diagnosis Date   Asthma    Asthma cough- meds control   Atrophic vaginitis    Bleeds easily (HCC)    have had clotting studies, tests, I'm within normal limits (02/12/2016)   Endometriosis    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 EYE ALLERGIES   History of blood transfusion 1984   following childbirth.   History of radiation therapy 1963   after adenoid surgery x 2, they grew back and she was treated w/ XRT to the throat   Hypertension    Hypothyroidism    Osteopenia  05/2018   T score -1.4   Pneumonia    as a child (02/12/2016)   Thyroid  nodule    Uterine prolapse     Past Surgical History:  Procedure Laterality Date   ADENOIDECTOMY  1960; 1963   ANTERIOR AND POSTERIOR VAGINAL REPAIR  2000s   BLADDER SUSPENSION  2000s   CESAREAN SECTION  1984   COLONOSCOPY WITH PROPOFOL  N/A 11/04/2015   Procedure: COLONOSCOPY WITH PROPOFOL ;  Surgeon: Gladis MARLA Louder, MD;  Location: [facility];  Service: Endoscopy;  Laterality: N/A;   TUBAL LIGATION  1984   VAGINAL HYSTERECTOMY  2000s   w/bladder suspension and A/P repair   WISDOM TOOTH EXTRACTION  1974    Medications: Current Outpatient Medications  Medication Sig Dispense Refill   bisoprolol  (ZEBETA ) 5 MG tablet Take 1 tablet (5 mg total) by mouth daily. 90 tablet 3   Calcium  Carbonate-Vitamin D  (CALCIUM  + D PO) Take 1 tablet by mouth daily.     Cetirizine HCl 10 MG CAPS Take 10 mg by mouth daily.     Estradiol  10 MCG TABS vaginal tablet Place 1 tablet (10 mcg total) vaginally 3 (three) times a week. 36 tablet 3   fluticasone  (FLONASE ) 50 MCG/ACT nasal spray Place 2 sprays into both nostrils daily.     glucosamine-chondroitin 500-400 MG tablet Take 1 tablet by mouth daily.      irbesartan  (AVAPRO ) 150 MG tablet Take 1 tablet (150 mg total) by mouth daily. 90 tablet 3   levothyroxine  (SYNTHROID , LEVOTHROID) 100 MCG tablet Take 1 tablet (100 mcg total) by mouth daily before breakfast. 30 tablet 0   montelukast  (SINGULAIR ) 10 MG tablet Take 10 mg by mouth at bedtime.     Oyster Shell Calcium  500 MG TABS one tab twice daily Oral     Probiotic Product (PROBIOTIC DAILY PO) Take 1 capsule by mouth daily.      rosuvastatin  (CRESTOR ) 10 MG tablet Take 1 tablet (10 mg total) by mouth daily. 90 tablet 3   VENTOLIN HFA 108 (90 Base) MCG/ACT inhaler   0   XIIDRA 5 % SOLN Apply to eye 2 (two) times daily.     No current facility-administered medications for this visit.    Allergies  Allergen Reactions    Metoprolol      Irritates asthma   Macrodantin [Nitrofurantoin] Hives   Neosporin [Neomycin-Bacitracin Zn-Polymyx] Rash     Individualized Treatment Plan        Strengths: strong family connections, helpful, caring, optimistic  Supports: adult children, sister   Goal/Needs for Treatment:  In order of importance to patient 1) Learn and implement skills and strategies to manage anxiety 2) Learn and implement skills and strategies to manage depression 3) Understand the impact her family role has had  in her life, and learn how to separate and pursue her own interests/goals 4) Learn how to be more assertive (ie., set limits and boundaries) in a range of settings and experiences   Client Statement of Needs: Learn life skills that she feels she doesn't have, learning to be more open, honest and rely on other people, learn to be more her own person with her own friends and interests,    Treatment Level: Weekly Outpatient Individual Psychotherapy  Symptoms: anxious, can't stop worrying, not able to let things go, distracted, difficulty sleeping, depressed,   Client Treatment Preferences:  Referred by Dr. Coni   Healthcare consumer's goal for treatment:  Psychologist, Ronal Jenkins Sprung, Ph.D  will support the patient's ability to achieve the goals identified. Cognitive Behavioral Therapy, Dialectical Behavioral Therapy, Motivational Interviewing, Behavioral Motivation, and other evidenced-based practices will be used to promote progress towards healthy functioning.   Healthcare consumer Kelly Myers  will: Actively participate in therapy, working towards healthy functioning.    *Justification for Continuation/Discontinuation of Goal: R=Revised, O=Ongoing, A=Achieved, D=Discontinued  Goal 1) Learn and implement skills and strategies to manage anxiety  Likert rating baseline date: 05/06/23 Target Date Goal Was reviewed Status Code Progress towards goal/Likert rating  05/05/2024            O              Goal 2) Learn and implement skills and strategies to manage depression  Likert rating baseline date: 05/06/23 Target Date Goal Was reviewed Status Code Progress towards goal/Likert rating  05/05/2024           O              Goal 3) Understand the impact her family role has had in her life, and learn how to separate and pursue her own interests/goals  Likert rating baseline date: 05/06/23 Target Date Goal Was reviewed Status Code Progress towards goal/Likert rating  05/05/2024           O              Goal 4) Learn how to be more assertive (ie., set limits and boundaries) in a range of settings and experiences  5 Point Likert rating baseline date: 05/06/23 Target Date Goal Was reviewed Status Code Progress towards goal/Likert rating  05/05/2024            O                This plan has been reviewed and created by the following participants:  This plan will be reviewed at least every 12 months. Date Behavioral Health Clinician Date Guardian/Patient   05/06/2023 Ronal Jenkins Sprung, Ph.D.  05/06/2023 Kelly Myers                   Diagnoses:  Adjustment Disorder with mixed anxious and depressed mood   Jennesis reports that her 8 y.o. mother is sick with the flu.  We d/p her anxiety about her mother's health, concerns about her not being able to make the family vacation, and staying healthy until they leave on their trip.  We also d/e/p the family dynamics of vacationing with extended family, the highs and the lows of this particular annual trip.     Lastly, Tiffani shared that her sister asked her to be in charge of her daughter's trust (as set up by their father).  We d/ how she felt about taking on this responsibility, how it related to  her role in family dynamics, and how to navigate the burden while prioritizing her mental health.  We agreed that the family trip would be a good time to try out some new behaviors.  Ronal Jenkins Sprung, PhD   Husband: Kelly Myers Sister:  Rock Brother-in-law: Dick

## 2023-05-24 ENCOUNTER — Other Ambulatory Visit: Payer: Self-pay | Admitting: Nurse Practitioner

## 2023-05-24 DIAGNOSIS — Z1231 Encounter for screening mammogram for malignant neoplasm of breast: Secondary | ICD-10-CM

## 2023-05-27 ENCOUNTER — Ambulatory Visit: Payer: PPO | Admitting: Psychology

## 2023-05-27 DIAGNOSIS — F4323 Adjustment disorder with mixed anxiety and depressed mood: Secondary | ICD-10-CM | POA: Diagnosis not present

## 2023-05-27 NOTE — Addendum Note (Signed)
Addended by: Marin Olp ANN on: 05/27/2023 02:10 PM   Modules accepted: Level of Service

## 2023-05-27 NOTE — Progress Notes (Signed)
Progress Note:   Name: Kelly Myers Date: 05/27/2023 MRN: [id number] DOB: 1955/08/09 PCP: Adrian Prince, MD  Time spent: 12:00 PM - 12:58 PM  Today I met in person with Kelly Myers   for in office face-to-face individual psychotherapy.  Reason for Visit /Presenting Problem: [name] presents after she discovered that her husband has been using "heavy" drugs (ie., cocaine, Adderall).  Addiction problems have been part of his history (ie., gambling).  She confronted him, asked him to stop, and predictably he wasn't able to.  He also goes through 2 bottles of Vodka a week and often drinks socially.  Brentney has been feeling depressed and anxious.  She has always been a happy, resourceful and resilient person.  Background History:  Kelly and Kelly Myers (71) they have been married for 49 years.  They have two daughters Kelly Myers (74) and Kelly Myers (40).  Kelly Myers lives in [place] and is single.  She works for [name].  Kelly Myers lives in [place] and is married to a [employer].  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  Kelly Myers and Kelly Myers are both originally from [place].  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 They both have aging parents. Her parents are divorced.  Mother lives at [facility] and is 69 y.o.  Her father is deceased.  He died when he was 62 and the longest lived [name]  Kelly Myers's mother is 29 y.o. and lives at home.  His brother is a [name] and lives with their mother and might be a Chartered loss adjuster.   Mental Status Exam: Appearance:   Casual     Behavior:  Appropriate  Motor:  Normal  Speech/Language:   NA  Affect:  Appropriate  Mood:  anxious and depressed  Thought process:  normal  Thought content:    WNL  Sensory/Perceptual disturbances:    WNL  Orientation:   oriented to person, place, and time/date  Attention:  NA  Concentration:  Good  Memory:  WNL  Fund of knowledge:   Good  Insight:    Good  Judgment:   Good  Impulse Control:  Good   Reported Symptoms:  worrying about different things, not being able to control her worrying, difficulties relaxing, difficulties concentrating, disrupted appetite, difficulties getting motivated or enjoy normally pleasurable things, feeling bad about herself  Risk Assessment: Danger to Self:  No Self-injurious Behavior: No Danger to Others: No Duty to Warn:no Physical Aggression / Violence:No  Access to Firearms a concern: No  Gang Involvement:No .  Substance Abuse History: Current substance abuse: No     Past Psychiatric History:   Previous psychological history is significant for n/a Outpatient Providers:  History of Psych Hospitalization: No  Psychological Testing:  n/a    Abuse History:  Victim of: No.,  n/a    Report needed: No. Victim of Neglect:No. Perpetrator of  n/a   Witness / Exposure to Domestic Violence: No   Protective Services Involvement: No  Witness to [employer] Violence:  No   Family History:  Family History  Problem Relation Age of Onset   Hypertension Mother    Hypertension Father    Cancer Father        lung   Hypertension Sister    Heart disease  Sister    Heart attack Sister    Heart disease Maternal Grandfather      Medical History/Surgical History: reviewed Past Medical History:  Diagnosis Date   Asthma    Asthma cough- meds control   Atrophic vaginitis    Bleeds easily (HCC)    "have had clotting studies, tests, I'm within normal limits" (02/12/2016)   Endometriosis    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 EYE ALLERGIES   History of blood transfusion 1984   following childbirth.   History of radiation therapy 1963   after adenoid surgery x 2, they "grew back" and she was treated w/ XRT to the throat   Hypertension    Hypothyroidism    Osteopenia  05/2018   T score -1.4   Pneumonia    "as a child" (02/12/2016)   Thyroid nodule    Uterine prolapse     Past Surgical History:  Procedure Laterality Date   ADENOIDECTOMY  1960; 1963   ANTERIOR AND POSTERIOR VAGINAL REPAIR  2000s   BLADDER SUSPENSION  2000s   CESAREAN SECTION  1984   COLONOSCOPY WITH PROPOFOL N/A 11/04/2015   Procedure: COLONOSCOPY WITH PROPOFOL;  Surgeon: Charolett Bumpers, MD;  Location: [facility];  Service: Endoscopy;  Laterality: N/A;   TUBAL LIGATION  1984   VAGINAL HYSTERECTOMY  2000s   w/bladder suspension and A/P repair   WISDOM TOOTH EXTRACTION  1974    Medications: Current Outpatient Medications  Medication Sig Dispense Refill   bisoprolol (ZEBETA) 5 MG tablet Take 1 tablet (5 mg total) by mouth daily. 90 tablet 3   Calcium Carbonate-Vitamin D (CALCIUM + D PO) Take 1 tablet by mouth daily.     Cetirizine HCl 10 MG CAPS Take 10 mg by mouth daily.     Estradiol 10 MCG TABS vaginal tablet Place 1 tablet (10 mcg total) vaginally 3 (three) times a week. 36 tablet 3   fluticasone (FLONASE) 50 MCG/ACT nasal spray Place 2 sprays into both nostrils daily.     glucosamine-chondroitin 500-400 MG tablet Take 1 tablet by mouth daily.      irbesartan (AVAPRO) 150 MG tablet Take 1 tablet (150 mg total) by mouth daily. 90 tablet 3   levothyroxine (SYNTHROID, LEVOTHROID) 100 MCG tablet Take 1 tablet (100 mcg total) by mouth daily before breakfast. 30 tablet 0   montelukast (SINGULAIR) 10 MG tablet Take 10 mg by mouth at bedtime.     Oyster Shell Calcium 500 MG TABS one tab twice daily Oral     Probiotic Product (PROBIOTIC DAILY PO) Take 1 capsule by mouth daily.      rosuvastatin (CRESTOR) 10 MG tablet Take 1 tablet (10 mg total) by mouth daily. 90 tablet 3   VENTOLIN HFA 108 (90 Base) MCG/ACT inhaler   0   XIIDRA 5 % SOLN Apply to eye 2 (two) times daily.     No current facility-administered medications for this visit.    Allergies  Allergen Reactions    Metoprolol     Irritates asthma   Macrodantin [Nitrofurantoin] Hives   Neosporin [Neomycin-Bacitracin Zn-Polymyx] Rash     Individualized Treatment Plan        Strengths: strong family connections, helpful, caring, optimistic  Supports: adult children, sister   Goal/Needs for Treatment:  In order of importance to patient 1) Learn and implement skills and strategies to manage anxiety 2) Learn and implement skills and strategies to manage depression 3) Understand the impact her family role has had  in her life, and learn how to separate and pursue her own interests/goals 4) Learn how to be more assertive (ie., set limits and boundaries) in a range of settings and experiences   Client Statement of Needs: Learn life skills that she feels she doesn't have, learning to be more open, honest and rely on other people, learn to be more her own person with her own friends and interests,    Treatment Level: Weekly Outpatient Individual Psychotherapy  Symptoms: anxious, can't stop worrying, "not able to let things go," distracted, difficulty sleeping, depressed,   Client Treatment Preferences:  Referred by Dr. Dellia Cloud   Healthcare consumer's goal for treatment:  Psychologist, Hilma Favors, Ph.D  will support the patient's ability to achieve the goals identified. Cognitive Behavioral Therapy, Dialectical Behavioral Therapy, Motivational Interviewing, Behavioral Motivation, and other evidenced-based practices will be used to promote progress towards healthy functioning.   Healthcare consumer Kelly Myers  will: Actively participate in therapy, working towards healthy functioning.    *Justification for Continuation/Discontinuation of Goal: R=Revised, O=Ongoing, A=Achieved, D=Discontinued  Goal 1) Learn and implement skills and strategies to manage anxiety  Likert rating baseline date: 05/06/23 Target Date Goal Was reviewed Status Code Progress towards goal/Likert rating  05/05/2024            O              Goal 2) Learn and implement skills and strategies to manage depression  Likert rating baseline date: 05/06/23 Target Date Goal Was reviewed Status Code Progress towards goal/Likert rating  05/05/2024           O              Goal 3) Understand the impact her family role has had in her life, and learn how to separate and pursue her own interests/goals  Likert rating baseline date: 05/06/23 Target Date Goal Was reviewed Status Code Progress towards goal/Likert rating  05/05/2024           O              Goal 4) Learn how to be more assertive (ie., set limits and boundaries) in a range of settings and experiences  5 Point Likert rating baseline date: 05/06/23 Target Date Goal Was reviewed Status Code Progress towards goal/Likert rating  05/05/2024            O                This plan has been reviewed and created by the following participants:  This plan will be reviewed at least every 12 months. Date Behavioral Health Clinician Date Guardian/Patient   05/06/2023 Hilma Favors, Ph.D.  05/06/2023 Wilford Sports. [place]                   Diagnoses:  Adjustment Disorder with mixed anxious and depressed mood   Vertis reports that her 11 y.o. mother was sick with the flu, worsened and then ended up in the hospital.  We d/p her anxiety about her mother's health, concerns, deciding whether or not to go on their annual vacation.  Aliegha states that she is aware that she struggles with being still, moving slowly.  We d/p this in terms of feeling restless if she isn't doing something.  I provided psychoeducation about anxiety, on  how it impact the body on a biochemical level, and techniques to lower her anxiety.  We then d/ ways to reframe slower activities as productive and  necessary, as well as incorporating slow movement into her daily routine (Somatic Yoga).  I highly recommended that she do some slow movement before bedtime to prepare for more restful sleep.  Hilma Favors, PhD    Husband: Kelly Myers Sister: Bonita Quin Brother-in-law: Raiford Noble

## 2023-06-08 ENCOUNTER — Ambulatory Visit: Payer: Self-pay

## 2023-06-10 ENCOUNTER — Ambulatory Visit: Payer: PPO | Admitting: Psychology

## 2023-06-10 DIAGNOSIS — F4323 Adjustment disorder with mixed anxiety and depressed mood: Secondary | ICD-10-CM | POA: Diagnosis not present

## 2023-06-10 NOTE — Progress Notes (Signed)
 Progress Note:   Name: Kelly Myers Date: 06/10/2023 MRN: [id number] DOB: 1955/09/09 PCP: Adrian Prince, MD  Time spent: 12:00 PM - 12:59 PM  Today I met in person with Kelly Myers  for in office face-to-face individual psychotherapy.  Reason for Visit /Presenting Problem: Kelly Myers presents after she discovered that her husband has been using "heavy" drugs (ie., cocaine, Adderall).  Addiction problems have been part of his history (ie., gambling).  She confronted him, asked him to stop, and predictably he wasn't able to.  He also goes through 2 bottles of Vodka a week and often drinks socially.  Kelly Myers has been feeling depressed and anxious.  She has always been a happy, resourceful and resilient person.  Background History:  Kelly Myers and Kelly Myers (72) they have been married for 49 years.  They have two daughters Kelly Myers (75) and Kelly Myers (40).  Kelly Myers lives in [place] and is single.  She works for [name].  Kelly Myers lives in [place] and is married to a [employer].  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  [name] and Kelly Myers are both originally from [place].  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 They both have aging parents. Her parents are divorced.  Mother lives at [facility] and is 10 y.o.  Her father is deceased.  He died when he was 19 and the longest lived [name]  [name] is 1 y.o. and lives at home.  His brother is a [name] and lives with their mother and might be a Chartered loss adjuster.   Mental Status Exam: Appearance:   Casual     Behavior:  Appropriate  Motor:  Normal  Speech/Language:   NA  Affect:  Appropriate  Mood:  anxious and depressed  Thought process:  normal  Thought content:    WNL  Sensory/Perceptual disturbances:    WNL  Orientation:   oriented to person, place, and time/date  Attention:  NA  Concentration:  Good  Memory:  WNL  Fund of knowledge:   Good  Insight:    Good  Judgment:   Good  Impulse Control:  Good   Reported Symptoms:  worrying about different things, not being able to control her worrying, difficulties relaxing, difficulties concentrating, disrupted appetite, difficulties getting motivated or enjoy normally pleasurable things, feeling bad about herself  Risk Assessment: Danger to Self:  No Self-injurious Behavior: No Danger to Others: No Duty to Warn:no Physical Aggression / Violence:No  Access to Firearms a concern: No  Gang Involvement:No .  Substance Abuse History: Current substance abuse: No     Past Psychiatric History:   Previous psychological history is significant for n/a Outpatient Providers:  History of Psych Hospitalization: No  Psychological Testing:  n/a    Abuse History:  Victim of: No.,  n/a    Report needed: No. Victim of Neglect:No. Perpetrator of  n/a   Witness / Exposure to Domestic Violence: No   Protective Services Involvement: No  Witness to [employer] Violence:  No   Family History:  Family History  Problem Relation Age of Onset   Hypertension Mother    Hypertension Father    Cancer Father        lung   Hypertension Sister    Heart disease Sister  Heart attack Sister    Heart disease Maternal Grandfather      Medical History/Surgical History: reviewed Past Medical History:  Diagnosis Date   Asthma    Asthma cough- meds control   Atrophic vaginitis    Bleeds easily (HCC)    "have had clotting studies, tests, I'm within normal limits" (02/12/2016)   Endometriosis    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 EYE ALLERGIES   History of blood transfusion 1984   following childbirth.   History of radiation therapy 1963   after adenoid surgery x 2, they "grew back" and she was treated w/ XRT to the throat   Hypertension    Hypothyroidism    Osteopenia  05/2018   T score -1.4   Pneumonia    "as a child" (02/12/2016)   Thyroid nodule    Uterine prolapse     Past Surgical History:  Procedure Laterality Date   ADENOIDECTOMY  1960; 1963   ANTERIOR AND POSTERIOR VAGINAL REPAIR  2000s   BLADDER SUSPENSION  2000s   CESAREAN SECTION  1984   COLONOSCOPY WITH PROPOFOL N/A 11/04/2015   Procedure: COLONOSCOPY WITH PROPOFOL;  Surgeon: Charolett Bumpers, MD;  Location: [facility];  Service: Endoscopy;  Laterality: N/A;   TUBAL LIGATION  1984   VAGINAL HYSTERECTOMY  2000s   w/bladder suspension and A/P repair   WISDOM TOOTH EXTRACTION  1974    Medications: Current Outpatient Medications  Medication Sig Dispense Refill   bisoprolol (ZEBETA) 5 MG tablet Take 1 tablet (5 mg total) by mouth daily. 90 tablet 3   Calcium Carbonate-Vitamin D (CALCIUM + D PO) Take 1 tablet by mouth daily.     Cetirizine HCl 10 MG CAPS Take 10 mg by mouth daily.     Estradiol 10 MCG TABS vaginal tablet Place 1 tablet (10 mcg total) vaginally 3 (three) times a week. 36 tablet 3   fluticasone (FLONASE) 50 MCG/ACT nasal spray Place 2 sprays into both nostrils daily.     glucosamine-chondroitin 500-400 MG tablet Take 1 tablet by mouth daily.      irbesartan (AVAPRO) 150 MG tablet Take 1 tablet (150 mg total) by mouth daily. 90 tablet 3   levothyroxine (SYNTHROID, LEVOTHROID) 100 MCG tablet Take 1 tablet (100 mcg total) by mouth daily before breakfast. 30 tablet 0   montelukast (SINGULAIR) 10 MG tablet Take 10 mg by mouth at bedtime.     Oyster Shell Calcium 500 MG TABS one tab twice daily Oral     Probiotic Product (PROBIOTIC DAILY PO) Take 1 capsule by mouth daily.      rosuvastatin (CRESTOR) 10 MG tablet Take 1 tablet (10 mg total) by mouth daily. 90 tablet 3   VENTOLIN HFA 108 (90 Base) MCG/ACT inhaler   0   XIIDRA 5 % SOLN Apply to eye 2 (two) times daily.     No current facility-administered medications for this visit.    Allergies  Allergen Reactions    Metoprolol     Irritates asthma   Macrodantin [Nitrofurantoin] Hives   Neosporin [Neomycin-Bacitracin Zn-Polymyx] Rash     Individualized Treatment Plan        Strengths: strong family connections, helpful, caring, optimistic  Supports: adult children, sister   Goal/Needs for Treatment:  In order of importance to patient 1) Learn and implement skills and strategies to manage anxiety 2) Learn and implement skills and strategies to manage depression 3) Understand the impact her family role has had in her life, and  learn how to separate and pursue her own interests/goals 4) Learn how to be more assertive (ie., set limits and boundaries) in a range of settings and experiences   Client Statement of Needs: Learn life skills that she feels she doesn't have, learning to be more open, honest and rely on other people, learn to be more her own person with her own friends and interests,    Treatment Level: Weekly Outpatient Individual Psychotherapy  Symptoms: anxious, can't stop worrying, "not able to let things go," distracted, difficulty sleeping, depressed,   Client Treatment Preferences:  Referred by Dr. Dellia Cloud   Healthcare consumer's goal for treatment:  Psychologist, Hilma Favors, Ph.D  will support the patient's ability to achieve the goals identified. Cognitive Behavioral Therapy, Dialectical Behavioral Therapy, Motivational Interviewing, Behavioral Motivation, and other evidenced-based practices will be used to promote progress towards healthy functioning.   Healthcare consumer Kelly Myers  will: Actively participate in therapy, working towards healthy functioning.    *Justification for Continuation/Discontinuation of Goal: R=Revised, O=Ongoing, A=Achieved, D=Discontinued  Goal 1) Learn and implement skills and strategies to manage anxiety  Likert rating baseline date: 05/06/23 Target Date Goal Was reviewed Status Code Progress towards goal/Likert rating  05/05/2024            O              Goal 2) Learn and implement skills and strategies to manage depression  Likert rating baseline date: 05/06/23 Target Date Goal Was reviewed Status Code Progress towards goal/Likert rating  05/05/2024           O              Goal 3) Understand the impact her family role has had in her life, and learn how to separate and pursue her own interests/goals  Likert rating baseline date: 05/06/23 Target Date Goal Was reviewed Status Code Progress towards goal/Likert rating  05/05/2024           O              Goal 4) Learn how to be more assertive (ie., set limits and boundaries) in a range of settings and experiences  5 Point Likert rating baseline date: 05/06/23 Target Date Goal Was reviewed Status Code Progress towards goal/Likert rating  05/05/2024            O                This plan has been reviewed and created by the following participants:  This plan will be reviewed at least every 12 months. Date Behavioral Health Clinician Date Guardian/Patient   05/06/2023 Hilma Favors, Ph.D.  05/06/2023 Wilford Sports. [place]                   Diagnoses:  Adjustment Disorder with mixed anxious and depressed mood   [place] reports that her 37 y.o. mother passed away while she was on her way back home from vacation.  We d/p what occurred, saying good bye, working through her feelings of guilt, and related family dynamics.  She was anxious about how she would fill the space that was occupied by calls and visits to her mother that consumed her day.  We d/p her anxious thoughts, and I offered the support and guidance she needed to begin to navigate life without her mother.  Hilma Favors, PhD   Husband: Kelly Myers Sister: Bonita Quin Brother-in-law: Raiford Noble

## 2023-06-24 ENCOUNTER — Ambulatory Visit: Payer: PPO | Admitting: Psychology

## 2023-07-08 ENCOUNTER — Ambulatory Visit: Payer: PPO | Admitting: Psychology

## 2023-07-08 DIAGNOSIS — F4323 Adjustment disorder with mixed anxiety and depressed mood: Secondary | ICD-10-CM | POA: Diagnosis not present

## 2023-07-08 NOTE — Progress Notes (Signed)
 Progress Note:   Name: Kelly Myers Date: 07/08/2023 MRN: [id number] DOB: 10/29/1955 PCP: Adrian Prince, MD  Time spent: 12:00 PM - 12:58 PM  Today I met in person with Kelly Myers  for in office face-to-face individual psychotherapy.  Reason for Visit /Presenting Problem: Kelly Myers presents after she discovered that her husband has been using "heavy" drugs (ie., cocaine, Adderall).  Addiction problems have been part of his history (ie., gambling).  She confronted him, asked him to stop, and predictably he wasn't able to.  He also goes through 2 bottles of Vodka Kelly week and often drinks socially.  Kelly Myers has been feeling depressed and anxious.  She has always been Kelly happy, resourceful and resilient person.  Background History:  Kelly Myers and Kelly Myers (72) they have been married for 49 years.  They have two daughters Kelly Myers (19) and Kelly Myers (40).  Kelly Myers lives in [name] and is single.  She works for [name].  Kelly Myers lives in [name] and is married to Kelly [employer].  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  Kelly Myers and Kelly Myers are both originally from [place].  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 They both have aging parents. Her parents are divorced.  Mother lives at [facility] and is 56 y.o.  Her father is deceased.  He died when he was 79 and the longest lived Kelly Myers.  Kelly Myers's mother is 13 y.o. and lives at home.  His brother is Kelly Myers and lives with their mother and might be Kelly Chartered loss adjuster.   Mental Status Exam: Appearance:   Casual     Behavior:  Appropriate  Motor:  Normal  Speech/Language:   NA  Affect:  Appropriate  Mood:  anxious and depressed  Thought process:  normal  Thought content:    WNL  Sensory/Perceptual disturbances:    WNL  Orientation:   oriented to person, place, and time/date  Attention:  NA  Concentration:  Good  Memory:  WNL  Fund of knowledge:   Good  Insight:    Good  Judgment:   Good  Impulse Control:  Good   Reported Symptoms:  worrying about different things, not being able to control her worrying, difficulties relaxing, difficulties concentrating, disrupted appetite, difficulties getting motivated or enjoy normally pleasurable things, feeling bad about herself  Risk Assessment: Danger to Self:  No Self-injurious Behavior: No Danger to Others: No Duty to Warn:no Physical Aggression / Violence:No  Access to Firearms Kelly concern: No  Gang Involvement:No .  Substance Abuse History: Current substance abuse: No     Past Psychiatric History:   Previous psychological history is significant for n/Kelly Outpatient Providers:  History of Psych Hospitalization: No  Psychological Testing:  n/Kelly    Abuse History:  Victim of: No.,  n/Kelly    Report needed: No. Victim of Neglect:No. Perpetrator of  n/Kelly   Witness / Exposure to Domestic Violence: No   Protective Services Involvement: No  Witness to [employer] Violence:  No   Family History:  Family History  Problem Relation Age of Onset   Hypertension Mother    Hypertension Father    Cancer Father        lung   Hypertension Sister    Heart disease Sister  Heart attack Sister    Heart disease Maternal Grandfather      Medical History/Surgical History: reviewed Past Medical History:  Diagnosis Date   Asthma    Asthma cough- meds control   Atrophic vaginitis    Bleeds easily (HCC)    "have had clotting studies, tests, I'm within normal limits" (02/12/2016)   Endometriosis    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 EYE ALLERGIES   History of blood transfusion 1984   following childbirth.   History of radiation therapy 1963   after adenoid surgery x 2, they "grew back" and she was treated w/ XRT to the throat   Hypertension    Hypothyroidism    Osteopenia  05/2018   T score -1.4   Pneumonia    "as Kelly child" (02/12/2016)   Thyroid nodule    Uterine prolapse     Past Surgical History:  Procedure Laterality Date   ADENOIDECTOMY  1960; 1963   ANTERIOR AND POSTERIOR VAGINAL REPAIR  2000s   BLADDER SUSPENSION  2000s   CESAREAN SECTION  1984   COLONOSCOPY WITH PROPOFOL N/Kelly 11/04/2015   Procedure: COLONOSCOPY WITH PROPOFOL;  Surgeon: Charolett Bumpers, MD;  Location: [facility];  Service: Endoscopy;  Laterality: N/Kelly;   TUBAL LIGATION  1984   VAGINAL HYSTERECTOMY  2000s   w/bladder suspension and Kelly/P repair   WISDOM TOOTH EXTRACTION  1974    Medications: Current Outpatient Medications  Medication Sig Dispense Refill   bisoprolol (ZEBETA) 5 MG tablet Take 1 tablet (5 mg total) by mouth daily. 90 tablet 3   Calcium Carbonate-Vitamin D (CALCIUM + D PO) Take 1 tablet by mouth daily.     Cetirizine HCl 10 MG CAPS Take 10 mg by mouth daily.     Estradiol 10 MCG TABS vaginal tablet Place 1 tablet (10 mcg total) vaginally 3 (three) times Kelly week. 36 tablet 3   fluticasone (FLONASE) 50 MCG/ACT nasal spray Place 2 sprays into both nostrils daily.     glucosamine-chondroitin 500-400 MG tablet Take 1 tablet by mouth daily.      irbesartan (AVAPRO) 150 MG tablet Take 1 tablet (150 mg total) by mouth daily. 90 tablet 3   levothyroxine (SYNTHROID, LEVOTHROID) 100 MCG tablet Take 1 tablet (100 mcg total) by mouth daily before breakfast. 30 tablet 0   montelukast (SINGULAIR) 10 MG tablet Take 10 mg by mouth at bedtime.     Oyster Shell Calcium 500 MG TABS one tab twice daily Oral     Probiotic Product (PROBIOTIC DAILY PO) Take 1 capsule by mouth daily.      rosuvastatin (CRESTOR) 10 MG tablet Take 1 tablet (10 mg total) by mouth daily. 90 tablet 3   VENTOLIN HFA 108 (90 Base) MCG/ACT inhaler   0   XIIDRA 5 % SOLN Apply to eye 2 (two) times daily.     No current facility-administered medications for this visit.    Allergies  Allergen Reactions    Metoprolol     Irritates asthma   Macrodantin [Nitrofurantoin] Hives   Neosporin [Neomycin-Bacitracin Zn-Polymyx] Rash     Individualized Treatment Plan        Strengths: strong family connections, helpful, caring, optimistic  Supports: adult children, sister   Goal/Needs for Treatment:  In order of importance to patient 1) Learn and implement skills and strategies to manage anxiety 2) Learn and implement skills and strategies to manage depression 3) Understand the impact her family role has had in her life, and  learn how to separate and pursue her own interests/goals 4) Learn how to be more assertive (ie., set limits and boundaries) in Kelly range of settings and experiences   Client Statement of Needs: Learn life skills that she feels she doesn't have, learning to be more open, honest and rely on other people, learn to be more her own person with her own friends and interests,    Treatment Level: Weekly Outpatient Individual Psychotherapy  Symptoms: anxious, can't stop worrying, "not able to let things go," distracted, difficulty sleeping, depressed,   Client Treatment Preferences:  Referred by Dr. Dellia Cloud   Healthcare consumer's goal for treatment:  Psychologist, Hilma Favors, Ph.D  will support the patient's ability to achieve the goals identified. Cognitive Behavioral Therapy, Dialectical Behavioral Therapy, Motivational Interviewing, Behavioral Motivation, and other evidenced-based practices will be used to promote progress towards healthy functioning.   Healthcare consumer Kelly Myers  will: Actively participate in therapy, working towards healthy functioning.    *Justification for Continuation/Discontinuation of Goal: R=Revised, O=Ongoing, Kelly=Achieved, D=Discontinued  Goal 1) Learn and implement skills and strategies to manage anxiety  Likert rating baseline date: 05/06/23 Target Date Goal Was reviewed Status Code Progress towards goal/Likert rating  05/05/2024            O              Goal 2) Learn and implement skills and strategies to manage depression  Likert rating baseline date: 05/06/23 Target Date Goal Was reviewed Status Code Progress towards goal/Likert rating  05/05/2024           O              Goal 3) Understand the impact her family role has had in her life, and learn how to separate and pursue her own interests/goals  Likert rating baseline date: 05/06/23 Target Date Goal Was reviewed Status Code Progress towards goal/Likert rating  05/05/2024           O              Goal 4) Learn how to be more assertive (ie., set limits and boundaries) in Kelly range of settings and experiences  5 Point Likert rating baseline date: 05/06/23 Target Date Goal Was reviewed Status Code Progress towards goal/Likert rating  05/05/2024            O                This plan has been reviewed and created by the following participants:  This plan will be reviewed at least every 12 months. Date Behavioral Health Clinician Date Guardian/Patient   05/06/2023 Hilma Favors, Ph.D.  05/06/2023 Wilford Sports. [place]                   Diagnoses:  Adjustment Disorder with mixed anxious and depressed mood   Vesna reports that shortly after her mother passed away, her 67 y.o. mother-in-law's health took Kelly drastic turn.  We d/p managing her anxious thoughts, trying to be supportive of her husband while not taking over.  Levon also states that she made another discovery and was rather frustrated.  We d/e/p how she felt, what she said to her husband, and sharing information with her adult children.  I encouraged her to spend time reflecting on where her limits lay.   Hilma Favors, PhD   Husband: Kelly Myers Sister: Bonita Quin Brother-in-law: Raiford Noble

## 2023-07-12 ENCOUNTER — Encounter: Payer: Self-pay | Admitting: Nurse Practitioner

## 2023-07-12 ENCOUNTER — Ambulatory Visit (INDEPENDENT_AMBULATORY_CARE_PROVIDER_SITE_OTHER): Payer: PPO | Admitting: Nurse Practitioner

## 2023-07-12 VITALS — BP 122/72 | HR 72 | Ht 63.25 in | Wt 133.0 lb

## 2023-07-12 DIAGNOSIS — N951 Menopausal and female climacteric states: Secondary | ICD-10-CM

## 2023-07-12 DIAGNOSIS — Z01419 Encounter for gynecological examination (general) (routine) without abnormal findings: Secondary | ICD-10-CM

## 2023-07-12 DIAGNOSIS — M8589 Other specified disorders of bone density and structure, multiple sites: Secondary | ICD-10-CM

## 2023-07-12 DIAGNOSIS — Z780 Asymptomatic menopausal state: Secondary | ICD-10-CM

## 2023-07-12 MED ORDER — ESTRADIOL 10 MCG VA TABS
1.0000 | ORAL_TABLET | VAGINAL | 3 refills | Status: AC
Start: 2023-07-12 — End: ?

## 2023-07-12 NOTE — Progress Notes (Signed)
 Kelly Myers 06-Apr-1956 [id number]   History:  68 y.o. G2P1102 presents for breast and pelvic exam without GYN complaints. Postmenopausal - stopped ERT last year. Doing fine on Estroven. Using vaginal estrogen cream for dryness and painful intercourse. S/P 2008 TVH with A & P repair. Normal pap and mammogram history. HTN, hypothyroidism managed by PCP.   Gynecologic History No LMP recorded. Patient has had a hysterectomy.   Contraception/Family planning: status post hysterectomy Sexually active: Yes  Health Maintenance Last Pap: 11/17/2010. Results were: Normal Last mammogram: 04/22/2022. Results were: Normal Last colonoscopy: 08/2022 Last Dexa: 08/04/2022. Results were: T-score -1.5, FRAX 9.0% / 1.1%  Past medical history, past surgical history, family history and social history were all reviewed and documented in the EPIC chart. Married. 2 daughters, one in [place], one in [place] for law school. No grandchildren. Retired from Market researcher business.   ROS:  A ROS was performed and pertinent positives and negatives are included.  Exam:  Vitals:   07/12/23 0905  BP: 122/72  Pulse: 72  SpO2: 98%  Weight: 133 lb (60.3 kg)  Height: 5' 3.25" (1.607 m)     Body mass index is 23.37 kg/m.  General appearance:  Normal Thyroid:  Symmetrical, normal in size, without palpable masses or nodularity. Respiratory  Auscultation:  Clear without wheezing or rhonchi Cardiovascular  Auscultation:  Regular rate, without rubs, murmurs or gallops  Edema/varicosities:  Not grossly evident Abdominal  Soft,nontender, without masses, guarding or rebound.  Liver/spleen:  No organomegaly noted  Hernia:  None appreciated  Skin  Inspection:  Grossly normal   Breasts: Examined lying and sitting.   Right: Without masses, retractions, discharge or axillary adenopathy.   Left: Without masses, retractions, discharge or axillary adenopathy. Pelvic: External genitalia:  no lesions               Urethra:  normal appearing urethra with no masses, tenderness or lesions              Bartholins and Skenes: normal                 Vagina: normal appearing vagina with normal color and discharge, no lesions. Atrophic changes              Cervix: absent Bimanual Exam:  Uterus:  absent              Adnexa: no mass, fullness, tenderness              Rectovaginal: Deferred              Anus:  non-bleeding hemorrhoids  Patient informed chaperone available to be present for breast and pelvic exam. Patient has requested no chaperone to be present. Patient has been advised what will be completed during breast and pelvic exam.   Assessment/Plan:  68 y.o. G2P1102 for breast and pelvic exam.   Encounter for breast and pelvic examination - Education provided on SBEs, importance of preventative screenings, current guidelines, high calcium diet, regular exercise, and multivitamin daily. Labs with PCP.   Menopausal vaginal dryness - Plan: Estradiol 10 MCG TABS vaginal tablet 3x weekly. Doing well on this and wants to continue.   Osteopenia of multiple sites - 07/2022 T-score -1.5 without elevated FRAX. Improvement in all sites. Continue Vit D, Calcium and regular exercise. Will repeat DXA next year.   Postmenopausal - Stopped ERT, doing fine on Estroven.   Screening for cervical cancer - Normal Pap history.  No longer screening per  guidelines.  Screening for breast cancer - Normal mammogram history. Overdue and plans to schedule now. Normal breast exam today.  Screening for colon cancer -2024 colonoscopy. Will repeat at GI's recommended interval.   Return in about 1 year (around 07/11/2024) for Med follow up.    Olivia Mackie [facility], 9:34 AM 07/12/2023

## 2023-07-21 ENCOUNTER — Ambulatory Visit: Payer: PPO | Admitting: Psychology

## 2023-07-21 DIAGNOSIS — F4323 Adjustment disorder with mixed anxiety and depressed mood: Secondary | ICD-10-CM | POA: Diagnosis not present

## 2023-07-21 NOTE — Progress Notes (Signed)
 Progress Note:   Name: Kelly Myers Date: 07/21/2023 MRN: [id number] DOB: 1955/07/02 PCP: Adrian Prince, MD  Time spent: 1:00 PM - 1:58 PM  Today I met in person with Kelly Myers  for in office face-to-face individual psychotherapy.  Reason for Visit /Presenting Problem: Kelly Myers presents after she discovered that her husband has been using "heavy" drugs (ie., cocaine, Adderall).  Addiction problems have been part of his history (ie., gambling).  She confronted him, asked him to stop, and predictably he wasn't able to.  He also goes through 2 bottles of Vodka a week and often drinks socially.  Kelly Myers has been feeling depressed and anxious.  She has always been a happy, resourceful and resilient person.  Background History:  Kelly Myers (71) they have been married for 49 years.  They have two daughters Kelly Myers (98) and Kelly Myers (40).  Kelly Myers lives in [place] and is single.  She works for [name].  Kelly Myers lives in [place] and is married to a [employer].  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  Erisha and Kelly Myers are both originally from [place].  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 They both have aging parents. Her parents are divorced.  [name] and is 61 y.o.  Her father is deceased.  He died when he was 30 and Kelly Myers is 68 y.o. and lives at home.  His brother is a [name] and lives with their Myers and might be a Chartered loss adjuster.   Mental Status Exam: Appearance:   Casual     Behavior:  Appropriate  Motor:  Normal  Speech/Language:   NA  Affect:  Appropriate  Mood:  anxious and depressed  Thought process:  normal  Thought content:    WNL  Sensory/Perceptual disturbances:    WNL  Orientation:  oriented  to person, place, and time/date  Attention:  NA  Concentration:  Good  Memory:  WNL  Fund of knowledge:   Good  Insight:    Good  Judgment:   Good  Impulse Control:  Good   Reported Symptoms:  worrying about different things, not being able to control her worrying, difficulties relaxing, difficulties concentrating, disrupted appetite, difficulties getting motivated or enjoy normally pleasurable things, feeling bad about herself  Risk Assessment: Danger to Self:  No Self-injurious Behavior: No Danger to Others: No Duty to Warn:no Physical Aggression / Violence:No  Access to Firearms a concern: No  Gang Involvement:No .  Substance Abuse History: Current substance abuse: No     Past Psychiatric History:   Previous psychological history is significant for n/a Outpatient Providers:  History of Psych Hospitalization: No  Psychological Testing:  n/a    Abuse History:  Victim of: No.,  n/a    Report needed: No. Victim of Neglect:No. Perpetrator of  n/a   Witness / Exposure to Domestic Violence: No   Protective Services Involvement: No  Witness to [employer] Violence:  No   Family History:  Family History  Problem Relation Age of Onset   Hypertension Myers    Hypertension Father    Cancer Father        lung   Hypertension Sister    Heart disease Sister  Heart attack Sister    Heart disease Maternal Grandfather      Medical History/Surgical History: reviewed Past Medical History:  Diagnosis Date   Asthma    Asthma cough- meds control   Atrophic vaginitis    Bleeds easily (HCC)    "have had clotting studies, tests, I'm within normal limits" (02/12/2016)   Endometriosis    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 EYE ALLERGIES   History of blood transfusion 1984   following childbirth.   History of radiation therapy 1963   after adenoid surgery x 2, they "grew back" and she was treated w/ XRT to Kelly throat   Hypertension    Hypothyroidism    Osteopenia 05/2018    T score -1.4   Pneumonia    "as a child" (02/12/2016)   Thyroid nodule    Uterine prolapse     Past Surgical History:  Procedure Laterality Date   ADENOIDECTOMY  1960; 1963   ANTERIOR AND POSTERIOR VAGINAL REPAIR  2000s   BLADDER SUSPENSION  2000s   CESAREAN SECTION  1984   COLONOSCOPY WITH PROPOFOL N/A 11/04/2015   Procedure: COLONOSCOPY WITH PROPOFOL;  Surgeon: Charolett Bumpers, MD;  Location: [facility];  Service: Endoscopy;  Laterality: N/A;   TUBAL LIGATION  1984   VAGINAL HYSTERECTOMY  2000s   w/bladder suspension and A/P repair   WISDOM TOOTH EXTRACTION  1974    Medications: Current Outpatient Medications  Medication Sig Dispense Refill   bisoprolol (ZEBETA) 5 MG tablet Take 1 tablet (5 mg total) by mouth daily. 90 tablet 3   Calcium Carbonate-Vitamin D (CALCIUM + D PO) Take 1 tablet by mouth daily.     Cetirizine HCl 10 MG CAPS Take 10 mg by mouth daily.     Estradiol 10 MCG TABS vaginal tablet Place 1 tablet (10 mcg total) vaginally 3 (three) times a week. 36 tablet 3   fluticasone (FLONASE) 50 MCG/ACT nasal spray Place 2 sprays into both nostrils daily.     glucosamine-chondroitin 500-400 MG tablet Take 1 tablet by mouth daily.      irbesartan (AVAPRO) 150 MG tablet Take 1 tablet (150 mg total) by mouth daily. 90 tablet 3   levothyroxine (SYNTHROID, LEVOTHROID) 100 MCG tablet Take 1 tablet (100 mcg total) by mouth daily before breakfast. 30 tablet 0   montelukast (SINGULAIR) 10 MG tablet Take 10 mg by mouth at bedtime.     Nutritional Supplements (ESTROVEN PO) Take by mouth.     Oyster Shell Calcium 500 MG TABS one tab twice daily Oral     Probiotic Product (PROBIOTIC DAILY PO) Take 1 capsule by mouth daily.      rosuvastatin (CRESTOR) 10 MG tablet Take 1 tablet (10 mg total) by mouth daily. 90 tablet 3   VENTOLIN HFA 108 (90 Base) MCG/ACT inhaler   0   XIIDRA 5 % SOLN Apply to eye 2 (two) times daily.     No current facility-administered medications for this visit.     Allergies  Allergen Reactions   Metoprolol     Irritates asthma   Macrodantin [Nitrofurantoin] Hives   Neosporin [Neomycin-Bacitracin Zn-Polymyx] Rash     Individualized Treatment Plan        Strengths: strong family connections, helpful, caring, optimistic  Supports: adult children, sister   Goal/Needs for Treatment:  In order of importance to patient 1) Learn and implement skills and strategies to manage anxiety 2) Learn and implement skills and strategies to manage depression 3) Understand  Kelly impact her family role has had in her life, and learn how to separate and pursue her own interests/goals 4) Learn how to be more assertive (ie., set limits and boundaries) in a range of settings and experiences   Client Statement of Needs: Learn life skills that she feels she doesn't have, learning to be more open, honest and rely on other people, learn to be more her own person with her own friends and interests,    Treatment Level: Weekly Outpatient Individual Psychotherapy  Symptoms: anxious, can't stop worrying, "not able to let things go," distracted, difficulty sleeping, depressed,   Client Treatment Preferences:  Referred by Dr. Dellia Cloud   Healthcare consumer's goal for treatment:  Psychologist, Hilma Favors, Ph.D  will support Kelly patient's ability to achieve Kelly goals identified. Cognitive Behavioral Therapy, Dialectical Behavioral Therapy, Motivational Interviewing, Behavioral Motivation, and other evidenced-based practices will be used to promote progress towards healthy functioning.   Healthcare consumer Kelly Myers  will: Actively participate in therapy, working towards healthy functioning.    *Justification for Continuation/Discontinuation of Goal: R=Revised, O=Ongoing, A=Achieved, D=Discontinued  Goal 1) Learn and implement skills and strategies to manage anxiety  Likert rating baseline date: 05/06/23 Target Date Goal Was reviewed Status Code Progress towards  goal/Likert rating  05/05/2024           O              Goal 2) Learn and implement skills and strategies to manage depression  Likert rating baseline date: 05/06/23 Target Date Goal Was reviewed Status Code Progress towards goal/Likert rating  05/05/2024           O              Goal 3) Understand Kelly impact her family role has had in her life, and learn how to separate and pursue her own interests/goals  Likert rating baseline date: 05/06/23 Target Date Goal Was reviewed Status Code Progress towards goal/Likert rating  05/05/2024           O              Goal 4) Learn how to be more assertive (ie., set limits and boundaries) in a range of settings and experiences  5 Point Likert rating baseline date: 05/06/23 Target Date Goal Was reviewed Status Code Progress towards goal/Likert rating  05/05/2024            O                This plan has been reviewed and created by Kelly following participants:  This plan will be reviewed at least every 12 months. Date Behavioral Health Clinician Date Guardian/Patient   05/06/2023 Hilma Favors, Ph.D.  05/06/2023 Wilford Sports. [place]                   Diagnoses:  Adjustment Disorder with mixed anxious and depressed mood   Laytoya reports that she is anticipating have family over for Passover.  It will be Kelly first Passover without her Myers.  We d/p her thoughts and feelings of grief.  We d/p a number of issues related to her husband and her husband's family.  We e/ and p/s some of these issues.  Lastly, she stated that she was having trouble finding time to read.  We d/e where things get off track, and time management struggles.  By Kelly end of Kelly session, she felt supported, and had some practical guidance around Kelly management  of these challenges.   Hilma Favors, PhD   Husband: Kelly Myers Sister: Bonita Quin Brother-in-law: Raiford Noble

## 2023-07-22 ENCOUNTER — Ambulatory Visit: Payer: PPO | Admitting: Psychology

## 2023-07-28 ENCOUNTER — Ambulatory Visit
Admission: RE | Admit: 2023-07-28 | Discharge: 2023-07-28 | Disposition: A | Discharge status: Home / Self Care | Source: Ambulatory Visit | Attending: Nurse Practitioner | Admitting: Nurse Practitioner

## 2023-07-28 DIAGNOSIS — Z1231 Encounter for screening mammogram for malignant neoplasm of breast: Secondary | ICD-10-CM | POA: Diagnosis not present

## 2023-08-05 ENCOUNTER — Ambulatory Visit (INDEPENDENT_AMBULATORY_CARE_PROVIDER_SITE_OTHER): Payer: PPO | Admitting: Psychology

## 2023-08-05 DIAGNOSIS — F4323 Adjustment disorder with mixed anxiety and depressed mood: Secondary | ICD-10-CM | POA: Diagnosis not present

## 2023-08-05 NOTE — Progress Notes (Signed)
 Progress Note:   Name: Kelly Myers Date: 08/05/2023 MRN: [id number] DOB: [birth year] PCP: Rosslyn Coons, MD  Time spent: 1:00 PM - 1:58 PM  Today I met in person with Kelly Myers  for in office face-to-face individual psychotherapy.  Reason for Visit /Presenting Problem: Kelly Myers presents after she discovered that her husband has been using "heavy" drugs (ie., cocaine, Adderall).  Addiction problems have been part of his history (ie., gambling).  She confronted him, asked him to stop, and predictably he wasn't able to.  He also goes through 2 bottles of Vodka a week and often drinks socially.  Kelly Myers has been feeling depressed and anxious.  She has always been a happy, resourceful and resilient person.  Background History:  Kelly Myers and Kelly Myers (69) they have been married for 49 years.  They have two daughters Kelly Myers (40) and Kelly Myers (40).  Kelly Myers lives in [place] and is single.  She works for [name].  Kelly Myers lives in [place] and is married to a Kelly Myers.  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  Kelly Myers and Kelly Myers are both originally from [place].  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 They both have aging parents. Her parents are divorced.  [name] and is 68 y.o.  Her father is deceased.  He died when he was 68 and the longest lived Kelly Myers's Myers is 70 y.o. and lives at home.  His brother is a [name] and lives with their Myers and might be a Chartered loss adjuster.   Mental Status Exam: Appearance:   Casual     Behavior:  Appropriate  Motor:  Normal  Speech/Language:   NA  Affect:  Appropriate  Mood:  anxious and depressed  Thought process:  normal  Thought content:    WNL  Sensory/Perceptual disturbances:    WNL  Orientation:   oriented to person, place, and time/date  Attention:  NA  Concentration:  Good  Memory:  WNL  Fund of knowledge:   Good  Insight:    Good  Judgment:   Good  Impulse Control:  Good   Reported Symptoms:  worrying about different things, not being able to control her worrying, difficulties relaxing, difficulties concentrating, disrupted appetite, difficulties getting motivated or enjoy normally pleasurable things, feeling bad about herself  Risk Assessment: Danger to Self:  No Self-injurious Behavior: No Danger to Others: No Duty to Warn:no Physical Aggression / Violence:No  Access to Firearms a concern: No  Gang Involvement:No .  Substance Abuse History: Current substance abuse: No     Past Psychiatric History:   Previous psychological history is significant for n/a Outpatient Providers:  History of Psych Hospitalization: No  Psychological Testing:  n/a    Abuse History:  Victim of: No.,  n/a    Report needed: No. Victim of Neglect:No. Perpetrator of  n/a   Witness / Exposure to Domestic Violence: No   Protective Services Involvement: No  Witness to [employer] Violence:  No   Family History:  Family History  Problem Relation Age of Onset   Hypertension Myers    Hypertension Father    Cancer Father        lung   Hypertension Sister    Heart disease Sister  Heart attack Sister    Heart disease Maternal Grandfather      Medical History/Surgical History: reviewed Past Medical History:  Diagnosis Date   Asthma    Asthma cough- meds control   Atrophic vaginitis    Bleeds easily (HCC)    "have had clotting studies, tests, I'm within normal limits" (02/12/2016)   Endometriosis    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 EYE ALLERGIES   History of blood transfusion 1984   following childbirth.   History of radiation therapy 1963   after adenoid surgery x 2, they "grew back" and she was treated w/ XRT to the throat   Hypertension    Hypothyroidism    Osteopenia  05/2018   T score -1.4   Pneumonia    "as a child" (02/12/2016)   Thyroid  nodule    Uterine prolapse     Past Surgical History:  Procedure Laterality Date   ADENOIDECTOMY  1960; 1963   ANTERIOR AND POSTERIOR VAGINAL REPAIR  2000s   BLADDER SUSPENSION  2000s   CESAREAN SECTION  1984   COLONOSCOPY WITH PROPOFOL  N/A 11/04/2015   Procedure: COLONOSCOPY WITH PROPOFOL ;  Surgeon: Garrett Kallman, MD;  Location: [facility];  Service: Endoscopy;  Laterality: N/A;   TUBAL LIGATION  1984   VAGINAL HYSTERECTOMY  2000s   w/bladder suspension and A/P repair   WISDOM TOOTH EXTRACTION  1974    Medications: Current Outpatient Medications  Medication Sig Dispense Refill   bisoprolol  (ZEBETA ) 5 MG tablet Take 1 tablet (5 mg total) by mouth daily. 90 tablet 3   Calcium  Carbonate-Vitamin D  (CALCIUM  + D PO) Take 1 tablet by mouth daily.     Cetirizine HCl 10 MG CAPS Take 10 mg by mouth daily.     Estradiol  10 MCG TABS vaginal tablet Place 1 tablet (10 mcg total) vaginally 3 (three) times a week. 36 tablet 3   fluticasone  (FLONASE ) 50 MCG/ACT nasal spray Place 2 sprays into both nostrils daily.     glucosamine-chondroitin 500-400 MG tablet Take 1 tablet by mouth daily.      irbesartan  (AVAPRO ) 150 MG tablet Take 1 tablet (150 mg total) by mouth daily. 90 tablet 3   levothyroxine  (SYNTHROID , LEVOTHROID) 100 MCG tablet Take 1 tablet (100 mcg total) by mouth daily before breakfast. 30 tablet 0   montelukast  (SINGULAIR ) 10 MG tablet Take 10 mg by mouth at bedtime.     Nutritional Supplements (ESTROVEN PO) Take by mouth.     Oyster Shell Calcium  500 MG TABS one tab twice daily Oral     Probiotic Product (PROBIOTIC DAILY PO) Take 1 capsule by mouth daily.      rosuvastatin  (CRESTOR ) 10 MG tablet Take 1 tablet (10 mg total) by mouth daily. 90 tablet 3   VENTOLIN HFA 108 (90 Base) MCG/ACT inhaler   0   XIIDRA 5 % SOLN Apply to eye 2 (two) times daily.     No current facility-administered medications for  this visit.    Allergies  Allergen Reactions   Metoprolol      Irritates asthma   Macrodantin [Nitrofurantoin] Hives   Neosporin [Neomycin-Bacitracin Zn-Polymyx] Rash     Individualized Treatment Plan        Strengths: strong family connections, helpful, caring, optimistic  Supports: adult children, sister   Goal/Needs for Treatment:  In order of importance to patient 1) Learn and implement skills and strategies to manage anxiety 2) Learn and implement skills and strategies to manage depression 3) Understand  the impact her family role has had in her life, and learn how to separate and pursue her own interests/goals 4) Learn how to be more assertive (ie., set limits and boundaries) in a range of settings and experiences   Client Statement of Needs: Learn life skills that she feels she doesn't have, learning to be more open, honest and rely on other people, learn to be more her own person with her own friends and interests,    Treatment Level: Weekly Outpatient Individual Psychotherapy  Symptoms: anxious, can't stop worrying, "not able to let things go," distracted, difficulty sleeping, depressed,   Client Treatment Preferences:  Referred by Dr. Onetha Bile   Healthcare consumer's goal for treatment:  Psychologist, Elder Greening, Ph.D  will support the patient's ability to achieve the goals identified. Cognitive Behavioral Therapy, Dialectical Behavioral Therapy, Motivational Interviewing, Behavioral Motivation, and other evidenced-based practices will be used to promote progress towards healthy functioning.   Healthcare consumer Kelly Myers  will: Actively participate in therapy, working towards healthy functioning.    *Justification for Continuation/Discontinuation of Goal: R=Revised, O=Ongoing, A=Achieved, D=Discontinued  Goal 1) Learn and implement skills and strategies to manage anxiety  Likert rating baseline date: 05/06/23 Target Date Goal Was reviewed Status Code  Progress towards goal/Likert rating  05/05/2024           O              Goal 2) Learn and implement skills and strategies to manage depression  Likert rating baseline date: 05/06/23 Target Date Goal Was reviewed Status Code Progress towards goal/Likert rating  05/05/2024           O              Goal 3) Understand the impact her family role has had in her life, and learn how to separate and pursue her own interests/goals  Likert rating baseline date: 05/06/23 Target Date Goal Was reviewed Status Code Progress towards goal/Likert rating  05/05/2024           O              Goal 4) Learn how to be more assertive (ie., set limits and boundaries) in a range of settings and experiences  5 Point Likert rating baseline date: 05/06/23 Target Date Goal Was reviewed Status Code Progress towards goal/Likert rating  05/05/2024            O                This plan has been reviewed and created by the following participants:  This plan will be reviewed at least every 12 months. Date Behavioral Health Clinician Date Guardian/Patient   05/06/2023 Elder Greening, Ph.D.  05/06/2023 Kelly Myers                   Diagnoses:  Adjustment Disorder with mixed anxious and depressed mood   Kelly Myers reports that she is packing, planning, and looking forward to her trip.  We d/e/p how she is handling her sister's dependence differently, how she felt, requesting guidance for how to handle things differently, and changing family patterns.  We d/e/p any challenges she is anticipating, how to challenge her anxious thinking, and other strategies for managing situations around her husband's addiction.  Elder Greening, PhD   Husband: Kelly Myers Sister: Kelly Myers Brother-in-law: Kelly Myers

## 2023-08-25 ENCOUNTER — Ambulatory Visit (INDEPENDENT_AMBULATORY_CARE_PROVIDER_SITE_OTHER): Admitting: Psychology

## 2023-08-25 DIAGNOSIS — F4323 Adjustment disorder with mixed anxiety and depressed mood: Secondary | ICD-10-CM

## 2023-08-25 NOTE — Progress Notes (Signed)
 Progress Note:   Name: Kelly Myers Date: 08/25/2023 MRN: [id number] DOB: [birth year] PCP: Rosslyn Coons, MD   Time spent: 11:00 AM - 11:58 AM   Today I met in person with Kelly Myers  for in office face-to-face individual psychotherapy.   Reason for Visit /Presenting Problem: Kelly Myers presents after she discovered that her husband has been using "heavy" drugs (ie., cocaine, Adderall).  Addiction problems have been part of his history (ie., gambling).  She confronted him, asked him to stop, and predictably he wasn't able to.  He also goes through 2 bottles of Vodka a week and often drinks socially.  Kelly Myers has been feeling depressed and anxious.  She has always been a happy, resourceful and resilient person.  Background History:  Kelly Myers and Kelly Myers (69) they have been married for 49 years.  They have two daughters Kelly Myers (31) and Kelly Myers (40).  Kelly Myers lives in [place] and is single.  She works for [name].  Kelly Myers lives in [place] and is married to a [employer].  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  Kelly Myers and Kelly Myers are both originally from [place].  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 They both have aging parents. Her parents are divorced.  Mother lives at [facility] and is 80 y.o.  Her father is deceased.  He died when he was 49 and the longest lived [name]  Kelly Myers's mother is 58 y.o. and lives at home.  His brother is a [name] and lives with their mother and might be a Chartered loss adjuster.   Mental Status Exam: Appearance:   Casual     Behavior:  Appropriate  Motor:  Normal  Speech/Language:   NA  Affect:  Appropriate  Mood:  anxious and depressed  Thought process:  normal  Thought content:    WNL  Sensory/Perceptual disturbances:    WNL  Orientation:   oriented to person, place, and time/date  Attention:  NA  Concentration:  Good  Memory:  WNL  Fund of knowledge:   Good  Insight:    Good  Judgment:   Good  Impulse Control:  Good   Reported Symptoms:  worrying about different things, not being able to control her worrying, difficulties relaxing, difficulties concentrating, disrupted appetite, difficulties getting motivated or enjoy normally pleasurable things, feeling bad about herself  Risk Assessment: Danger to Self:  No Self-injurious Behavior: No Danger to Others: No Duty to Warn:no Physical Aggression / Violence:No  Access to Firearms a concern: No  Gang Involvement:No .  Substance Abuse History: Current substance abuse: No     Past Psychiatric History:   Previous psychological history is significant for n/a Outpatient Providers:  History of Psych Hospitalization: No  Psychological Testing: n/a   Abuse History:  Victim of: No., n/a   Report needed: No. Victim of Neglect:No. Perpetrator of n/a  Witness / Exposure to Domestic Violence: No   Protective Services Involvement: No  Witness to [employer] Violence:  No   Family History:  Family History  Problem Relation Age of Onset   Hypertension Mother    Hypertension Father    Cancer Father        lung   Hypertension Sister    Heart disease Sister  Heart attack Sister    Heart disease Maternal Grandfather      Medical History/Surgical History: reviewed Past Medical History:  Diagnosis Date   Asthma    Asthma cough- meds control   Atrophic vaginitis    Bleeds easily (HCC)    "have had clotting studies, tests, I'm within normal limits" (02/12/2016)   Endometriosis    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 EYE ALLERGIES   History of blood transfusion 1984   following childbirth.   History of radiation therapy 1963   after adenoid surgery x 2, they "grew back" and she was treated w/ XRT to the throat   Hypertension    Hypothyroidism    Osteopenia 05/2018    T score -1.4   Pneumonia    "as a child" (02/12/2016)   Thyroid  nodule    Uterine prolapse     Past Surgical History:  Procedure Laterality Date   ADENOIDECTOMY  1960; 1963   ANTERIOR AND POSTERIOR VAGINAL REPAIR  2000s   BLADDER SUSPENSION  2000s   CESAREAN SECTION  1984   COLONOSCOPY WITH PROPOFOL  N/A 11/04/2015   Procedure: COLONOSCOPY WITH PROPOFOL ;  Surgeon: Garrett Kallman, MD;  Location: [facility];  Service: Endoscopy;  Laterality: N/A;   TUBAL LIGATION  1984   VAGINAL HYSTERECTOMY  2000s   w/bladder suspension and A/P repair   WISDOM TOOTH EXTRACTION  1974    Medications: Current Outpatient Medications  Medication Sig Dispense Refill   bisoprolol  (ZEBETA ) 5 MG tablet Take 1 tablet (5 mg total) by mouth daily. 90 tablet 3   Calcium  Carbonate-Vitamin D  (CALCIUM  + D PO) Take 1 tablet by mouth daily.     Cetirizine HCl 10 MG CAPS Take 10 mg by mouth daily.     Estradiol  10 MCG TABS vaginal tablet Place 1 tablet (10 mcg total) vaginally 3 (three) times a week. 36 tablet 3   fluticasone  (FLONASE ) 50 MCG/ACT nasal spray Place 2 sprays into both nostrils daily.     glucosamine-chondroitin 500-400 MG tablet Take 1 tablet by mouth daily.      irbesartan  (AVAPRO ) 150 MG tablet Take 1 tablet (150 mg total) by mouth daily. 90 tablet 3   levothyroxine  (SYNTHROID , LEVOTHROID) 100 MCG tablet Take 1 tablet (100 mcg total) by mouth daily before breakfast. 30 tablet 0   montelukast  (SINGULAIR ) 10 MG tablet Take 10 mg by mouth at bedtime.     Nutritional Supplements (ESTROVEN PO) Take by mouth.     Oyster Shell Calcium  500 MG TABS one tab twice daily Oral     Probiotic Product (PROBIOTIC DAILY PO) Take 1 capsule by mouth daily.      rosuvastatin  (CRESTOR ) 10 MG tablet Take 1 tablet (10 mg total) by mouth daily. 90 tablet 3   VENTOLIN HFA 108 (90 Base) MCG/ACT inhaler   0   XIIDRA 5 % SOLN Apply to eye 2 (two) times daily.     No current facility-administered medications for this  visit.    Allergies  Allergen Reactions   Metoprolol      Irritates asthma   Macrodantin [Nitrofurantoin] Hives   Neosporin [Neomycin-Bacitracin Zn-Polymyx] Rash     Individualized Treatment Plan        Strengths: strong family connections, helpful, caring, optimistic  Supports: adult children, sister   Goal/Needs for Treatment:  In order of importance to patient 1) Learn and implement skills and strategies to manage anxiety 2) Learn and implement skills and strategies to manage depression 3) Understand  the impact her family role has had in her life, and learn how to separate and pursue her own interests/goals 4) Learn how to be more assertive (ie., set limits and boundaries) in a range of settings and experiences   Client Statement of Needs: Learn life skills that she feels she doesn't have, learning to be more open, honest and rely on other people, learn to be more her own person with her own friends and interests,    Treatment Level: Weekly Outpatient Individual Psychotherapy  Symptoms: anxious, can't stop worrying, "not able to let things go," distracted, difficulty sleeping, depressed,   Client Treatment Preferences:  Referred by Dr. Onetha Bile   Healthcare consumer's goal for treatment:  Psychologist, Elder Greening, Ph.D  will support the patient's ability to achieve the goals identified. Cognitive Behavioral Therapy, Dialectical Behavioral Therapy, Motivational Interviewing, Behavioral Motivation, and other evidenced-based practices will be used to promote progress towards healthy functioning.   Healthcare consumer Kelly Myers  will: Actively participate in therapy, working towards healthy functioning.    *Justification for Continuation/Discontinuation of Goal: R=Revised, O=Ongoing, A=Achieved, D=Discontinued  Goal 1) Learn and implement skills and strategies to manage anxiety  Likert rating baseline date: 05/06/23 Target Date Goal Was reviewed Status Code Progress  towards goal/Likert rating  05/05/2024           O              Goal 2) Learn and implement skills and strategies to manage depression  Likert rating baseline date: 05/06/23 Target Date Goal Was reviewed Status Code Progress towards goal/Likert rating  05/05/2024           O              Goal 3) Understand the impact her family role has had in her life, and learn how to separate and pursue her own interests/goals  Likert rating baseline date: 05/06/23 Target Date Goal Was reviewed Status Code Progress towards goal/Likert rating  05/05/2024           O              Goal 4) Learn how to be more assertive (ie., set limits and boundaries) in a range of settings and experiences  5 Point Likert rating baseline date: 05/06/23 Target Date Goal Was reviewed Status Code Progress towards goal/Likert rating  05/05/2024            O                This plan has been reviewed and created by the following participants:  This plan will be reviewed at least every 12 months. Date Behavioral Health Clinician Date Guardian/Patient   05/06/2023 Elder Greening, Ph.D.  05/06/2023 Geralyn Knee. [place]                   Diagnoses:  Adjustment Disorder with mixed anxious and depressed mood   Lan reports that she f/u with her husband insisting that he make sure to have appointments set up with his therapist for when they returned.  We d/e/p what it was like to set limits with her husband.  When they returned from their trip, they came home to a crisis related to his mother's health.  We d/e/p recognizing and managing her anxiety, learning to step back and not react from anxiety, and allowing her husband and his brother the space to act independently.  Lastly, Jan's sister and her husband also traveled with them, and  it was a good experience or a number of reasons.  Here once again, Jan is learning to recognize when her anxiety shows up, not reacting from anxiety (taking control), allowing others the space to  figure things out for themselves successfully, and noticing that it actually feels good.   Elder Greening, PhD   Husband: Kelly Myers Sister: Stana Ear Brother-in-law: Sharin David

## 2023-09-08 ENCOUNTER — Ambulatory Visit (INDEPENDENT_AMBULATORY_CARE_PROVIDER_SITE_OTHER): Admitting: Psychology

## 2023-09-08 ENCOUNTER — Other Ambulatory Visit: Payer: Self-pay

## 2023-09-08 DIAGNOSIS — F4323 Adjustment disorder with mixed anxiety and depressed mood: Secondary | ICD-10-CM

## 2023-09-08 MED ORDER — ROSUVASTATIN CALCIUM 10 MG PO TABS: 10.0000 mg | ORAL_TABLET | Freq: Every day | ORAL | 0 refills | Status: DC

## 2023-09-08 NOTE — Progress Notes (Signed)
 Progress Note:   Name: Kelly Myers Date: 09/08/2023 MRN: [id number] DOB: November 20, 1955 PCP: Rosslyn Coons, MD   Time spent: 11:00 AM - 11:58 AM   Today I met in person with Kelly Myers  for in office face-to-face individual psychotherapy.   Reason for Visit /Presenting Problem: [name] presents after she discovered that her husband has been using "heavy" drugs (ie., cocaine, Adderall).  Addiction problems have been part of his history (ie., gambling).  She confronted him, asked him to stop, and predictably he wasn't able to.  He also goes through 2 bottles of Vodka a week and often drinks socially.  [name] has been feeling depressed and anxious.  She has always been a happy, resourceful and resilient person.  Background History:  Kelly and Kelly Myers (68) they have been married for 49 years.  They have two daughters Kelly Myers (49) and Kelly Myers (40).  Kelly Myers lives in [place] and is single.  She works for [name].  Kelly Myers lives in [place] and is married to a [employer].  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  Kelly Myers and Kelly Myers are both originally from [place].  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 They both have aging parents. Her parents are divorced.  Mother lives at [facility] and is 69 y.o.  Her father is deceased.  He died when he was 43 and the longest lived [name]  Kelly Myers's mother is 58 y.o. and lives at home.  His brother is a [name] and lives with their mother and might be a Chartered loss adjuster.   Mental Status Exam: Appearance:   Casual     Behavior:  Appropriate  Motor:  Normal  Speech/Language:   NA  Affect:  Appropriate  Mood:  anxious and depressed  Thought process:  normal  Thought content:    WNL  Sensory/Perceptual disturbances:    WNL  Orientation:   oriented to person, place, and time/date  Attention:  NA  Concentration:  Good  Memory:  WNL  Fund of knowledge:   Good  Insight:    Good  Judgment:   Good  Impulse Control:  Good   Reported Symptoms:  worrying about different things, not being able to control her worrying, difficulties relaxing, difficulties concentrating, disrupted appetite, difficulties getting motivated or enjoy normally pleasurable things, feeling bad about herself  Risk Assessment: Danger to Self:  No Self-injurious Behavior: No Danger to Others: No Duty to Warn:no Physical Aggression / Violence:No  Access to Firearms a concern: No  Gang Involvement:No .  Substance Abuse History: Current substance abuse: No     Past Psychiatric History:   Previous psychological history is significant for n/a Outpatient Providers:  History of Psych Hospitalization: No  Psychological Testing: n/a   Abuse History:  Victim of: No., n/a   Report needed: No. Victim of Neglect:No. Perpetrator of n/a  Witness / Exposure to Domestic Violence: No   Protective Services Involvement: No  Witness to Kelly Myers Violence:  No   Family History:  Family History  Problem Relation Age of Onset   Hypertension Mother    Hypertension Father    Cancer Father        lung   Hypertension Sister    Heart disease Sister  Heart attack Sister    Heart disease Maternal Grandfather      Medical History/Surgical History: reviewed Past Medical History:  Diagnosis Date   Asthma    Asthma cough- meds control   Atrophic vaginitis    Bleeds easily (HCC)    "have had clotting studies, tests, I'm within normal limits" (02/12/2016)   Endometriosis    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 EYE ALLERGIES   History of blood transfusion 1984   following childbirth.   History of radiation therapy 1963   after adenoid surgery x 2, they "grew back" and she was treated w/ XRT to the throat   Hypertension    Hypothyroidism    Osteopenia 05/2018    T score -1.4   Pneumonia    "as a child" (02/12/2016)   Thyroid  nodule    Uterine prolapse     Past Surgical History:  Procedure Laterality Date   ADENOIDECTOMY  1960; 1963   ANTERIOR AND POSTERIOR VAGINAL REPAIR  2000s   BLADDER SUSPENSION  2000s   CESAREAN SECTION  1984   COLONOSCOPY WITH PROPOFOL  N/A 11/04/2015   Procedure: COLONOSCOPY WITH PROPOFOL ;  Surgeon: Garrett Kallman, MD;  Location: [facility];  Service: Endoscopy;  Laterality: N/A;   TUBAL LIGATION  1984   VAGINAL HYSTERECTOMY  2000s   w/bladder suspension and A/P repair   WISDOM TOOTH EXTRACTION  1974    Medications: Current Outpatient Medications  Medication Sig Dispense Refill   bisoprolol  (ZEBETA ) 5 MG tablet Take 1 tablet (5 mg total) by mouth daily. 90 tablet 3   Calcium  Carbonate-Vitamin D  (CALCIUM  + D PO) Take 1 tablet by mouth daily.     Cetirizine HCl 10 MG CAPS Take 10 mg by mouth daily.     Estradiol  10 MCG TABS vaginal tablet Place 1 tablet (10 mcg total) vaginally 3 (three) times a week. 36 tablet 3   fluticasone  (FLONASE ) 50 MCG/ACT nasal spray Place 2 sprays into both nostrils daily.     glucosamine-chondroitin 500-400 MG tablet Take 1 tablet by mouth daily.      irbesartan  (AVAPRO ) 150 MG tablet Take 1 tablet (150 mg total) by mouth daily. 90 tablet 3   levothyroxine  (SYNTHROID , LEVOTHROID) 100 MCG tablet Take 1 tablet (100 mcg total) by mouth daily before breakfast. 30 tablet 0   montelukast  (SINGULAIR ) 10 MG tablet Take 10 mg by mouth at bedtime.     Nutritional Supplements (ESTROVEN PO) Take by mouth.     Oyster Shell Calcium  500 MG TABS one tab twice daily Oral     Probiotic Product (PROBIOTIC DAILY PO) Take 1 capsule by mouth daily.      rosuvastatin  (CRESTOR ) 10 MG tablet Take 1 tablet (10 mg total) by mouth daily. 90 tablet 3   VENTOLIN HFA 108 (90 Base) MCG/ACT inhaler   0   XIIDRA 5 % SOLN Apply to eye 2 (two) times daily.     No current facility-administered medications for this  visit.    Allergies  Allergen Reactions   Metoprolol      Irritates asthma   Macrodantin [Nitrofurantoin] Hives   Neosporin [Neomycin-Bacitracin Zn-Polymyx] Rash     Individualized Treatment Plan        Strengths: strong family connections, helpful, caring, optimistic  Supports: adult children, sister   Goal/Needs for Treatment:  In order of importance to patient 1) Learn and implement skills and strategies to manage anxiety 2) Learn and implement skills and strategies to manage depression 3) Understand  the impact her family role has had in her life, and learn how to separate and pursue her own interests/goals 4) Learn how to be more assertive (ie., set limits and boundaries) in a range of settings and experiences   Client Statement of Needs: Learn life skills that she feels she doesn't have, learning to be more open, honest and rely on other people, learn to be more her own person with her own friends and interests,    Treatment Level: Weekly Outpatient Individual Psychotherapy  Symptoms: anxious, can't stop worrying, "not able to let things go," distracted, difficulty sleeping, depressed,   Client Treatment Preferences:  Referred by Dr. Onetha Bile   Healthcare consumer's goal for treatment:  Psychologist, Elder Greening, Ph.D  will support the patient's ability to achieve the goals identified. Cognitive Behavioral Therapy, Dialectical Behavioral Therapy, Motivational Interviewing, Behavioral Motivation, and other evidenced-based practices will be used to promote progress towards healthy functioning.   Healthcare consumer Kelly Myers  will: Actively participate in therapy, working towards healthy functioning.    *Justification for Continuation/Discontinuation of Goal: R=Revised, O=Ongoing, A=Achieved, D=Discontinued  Goal 1) Learn and implement skills and strategies to manage anxiety  Likert rating baseline date: 05/06/23 Target Date Goal Was reviewed Status Code Progress  towards goal/Likert rating  05/05/2024           O              Goal 2) Learn and implement skills and strategies to manage depression  Likert rating baseline date: 05/06/23 Target Date Goal Was reviewed Status Code Progress towards goal/Likert rating  05/05/2024           O              Goal 3) Understand the impact her family role has had in her life, and learn how to separate and pursue her own interests/goals  Likert rating baseline date: 05/06/23 Target Date Goal Was reviewed Status Code Progress towards goal/Likert rating  05/05/2024           O              Goal 4) Learn how to be more assertive (ie., set limits and boundaries) in a range of settings and experiences  5 Point Likert rating baseline date: 05/06/23 Target Date Goal Was reviewed Status Code Progress towards goal/Likert rating  05/05/2024            O                This plan has been reviewed and created by the following participants:  This plan will be reviewed at least every 12 months. Date Behavioral Health Clinician Date Guardian/Patient   05/06/2023 Elder Greening, Ph.D.  05/06/2023 Geralyn Knee. Muriel                   Diagnoses:  Adjustment Disorder with mixed anxious and depressed mood   Kandise reports that her mother-in-law passed away last week.  We d/e/p what occurred, how she felt, and what it was like restraining the urge to "take over" as a means of managing her anxiety.  We were able to talk about the ways she is beginning to recognize how automatic her patterns are, continuing to focus on healthier patterns, and as a result, noticing the small changes in those around her.  We d/ the complications of having to put your deceased loved ones affairs in order and of not having someone to show you a  road map for how to get through it all.  I provided the support and space for Jan to grieve and p/ her losses.  Elder Greening, PhD   Husband: Kelly Myers Sister: Stana Ear Brother-in-law: Sharin David

## 2023-09-10 ENCOUNTER — Encounter: Payer: Self-pay | Admitting: Podiatry

## 2023-09-10 ENCOUNTER — Ambulatory Visit: Admitting: Podiatry

## 2023-09-10 DIAGNOSIS — M7752 Other enthesopathy of left foot: Secondary | ICD-10-CM | POA: Diagnosis not present

## 2023-09-11 NOTE — Progress Notes (Signed)
 Subjective:   Patient ID: Kelly Myers, female   DOB: 68 y.o.   MRN: [id number]   HPI Patient presents with pain in the lateral fifth metatarsal base with fluid buildup and lesion which is occurred secondarily.  States it has been present for about a month   ROS      Objective:  Physical Exam  Ocular status intact inflammation pain of the subfifth metatarsal base with lesion formation lucent core     Assessment:  Inflammatory capsulitis fifth MPJ base left with pain     Plan:  H&P sterile prep injected the plantar capsule left 3 mg Dexasone Kenalog  5 mg Xylocaine around the plantar capsule lateral capsule did debride a lesion courtesy reappoint as needed

## 2023-09-14 DIAGNOSIS — L578 Other skin changes due to chronic exposure to nonionizing radiation: Secondary | ICD-10-CM | POA: Diagnosis not present

## 2023-09-14 DIAGNOSIS — L814 Other melanin hyperpigmentation: Secondary | ICD-10-CM | POA: Diagnosis not present

## 2023-09-14 DIAGNOSIS — D2272 Melanocytic nevi of left lower limb, including hip: Secondary | ICD-10-CM | POA: Diagnosis not present

## 2023-09-14 DIAGNOSIS — L821 Other seborrheic keratosis: Secondary | ICD-10-CM | POA: Diagnosis not present

## 2023-09-14 DIAGNOSIS — D225 Melanocytic nevi of trunk: Secondary | ICD-10-CM | POA: Diagnosis not present

## 2023-09-14 DIAGNOSIS — L219 Seborrheic dermatitis, unspecified: Secondary | ICD-10-CM | POA: Diagnosis not present

## 2023-09-30 ENCOUNTER — Ambulatory Visit (INDEPENDENT_AMBULATORY_CARE_PROVIDER_SITE_OTHER): Admitting: Psychology

## 2023-09-30 DIAGNOSIS — F411 Generalized anxiety disorder: Secondary | ICD-10-CM

## 2023-09-30 DIAGNOSIS — F4323 Adjustment disorder with mixed anxiety and depressed mood: Secondary | ICD-10-CM | POA: Diagnosis not present

## 2023-09-30 DIAGNOSIS — R41840 Attention and concentration deficit: Secondary | ICD-10-CM

## 2023-09-30 NOTE — Progress Notes (Signed)
 Progress Note:   Name: Kelly Myers Date: 09/30/2023 MRN: [id number] DOB: [birth year] PCP: Rosslyn Coons, MD   Time spent: 10:00 AM - 10:58 AM   Today I met in person with Kelly Myers  for in office face-to-face individual psychotherapy.   Reason for Visit /Presenting Problem: AZIYA ARENA presents after she discovered that her husband has been using heavy drugs (ie., cocaine, Adderall).  Addiction problems have been part of his history (ie., gambling).  She confronted him, asked him to stop, and predictably he wasn't able to.  He also goes through 2 bottles of Vodka a week and often drinks socially.  Shelle has been feeling depressed and anxious.  She has always been a happy, resourceful and resilient person.  Background History:  Kelly and Kelly Myers (68) they have been married for 49 years.  They have two daughters Kelly Myers (69) and Kelly Myers (40).  Kelly Myers lives in [place] and is single.  She works for [name].  Kelly Myers lives in [place] and is married to a [employer].  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  Kelly Myers and Kelly Myers are both originally from [place].  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 They both have aging parents. Her parents are divorced.  Mother lives at [facility] and is 15 y.o.  Her father is deceased.  He died when he was 72 and the longest lived [name]  [name] is 52 y.o. and lives at home.  His brother is a [name] and lives with their mother and might be a Chartered loss adjuster.   Mental Status Exam: Appearance:   Casual     Behavior:  Appropriate  Motor:  Normal  Speech/Language:   NA  Affect:  Appropriate  Mood:  anxious and depressed  Thought process:  normal  Thought content:    WNL  Sensory/Perceptual disturbances:    WNL  Orientation:   oriented to person, place, and time/date  Attention:  NA  Concentration:  Good  Memory:  WNL  Fund of knowledge:   Good  Insight:    Good  Judgment:   Good  Impulse Control:  Good   Reported Symptoms:  worrying about different things, not being able to control her worrying, difficulties relaxing, difficulties concentrating, disrupted appetite, difficulties getting motivated or enjoy normally pleasurable things, feeling bad about herself  Risk Assessment: Danger to Self:  No Self-injurious Behavior: No Danger to Others: No Duty to Warn:no Physical Aggression / Violence:No  Access to Firearms a concern: No  Gang Involvement:No .  Substance Abuse History: Current substance abuse: No     Past Psychiatric History:   Previous psychological history is significant for n/a Outpatient Providers:  History of Psych Hospitalization: No  Psychological Testing: n/a   Abuse History:  Victim of: No., n/a   Report needed: No. Victim of Neglect:No. Perpetrator of n/a  Witness / Exposure to Domestic Violence: No   Protective Services Involvement: No  Witness to [employer] Violence:  No   Family History:  Family History  Problem Relation Age of Onset   Hypertension Mother    Hypertension Father    Cancer Father        lung   Hypertension Sister    Heart disease Sister  Heart attack Sister    Heart disease Maternal Grandfather      Medical History/Surgical History: reviewed Past Medical History:  Diagnosis Date   Asthma    Asthma cough- meds control   Atrophic vaginitis    Bleeds easily (HCC)    have had clotting studies, tests, I'm within normal limits (02/12/2016)   Endometriosis    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 EYE ALLERGIES   History of blood transfusion 1984   following childbirth.   History of radiation therapy 1963   after adenoid surgery x 2, they grew back and she was treated w/ XRT to the throat   Hypertension    Hypothyroidism    Osteopenia 05/2018    T score -1.4   Pneumonia    as a child (02/12/2016)   Thyroid  nodule    Uterine prolapse     Past Surgical History:  Procedure Laterality Date   ADENOIDECTOMY  1960; 1963   ANTERIOR AND POSTERIOR VAGINAL REPAIR  2000s   BLADDER SUSPENSION  2000s   CESAREAN SECTION  1984   COLONOSCOPY WITH PROPOFOL  N/A 11/04/2015   Procedure: COLONOSCOPY WITH PROPOFOL ;  Surgeon: Garrett Kallman, MD;  Location: [facility];  Service: Endoscopy;  Laterality: N/A;   TUBAL LIGATION  1984   VAGINAL HYSTERECTOMY  2000s   w/bladder suspension and A/P repair   WISDOM TOOTH EXTRACTION  1974    Medications: Current Outpatient Medications  Medication Sig Dispense Refill   bisoprolol  (ZEBETA ) 5 MG tablet Take 1 tablet (5 mg total) by mouth daily. 90 tablet 3   Calcium  Carbonate-Vitamin D  (CALCIUM  + D PO) Take 1 tablet by mouth daily.     Cetirizine HCl 10 MG CAPS Take 10 mg by mouth daily.     Estradiol  10 MCG TABS vaginal tablet Place 1 tablet (10 mcg total) vaginally 3 (three) times a week. 36 tablet 3   fluticasone  (FLONASE ) 50 MCG/ACT nasal spray Place 2 sprays into both nostrils daily.     glucosamine-chondroitin 500-400 MG tablet Take 1 tablet by mouth daily.      irbesartan  (AVAPRO ) 150 MG tablet Take 1 tablet (150 mg total) by mouth daily. 90 tablet 3   levothyroxine  (SYNTHROID , LEVOTHROID) 100 MCG tablet Take 1 tablet (100 mcg total) by mouth daily before breakfast. 30 tablet 0   montelukast  (SINGULAIR ) 10 MG tablet Take 10 mg by mouth at bedtime.     Nutritional Supplements (ESTROVEN PO) Take by mouth.     Oyster Shell Calcium  500 MG TABS one tab twice daily Oral     Probiotic Product (PROBIOTIC DAILY PO) Take 1 capsule by mouth daily.      rosuvastatin  (CRESTOR ) 10 MG tablet Take 1 tablet (10 mg total) by mouth daily. 90 tablet 0   VENTOLIN HFA 108 (90 Base) MCG/ACT inhaler   0   XIIDRA 5 % SOLN Apply to eye 2 (two) times daily.     No current facility-administered medications for this  visit.    Allergies  Allergen Reactions   Metoprolol      Irritates asthma   Macrodantin [Nitrofurantoin] Hives   Neosporin [Neomycin-Bacitracin Zn-Polymyx] Rash     Individualized Treatment Plan        Strengths: strong family connections, helpful, caring, optimistic  Supports: adult children, sister   Goal/Needs for Treatment:  In order of importance to patient 1) Learn and implement skills and strategies to manage anxiety 2) Learn and implement skills and strategies to manage depression 3) Understand  the impact her family role has had in her life, and learn how to separate and pursue her own interests/goals 4) Learn how to be more assertive (ie., set limits and boundaries) in a range of settings and experiences   Client Statement of Needs: Learn life skills that she feels she doesn't have, learning to be more open, honest and rely on other people, learn to be more her own person with her own friends and interests,    Treatment Level: Weekly Outpatient Individual Psychotherapy  Symptoms: anxious, can't stop worrying, not able to let things go, distracted, difficulty sleeping, depressed,   Client Treatment Preferences:  Referred by Dr. Onetha Bile   Healthcare consumer's goal for treatment:  Psychologist, Elder Greening, Ph.D  will support the patient's ability to achieve the goals identified. Cognitive Behavioral Therapy, Dialectical Behavioral Therapy, Motivational Interviewing, Behavioral Motivation, and other evidenced-based practices will be used to promote progress towards healthy functioning.   Healthcare consumer Kelly Myers  will: Actively participate in therapy, working towards healthy functioning.    *Justification for Continuation/Discontinuation of Goal: R=Revised, O=Ongoing, A=Achieved, D=Discontinued  Goal 1) Learn and implement skills and strategies to manage anxiety  Likert rating baseline date: 05/06/23 Target Date Goal Was reviewed Status Code Progress  towards goal/Likert rating  05/05/2024           O              Goal 2) Learn and implement skills and strategies to manage depression  Likert rating baseline date: 05/06/23 Target Date Goal Was reviewed Status Code Progress towards goal/Likert rating  05/05/2024           O              Goal 3) Understand the impact her family role has had in her life, and learn how to separate and pursue her own interests/goals  Likert rating baseline date: 05/06/23 Target Date Goal Was reviewed Status Code Progress towards goal/Likert rating  05/05/2024           O              Goal 4) Learn how to be more assertive (ie., set limits and boundaries) in a range of settings and experiences  5 Point Likert rating baseline date: 05/06/23 Target Date Goal Was reviewed Status Code Progress towards goal/Likert rating  05/05/2024            O                This plan has been reviewed and created by the following participants:  This plan will be reviewed at least every 12 months. Date Behavioral Health Clinician Date Guardian/Patient   05/06/2023 Elder Greening, Ph.D.  05/06/2023 Geralyn Knee. Domeier                   Diagnoses:  Adjustment Disorder with mixed anxious and depressed mood   Jocilyn reports that she is feeling overwhelmed with the things she needs to do ever since hers and her husband's mother passed away.  We d/e/p where she gets stuck, how the problem is related to her ADHD, and strategies and skills to move.  I provided psycho education around ADHD, typical challenges, skills and strategies for managing her time and tasks more effectively.  We also checked in and d/ an ongoing family dynamic which we identified and are working on to change.   Elder Greening, PhD   Husband: Kelly Myers Sister: Stana Ear Brother-in-law:  Sharin David

## 2023-10-13 ENCOUNTER — Ambulatory Visit: Admitting: Psychology

## 2023-10-18 ENCOUNTER — Other Ambulatory Visit: Payer: Self-pay

## 2023-10-18 MED ORDER — BISOPROLOL FUMARATE 5 MG PO TABS: 5.0000 mg | ORAL_TABLET | Freq: Every day | ORAL | 0 refills | Status: DC

## 2023-10-18 MED ORDER — IRBESARTAN 150 MG PO TABS: 150.0000 mg | ORAL_TABLET | Freq: Every day | ORAL | 0 refills | Status: DC

## 2023-10-19 NOTE — Progress Notes (Signed)
  Cardiology Office Note:  .   Date:  10/26/2023  ID:  Kelly Myers, DOB Aug 20, 1955, MRN [id number] PCP: [name], MD  [facility] Providers Cardiologist:  Debby Sor, MD    History of Present Illness: .   Kelly Myers is a 68 y.o. female with a hx of asthma, endometriosis, hypertension, and hypothyroidism, HLD, Coronary calcium  score 10/2022 26. Sister had MI 97 yr old.  Patient comes in for yearly f/u. She lost her mother and MIL this year. She walks her dog a mile daily. She does yoga regularly twice weekly.    ROS:    Studies Reviewed: SABRA    EKG Interpretation Date/Time:  Tuesday October 26 2023 09:16:45 EDT Ventricular Rate:  79 PR Interval:  172 QRS Duration:  80 QT Interval:  378 QTC Calculation: 433 R Axis:   -1  Text Interpretation: Normal sinus rhythm poor ant R wave progression When compared with ECG of 09/17/22 no change Confirmed by Parthenia Klinefelter [phone]) on 10/26/2023 9:20:32 AM    Prior CV Studies:   Coronary calcium  score 10/2022  FINDINGS: Coronary Calcium  Score:   Left main: 0   Left anterior descending artery: 14.8   Left circumflex artery: 0   Right coronary artery: 12.1   Total: 26.9   Percentile: 61st   Pericardium: Normal.   Ascending Aorta: Normal caliber. Ascending aorta measures approximately 32mm at the mid ascending aorta measured in a non-contrast axial plane.   Non-cardiac: See separate report from [facility].   IMPRESSION: Coronary calcium  score of 26.9. This was 61st percentile for age-, race-, and sex-matched controls.  Risk Assessment/Calculations:             Physical Exam:   VS:  BP 136/70   Pulse 81   Ht 5' 4 (1.626 m)   Wt 136 lb 3.2 oz (61.8 kg)   SpO2 96%   BMI 23.38 kg/m    Orhtostatics: No data found. Wt Readings from Last 3 Encounters:  10/26/23 136 lb 3.2 oz (61.8 kg)  07/12/23 133 lb (60.3 kg)  12/08/22 134 lb (60.8 kg)    GEN: Well nourished, well developed in no acute  distress NECK: No JVD; No carotid bruits CARDIAC: RRR, no murmurs, rubs, gallops RESPIRATORY:  Clear to auscultation without rales, wheezing or rhonchi  ABDOMEN: Soft, non-tender, non-distended EXTREMITIES:  No edema; No deformity   ASSESSMENT AND PLAN: .    Hypertension, well controlled at home BP controlled on bisoprolol  and irbesartan . -Goal BP is <130/80.   -Due for labs   Hyperlipidemia with goal LDL less than 100 Elevated LP(a) -LP(a) 128 09/2022 -Recheck lipids and LP(a). -Coronary calcium  score 26 -on crestor  10 mg daily -went to a dietician last year -refer to lipid clinic  Family History early CAD -sister MI 68 yo.        Dispo: f/u 1 yr Dr. Lonni at [facility], Klinefelter Parthenia, PA-C

## 2023-10-21 ENCOUNTER — Ambulatory Visit: Admitting: Psychology

## 2023-10-21 DIAGNOSIS — F4323 Adjustment disorder with mixed anxiety and depressed mood: Secondary | ICD-10-CM | POA: Diagnosis not present

## 2023-10-21 DIAGNOSIS — F411 Generalized anxiety disorder: Secondary | ICD-10-CM

## 2023-10-21 NOTE — Progress Notes (Signed)
 Progress Note:   Name: Kelly Myers Date: 10/21/2023 MRN: [id number] DOB: 20-Nov-1955 PCP: [name], MD   Time spent: 9:00 AM - 9:58 AM   Today I met in person with [name]  for in office face-to-face individual psychotherapy.   Reason for Visit /Presenting Problem: TOVAH SLAVICK presents after she discovered that her husband has been using heavy drugs (ie., cocaine, Adderall).  Addiction problems have been part of his history (ie., gambling).  She confronted him, asked him to stop, and predictably he wasn't able to.  He also goes through 2 bottles of Vodka a week and often drinks socially.  Kwanza has been feeling depressed and anxious.  She has always been a happy, resourceful and resilient person.  Background History:  Vanice and [name] (69) they have been married for 49 years.  They have two daughters [name] (69) and [name] (40).  [name] lives in [place] and is single.  She works for [name].  [name] lives in [place] and is married to a [employer].  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  Leslyn and [name] are both originally from [place].  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 They both have aging parents. Her parents are divorced.  Mother lives at [place] [facility] and is 62 y.o.  Her father is deceased.  He died when he was 48 and the longest lived [name]  [name] is 41 y.o. and lives at home.  His brother is a [name] and lives with their mother and might be a Chartered loss adjuster.   Mental Status Exam: Appearance:   Casual     Behavior:  Appropriate  Motor:  Normal  Speech/Language:   NA  Affect:  Appropriate  Mood:  anxious and depressed  Thought process:  normal  Thought content:    WNL  Sensory/Perceptual disturbances:    WNL  Orientation:   oriented to person, place, and time/date  Attention:  NA  Concentration:  Good  Memory:  WNL  Fund of knowledge:   Good  Insight:    Good  Judgment:   Good  Impulse Control:  Good   Reported Symptoms:  worrying about different things, not being able to control her worrying, difficulties relaxing, difficulties concentrating, disrupted appetite, difficulties getting motivated or enjoy normally pleasurable things, feeling bad about herself  Risk Assessment: Danger to Self:  No Self-injurious Behavior: No Danger to Others: No Duty to Warn:no Physical Aggression / Violence:No  Access to Firearms a concern: No  Gang Involvement:No .  Substance Abuse History: Current substance abuse: No     Past Psychiatric History:   Previous psychological history is significant for n/a Outpatient Providers:  History of Psych Hospitalization: No  Psychological Testing: n/a   Abuse History:  Victim of: No., n/a   Report needed: No. Victim of Neglect:No. Perpetrator of n/a  Witness / Exposure to Domestic Violence: No   Protective Services Involvement: No  Witness to [employer] Violence:  No   Family History:  Family History  Problem Relation Age of Onset   Hypertension Mother    Hypertension Father    Cancer Father        lung   Hypertension Sister    Heart disease Sister  Heart attack Sister    Heart disease Maternal Grandfather      Medical History/Surgical History: reviewed Past Medical History:  Diagnosis Date   Asthma    Asthma cough- meds control   Atrophic vaginitis    Bleeds easily (HCC)    have had clotting studies, tests, I'm within normal limits (02/12/2016)   Endometriosis    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 EYE ALLERGIES   History of blood transfusion 1984   following childbirth.   History of radiation therapy 1963   after adenoid surgery x 2, they grew back and she was treated w/ XRT to the throat   Hypertension    Hypothyroidism    Osteopenia 05/2018    T score -1.4   Pneumonia    as a child (02/12/2016)   Thyroid  nodule    Uterine prolapse     Past Surgical History:  Procedure Laterality Date   ADENOIDECTOMY  1960; 1963   ANTERIOR AND POSTERIOR VAGINAL REPAIR  2000s   BLADDER SUSPENSION  2000s   CESAREAN SECTION  1984   COLONOSCOPY WITH PROPOFOL  N/A 11/04/2015   Procedure: COLONOSCOPY WITH PROPOFOL ;  Surgeon: Gladis MARLA Louder, MD;  Location: [facility];  Service: Endoscopy;  Laterality: N/A;   TUBAL LIGATION  1984   VAGINAL HYSTERECTOMY  2000s   w/bladder suspension and A/P repair   WISDOM TOOTH EXTRACTION  1974    Medications: Current Outpatient Medications  Medication Sig Dispense Refill   bisoprolol  (ZEBETA ) 5 MG tablet Take 1 tablet (5 mg total) by mouth daily. 90 tablet 0   Calcium  Carbonate-Vitamin D  (CALCIUM  + D PO) Take 1 tablet by mouth daily.     Cetirizine HCl 10 MG CAPS Take 10 mg by mouth daily.     Estradiol  10 MCG TABS vaginal tablet Place 1 tablet (10 mcg total) vaginally 3 (three) times a week. 36 tablet 3   fluticasone  (FLONASE ) 50 MCG/ACT nasal spray Place 2 sprays into both nostrils daily.     glucosamine-chondroitin 500-400 MG tablet Take 1 tablet by mouth daily.      irbesartan  (AVAPRO ) 150 MG tablet Take 1 tablet (150 mg total) by mouth daily. 90 tablet 0   levothyroxine  (SYNTHROID , LEVOTHROID) 100 MCG tablet Take 1 tablet (100 mcg total) by mouth daily before breakfast. 30 tablet 0   montelukast  (SINGULAIR ) 10 MG tablet Take 10 mg by mouth at bedtime.     Nutritional Supplements (ESTROVEN PO) Take by mouth.     Oyster Shell Calcium  500 MG TABS one tab twice daily Oral     Probiotic Product (PROBIOTIC DAILY PO) Take 1 capsule by mouth daily.      rosuvastatin  (CRESTOR ) 10 MG tablet Take 1 tablet (10 mg total) by mouth daily. 90 tablet 0   VENTOLIN HFA 108 (90 Base) MCG/ACT inhaler   0   XIIDRA 5 % SOLN Apply to eye 2 (two) times daily.     No current facility-administered medications for this  visit.    Allergies  Allergen Reactions   Metoprolol      Irritates asthma   Macrodantin [Nitrofurantoin] Hives   Neosporin [Neomycin-Bacitracin Zn-Polymyx] Rash     Individualized Treatment Plan        Strengths: strong family connections, helpful, caring, optimistic  Supports: adult children, sister   Goal/Needs for Treatment:  In order of importance to patient 1) Learn and implement skills and strategies to manage anxiety 2) Learn and implement skills and strategies to manage depression 3) Understand  the impact her family role has had in her life, and learn how to separate and pursue her own interests/goals 4) Learn how to be more assertive (ie., set limits and boundaries) in a range of settings and experiences   Client Statement of Needs: Learn life skills that she feels she doesn't have, learning to be more open, honest and rely on other people, learn to be more her own person with her own friends and interests,    Treatment Level: Weekly Outpatient Individual Psychotherapy  Symptoms: anxious, can't stop worrying, not able to let things go, distracted, difficulty sleeping, depressed,   Client Treatment Preferences:  Referred by Dr. Coni   Healthcare consumer's goal for treatment:  Psychologist, Ronal Jenkins Sprung, Ph.D  will support the patient's ability to achieve the goals identified. Cognitive Behavioral Therapy, Dialectical Behavioral Therapy, Motivational Interviewing, Behavioral Motivation, and other evidenced-based practices will be used to promote progress towards healthy functioning.   Healthcare consumer [name]  will: Actively participate in therapy, working towards healthy functioning.    *Justification for Continuation/Discontinuation of Goal: R=Revised, O=Ongoing, A=Achieved, D=Discontinued  Goal 1) Learn and implement skills and strategies to manage anxiety  Likert rating baseline date: 05/06/23 Target Date Goal Was reviewed Status Code Progress  towards goal/Likert rating  05/05/2024           O              Goal 2) Learn and implement skills and strategies to manage depression  Likert rating baseline date: 05/06/23 Target Date Goal Was reviewed Status Code Progress towards goal/Likert rating  05/05/2024           O              Goal 3) Understand the impact her family role has had in her life, and learn how to separate and pursue her own interests/goals  Likert rating baseline date: 05/06/23 Target Date Goal Was reviewed Status Code Progress towards goal/Likert rating  05/05/2024           O              Goal 4) Learn how to be more assertive (ie., set limits and boundaries) in a range of settings and experiences  5 Point Likert rating baseline date: 05/06/23 Target Date Goal Was reviewed Status Code Progress towards goal/Likert rating  05/05/2024            O                This plan has been reviewed and created by the following participants:  This plan will be reviewed at least every 12 months. Date Behavioral Health Clinician Date Guardian/Patient   05/06/2023 Ronal Jenkins Sprung, Ph.D.  05/06/2023 Santa LITTIE. Vigilante                   Diagnoses:  Adjustment Disorder with mixed anxious and depressed mood   Amandalee reports that her dearest friend's (since middle school) husband died suddenly.  We d/ what occurred, making the decision to fly out to [facility], how she managed her anxiety, and getting better at recognizing her anxiety and how it interferes with her life.  Kensy is also becoming more aware of how pervasive certain family dynamics are, and is catching herself before reacting in typical ways.  We d/ these dynamics, how they have planned out recently, and how she's managed to respond in a new manner.  Ronal Jenkins Sprung, PhD   Husband: [name]  Sister: Rock Brother-in-law: Dick

## 2023-10-26 ENCOUNTER — Ambulatory Visit: Attending: Cardiology | Admitting: Physician Assistant

## 2023-10-26 ENCOUNTER — Encounter: Payer: Self-pay | Admitting: Physician Assistant

## 2023-10-26 ENCOUNTER — Ambulatory Visit: Admitting: Physician Assistant

## 2023-10-26 VITALS — BP 136/70 | HR 81 | Ht 64.0 in | Wt 136.2 lb

## 2023-10-26 DIAGNOSIS — Z8249 Family history of ischemic heart disease and other diseases of the circulatory system: Secondary | ICD-10-CM | POA: Diagnosis not present

## 2023-10-26 DIAGNOSIS — I10 Essential (primary) hypertension: Secondary | ICD-10-CM | POA: Diagnosis not present

## 2023-10-26 DIAGNOSIS — E785 Hyperlipidemia, unspecified: Secondary | ICD-10-CM | POA: Diagnosis not present

## 2023-10-26 NOTE — Patient Instructions (Signed)
 Medication Instructions:  Your physician recommends that you continue on your current medications as directed. Please refer to the Current Medication list given to you today.  *If you need a refill on your cardiac medications before your next appointment, please call your pharmacy*  Lab Work: GO TO A [facility] NEAR YOU, FASTING, FOR:  CMET, CBC, LPA, AND LIPID  If you have labs (blood work) drawn today and your tests are completely normal, you will receive your results only by: MyChart Message (if you have MyChart) OR A paper copy in the mail If you have any lab test that is abnormal or we need to change your treatment, we will call you to review the results.  Testing/Procedures: None ordered  Follow-Up: At [facility], you and your health needs are our priority.  As part of our continuing mission to provide you with exceptional heart care, our providers are all part of one team.  This team includes your primary Cardiologist (physician) and Advanced Practice Providers or APPs (Physician Assistants and Nurse Practitioners) who all work together to provide you with the care you need, when you need it.  Your next appointment:   1 year(s)  Provider:   Shelda Bruckner, MD    We recommend signing up for the patient portal called MyChart.  Sign up information is provided on this After Visit Summary.  MyChart is used to connect with patients for Virtual Visits (Telemedicine).  Patients are able to view lab/test results, encounter notes, upcoming appointments, etc.  Non-urgent messages can be sent to your provider as well.   To learn more about what you can do with MyChart, go to [email or web address].   Other Instructions

## 2023-10-27 ENCOUNTER — Ambulatory Visit: Admitting: Psychology

## 2023-11-17 ENCOUNTER — Ambulatory Visit (INDEPENDENT_AMBULATORY_CARE_PROVIDER_SITE_OTHER): Admitting: Psychology

## 2023-11-17 DIAGNOSIS — F411 Generalized anxiety disorder: Secondary | ICD-10-CM

## 2023-11-17 DIAGNOSIS — F4323 Adjustment disorder with mixed anxiety and depressed mood: Secondary | ICD-10-CM | POA: Diagnosis not present

## 2023-11-17 NOTE — Progress Notes (Signed)
 Progress Note:   Name: Kelly Myers Date: 11/17/2023 MRN: [id number] DOB: [birth year] PCP: [name], MD   Time spent: 11:00 AM - 11:58 AM   Today I met in person with Kelly [place]  for in office face-to-face individual psychotherapy.   Reason for Visit /Presenting Problem: Kelly [place] presents after she discovered that her husband has been using heavy drugs (ie., cocaine, Adderall).  Addiction problems have been part of his history (ie., gambling).  She confronted him, asked him to stop, and predictably he wasn't able to.  He also goes through 2 bottles of Vodka a week and often drinks socially.  Kelly Myers has been feeling depressed and anxious.  She has always been a happy, resourceful and resilient person.  Background History:  Kelly [place] and [place] (71) they have been married for 49 years.  They have two daughters Kelly Myers (34) and Kelly Myers (40).  Kelly Myers lives in [place] and is single.  She works for [name].  Kelly Myers lives in [place] and is married to a [employer].  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  [place] and [place] are both originally from [place].  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 They both have aging parents. Her parents are divorced.  Mother lives at [facility] and is 23 y.o.  Her father is deceased.  He died when he was 38 and the longest lived Kelly Myers's disease pt.  Kelly Myers's mother is 47 y.o. and lives at home.  His brother is a [name] and lives with their mother and might be a Chartered loss adjuster.   Mental Status Exam: Appearance:   Casual     Behavior:  Appropriate  Motor:  Normal  Speech/Language:   NA  Affect:  Appropriate  Mood:  anxious and depressed  Thought process:  normal  Thought content:    WNL  Sensory/Perceptual disturbances:    WNL  Orientation:   oriented to person, place, and time/date  Attention:  NA  Concentration:  Good  Memory:  WNL  Fund of knowledge:   Good  Insight:    Good  Judgment:   Good  Impulse Control:  Good   Reported Symptoms:  worrying about different things, not being able to control her worrying, difficulties relaxing, difficulties concentrating, disrupted appetite, difficulties getting motivated or enjoy normally pleasurable things, feeling bad about herself  Risk Assessment: Danger to Self:  No Self-injurious Behavior: No Danger to Others: No Duty to Warn:no Physical Aggression / Violence:No  Access to Firearms a concern: No  Gang Involvement:No .  Substance Abuse History: Current substance abuse: No     Past Psychiatric History:   Previous psychological history is significant for n/a Outpatient Providers:  History of Psych Hospitalization: No  Psychological Testing: n/a   Abuse History:  Victim of: No., n/a   Report needed: No. Victim of Neglect:No. Perpetrator of n/a  Witness / Exposure to Domestic Violence: No   Protective Services Involvement: No  Witness to [employer] Violence:  No   Family History:  Family History  Problem Relation Age of Onset   Hypertension Mother    Hypertension Father    Cancer Father        lung   Hypertension Sister    Heart disease Sister  Heart attack Sister    Heart disease Maternal Grandfather      Medical History/Surgical History: reviewed Past Medical History:  Diagnosis Date   Asthma    Asthma cough- meds control   Atrophic vaginitis    Bleeds easily (HCC)    have had clotting studies, tests, I'm within normal limits (02/12/2016)   Endometriosis    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 EYE ALLERGIES   History of blood transfusion 1984   following childbirth.   History of radiation therapy 1963   after adenoid surgery x 2, they grew back and she was treated w/ XRT to the throat   Hypertension    Hypothyroidism    Osteopenia 05/2018    T score -1.4   Pneumonia    as a child (02/12/2016)   Thyroid  nodule    Uterine prolapse     Past Surgical History:  Procedure Laterality Date   ADENOIDECTOMY  1960; 1963   ANTERIOR AND POSTERIOR VAGINAL REPAIR  2000s   BLADDER SUSPENSION  2000s   CESAREAN SECTION  1984   COLONOSCOPY WITH PROPOFOL  N/A 11/04/2015   Procedure: COLONOSCOPY WITH PROPOFOL ;  Surgeon: Gladis MARLA Louder, MD;  Location: [facility];  Service: Endoscopy;  Laterality: N/A;   TUBAL LIGATION  1984   VAGINAL HYSTERECTOMY  2000s   w/bladder suspension and A/P repair   WISDOM TOOTH EXTRACTION  1974    Medications: Current Outpatient Medications  Medication Sig Dispense Refill   bisoprolol  (ZEBETA ) 5 MG tablet Take 1 tablet (5 mg total) by mouth daily. 90 tablet 0   Calcium  Carbonate-Vitamin D  (CALCIUM  + D PO) Take 1 tablet by mouth daily.     Cetirizine HCl 10 MG CAPS Take 10 mg by mouth daily.     Estradiol  10 MCG TABS vaginal tablet Place 1 tablet (10 mcg total) vaginally 3 (three) times a week. 36 tablet 3   fluticasone  (FLONASE ) 50 MCG/ACT nasal spray Place 2 sprays into both nostrils daily.     glucosamine-chondroitin 500-400 MG tablet Take 1 tablet by mouth daily.      irbesartan  (AVAPRO ) 150 MG tablet Take 1 tablet (150 mg total) by mouth daily. 90 tablet 0   levothyroxine  (SYNTHROID , LEVOTHROID) 100 MCG tablet Take 1 tablet (100 mcg total) by mouth daily before breakfast. 30 tablet 0   montelukast  (SINGULAIR ) 10 MG tablet Take 10 mg by mouth at bedtime.     Nutritional Supplements (ESTROVEN PO) Take by mouth.     rosuvastatin  (CRESTOR ) 10 MG tablet Take 1 tablet (10 mg total) by mouth daily. 90 tablet 0   VENTOLIN HFA 108 (90 Base) MCG/ACT inhaler   0   No current facility-administered medications for this visit.    Allergies  Allergen Reactions   Metoprolol      Irritates asthma   Macrodantin [Nitrofurantoin] Hives   Neosporin [Neomycin-Bacitracin Zn-Polymyx] Rash     Individualized  Treatment Plan        Strengths: strong family connections, helpful, caring, optimistic  Supports: adult children, sister   Goal/Needs for Treatment:  In order of importance to patient 1) Learn and implement skills and strategies to manage anxiety 2) Learn and implement skills and strategies to manage depression 3) Understand the impact her family role has had in her life, and learn how to separate and pursue her own interests/goals 4) Learn how to be more assertive (ie., set limits and boundaries) in a range of settings and experiences   Client Statement of Needs: Learn  life skills that she feels she doesn't have, learning to be more open, honest and rely on other people, learn to be more her own person with her own friends and interests,    Treatment Level: Weekly Outpatient Individual Psychotherapy  Symptoms: anxious, can't stop worrying, not able to let things go, distracted, difficulty sleeping, depressed,   Client Treatment Preferences:  Referred by Dr. Coni   Healthcare consumer's goal for treatment:  Psychologist, Ronal Jenkins Sprung, Ph.D  will support the patient's ability to achieve the goals identified. Cognitive Behavioral Therapy, Dialectical Behavioral Therapy, Motivational Interviewing, Behavioral Motivation, and other evidenced-based practices will be used to promote progress towards healthy functioning.   Healthcare consumer [place]  will: Actively participate in therapy, working towards healthy functioning.    *Justification for Continuation/Discontinuation of Goal: R=Revised, O=Ongoing, A=Achieved, D=Discontinued  Goal 1) Learn and implement skills and strategies to manage anxiety  Likert rating baseline date: 05/06/23 Target Date Goal Was reviewed Status Code Progress towards goal/Likert rating  05/05/2024           O              Goal 2) Learn and implement skills and strategies to manage depression  Likert rating baseline date: 05/06/23 Target Date  Goal Was reviewed Status Code Progress towards goal/Likert rating  05/05/2024           O              Goal 3) Understand the impact her family role has had in her life, and learn how to separate and pursue her own interests/goals  Likert rating baseline date: 05/06/23 Target Date Goal Was reviewed Status Code Progress towards goal/Likert rating  05/05/2024           O              Goal 4) Learn how to be more assertive (ie., set limits and boundaries) in a range of settings and experiences  5 Point Likert rating baseline date: 05/06/23 Target Date Goal Was reviewed Status Code Progress towards goal/Likert rating  05/05/2024            O                This plan has been reviewed and created by the following participants:  This plan will be reviewed at least every 12 months. Date Behavioral Health Clinician Date Guardian/Patient   05/06/2023 Ronal Jenkins Sprung, Ph.D.  05/06/2023 [place]                   Diagnoses:  Adjustment Disorder with mixed anxious and depressed mood   Miriam reports that she had a nice vacation in the [address]  We d/e/p the kinds of anxieties she has to navigate as she prepares for travel, when she is traveling, and learning to allow and enjoy some spontaneity.  We also d/p an issues she had with her husband near the end of the trip that made her both anxious and disappointed.  I noted that Jamella is learning to speak up in a more timely manner as oppose to her old pattern of holding on to grievances and then dumping it all later.  She too is noticing the ways in which her behavior is changing, feeling anxious less often and with a lessened intensity, and is pleased with the progress she is making.  Ronal Jenkins Sprung, PhD   Husband: [place] Sister: Rock Brother-in-law: Dick

## 2023-11-25 DIAGNOSIS — Z8249 Family history of ischemic heart disease and other diseases of the circulatory system: Secondary | ICD-10-CM | POA: Diagnosis not present

## 2023-11-25 DIAGNOSIS — E785 Hyperlipidemia, unspecified: Secondary | ICD-10-CM | POA: Diagnosis not present

## 2023-11-25 DIAGNOSIS — I10 Essential (primary) hypertension: Secondary | ICD-10-CM | POA: Diagnosis not present

## 2023-11-26 ENCOUNTER — Ambulatory Visit: Payer: Self-pay | Admitting: Physician Assistant

## 2023-11-26 LAB — LIPID PANEL
Chol/HDL Ratio: 1.9 ratio (ref 0.0–4.4)
Cholesterol, Total: 137 mg/dL (ref 100–199)
HDL: 72 mg/dL (ref >39)
LDL Chol Calc (NIH): 54 mg/dL (ref 0–99)
Triglycerides: 51 mg/dL (ref 0–149)
VLDL Cholesterol Cal: 11 mg/dL (ref 5–40)

## 2023-11-26 LAB — CBC
Hematocrit: 42.6 % (ref 34.0–46.6)
Hemoglobin: 14.2 g/dL (ref 11.1–15.9)
MCH: 30.4 pg (ref 26.6–33.0)
MCHC: 33.3 g/dL (ref 31.5–35.7)
MCV: 91 fL (ref 79–97)
Platelets: 160 x10E3/uL (ref 150–450)
RBC: 4.67 x10E6/uL (ref 3.77–5.28)
RDW: 12.8 % (ref 11.7–15.4)
WBC: 4.9 x10E3/uL (ref 3.4–10.8)

## 2023-11-26 LAB — COMPREHENSIVE METABOLIC PANEL WITH GFR
ALT: 34 IU/L — ABNORMAL HIGH (ref 0–32)
AST: 34 IU/L (ref 0–40)
Albumin: 4.8 g/dL (ref 3.9–4.9)
Alkaline Phosphatase: 83 IU/L (ref 44–121)
BUN/Creatinine Ratio: 17 (ref 12–28)
BUN: 14 mg/dL (ref 8–27)
Bilirubin Total: 0.8 mg/dL (ref 0.0–1.2)
CO2: 22 mmol/L (ref 20–29)
Calcium: 9.8 mg/dL (ref 8.7–10.3)
Chloride: 98 mmol/L (ref 96–106)
Creatinine, Ser: 0.82 mg/dL (ref 0.57–1.00)
Globulin, Total: 2.3 g/dL (ref 1.5–4.5)
Glucose: 89 mg/dL (ref 70–99)
Potassium: 4.5 mmol/L (ref 3.5–5.2)
Sodium: 136 mmol/L (ref 134–144)
Total Protein: 7.1 g/dL (ref 6.0–8.5)
eGFR: 77 mL/min/1.73 (ref >59)

## 2023-11-26 LAB — LIPOPROTEIN A (LPA): Lipoprotein (a): 125.5 nmol/L — ABNORMAL HIGH (ref <75.0)

## 2023-12-01 ENCOUNTER — Ambulatory Visit (INDEPENDENT_AMBULATORY_CARE_PROVIDER_SITE_OTHER): Admitting: Psychology

## 2023-12-01 DIAGNOSIS — F411 Generalized anxiety disorder: Secondary | ICD-10-CM

## 2023-12-01 DIAGNOSIS — F4323 Adjustment disorder with mixed anxiety and depressed mood: Secondary | ICD-10-CM | POA: Diagnosis not present

## 2023-12-01 NOTE — Progress Notes (Signed)
 Progress Note:   Name: Kelly Myers Date: 12/01/2023 MRN: [id number] DOB: 1955-05-18 PCP: [name], MD   Time spent: 11:01 AM - 11:59 AM   Today I met in person with Kelly Myers  for in office face-to-face individual psychotherapy.   Reason for Visit /Presenting Problem: Kelly Myers presents after she discovered that her husband has been using heavy drugs (ie., cocaine, Adderall).  Addiction problems have been part of his history (ie., gambling).  She confronted him, asked him to stop, and predictably he wasn't able to.  He also goes through 2 bottles of Vodka a week and often drinks socially.  Kelly Myers has been feeling depressed and anxious.  She has always been a happy, resourceful and resilient person.  Background History:  [name] and Kelly Myers (71) they have been married for 49 years.  They have two daughters Kelly Myers (41) and Kelly Myers (40).  Kelly Myers lives in [place] and is single.  She works for [name].  Kelly Myers lives in [place] and is married to a [employer].  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  Kelly Myers and Kelly Myers are both originally from [place].  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 They both have aging parents. Her parents are divorced.  Mother lives at [place] and is 77 y.o.  Her father is deceased.  He died when he was 105 and the longest lived Kelly Myers's disease pt.  Kelly Myers's mother is 45 y.o. and lives at home.  His brother is a [name] and lives with their mother and might be a Chartered loss adjuster.   Mental Status Exam: Appearance:   Casual     Behavior:  Appropriate  Motor:  Normal  Speech/Language:   NA  Affect:  Appropriate  Mood:  anxious and depressed  Thought process:  normal  Thought content:    WNL  Sensory/Perceptual disturbances:    WNL  Orientation:   oriented to person, place, and time/date  Attention:  NA  Concentration:  Good  Memory:  WNL  Fund of knowledge:   Good  Insight:    Good  Judgment:   Good  Impulse Control:  Good   Reported Symptoms:  worrying about different things, not being able to control her worrying, difficulties relaxing, difficulties concentrating, disrupted appetite, difficulties getting motivated or enjoy normally pleasurable things, feeling bad about herself  Risk Assessment: Danger to Self:  No Self-injurious Behavior: No Danger to Others: No Duty to Warn:no Physical Aggression / Violence:No  Access to Firearms a concern: No  Gang Involvement:No .  Substance Abuse History: Current substance abuse: No     Past Psychiatric History:   Previous psychological history is significant for n/a Outpatient Providers:  History of Psych Hospitalization: No  Psychological Testing: n/a   Abuse History:  Victim of: No., n/a   Report needed: No. Victim of Neglect:No. Perpetrator of n/a  Witness / Exposure to Domestic Violence: No   Protective Services Involvement: No  Witness to [employer] Violence:  No   Family History:  Family History  Problem Relation Age of Onset   Hypertension Mother    Hypertension Father    Cancer Father        lung   Hypertension Sister    Heart disease Sister  Heart attack Sister    Heart disease Maternal Grandfather      Medical History/Surgical History: reviewed Past Medical History:  Diagnosis Date   Asthma    Asthma cough- meds control   Atrophic vaginitis    Bleeds easily (HCC)    have had clotting studies, tests, I'm within normal limits (02/12/2016)   Endometriosis    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 EYE ALLERGIES   History of blood transfusion 1984   following childbirth.   History of radiation therapy 1963   after adenoid surgery x 2, they grew back and she was treated w/ XRT to the throat   Hypertension    Hypothyroidism    Osteopenia 05/2018    T score -1.4   Pneumonia    as a child (02/12/2016)   Thyroid  nodule    Uterine prolapse     Past Surgical History:  Procedure Laterality Date   ADENOIDECTOMY  1960; 1963   ANTERIOR AND POSTERIOR VAGINAL REPAIR  2000s   BLADDER SUSPENSION  2000s   CESAREAN SECTION  1984   COLONOSCOPY WITH PROPOFOL  N/A 11/04/2015   Procedure: COLONOSCOPY WITH PROPOFOL ;  Surgeon: Gladis MARLA Louder, MD;  Location: [facility];  Service: Endoscopy;  Laterality: N/A;   TUBAL LIGATION  1984   VAGINAL HYSTERECTOMY  2000s   w/bladder suspension and A/P repair   WISDOM TOOTH EXTRACTION  1974    Medications: Current Outpatient Medications  Medication Sig Dispense Refill   bisoprolol  (ZEBETA ) 5 MG tablet Take 1 tablet (5 mg total) by mouth daily. 90 tablet 0   Calcium  Carbonate-Vitamin D  (CALCIUM  + D PO) Take 1 tablet by mouth daily.     Cetirizine HCl 10 MG CAPS Take 10 mg by mouth daily.     Estradiol  10 MCG TABS vaginal tablet Place 1 tablet (10 mcg total) vaginally 3 (three) times a week. 36 tablet 3   fluticasone  (FLONASE ) 50 MCG/ACT nasal spray Place 2 sprays into both nostrils daily.     glucosamine-chondroitin 500-400 MG tablet Take 1 tablet by mouth daily.      irbesartan  (AVAPRO ) 150 MG tablet Take 1 tablet (150 mg total) by mouth daily. 90 tablet 0   levothyroxine  (SYNTHROID , LEVOTHROID) 100 MCG tablet Take 1 tablet (100 mcg total) by mouth daily before breakfast. 30 tablet 0   montelukast  (SINGULAIR ) 10 MG tablet Take 10 mg by mouth at bedtime.     Nutritional Supplements (ESTROVEN PO) Take by mouth.     rosuvastatin  (CRESTOR ) 10 MG tablet Take 1 tablet (10 mg total) by mouth daily. 90 tablet 0   VENTOLIN HFA 108 (90 Base) MCG/ACT inhaler   0   No current facility-administered medications for this visit.    Allergies  Allergen Reactions   Metoprolol      Irritates asthma   Macrodantin [Nitrofurantoin] Hives   Neosporin [Neomycin-Bacitracin Zn-Polymyx] Rash     Individualized  Treatment Plan        Strengths: strong family connections, helpful, caring, optimistic  Supports: adult children, sister   Goal/Needs for Treatment:  In order of importance to patient 1) Learn and implement skills and strategies to manage anxiety 2) Learn and implement skills and strategies to manage depression 3) Understand the impact her family role has had in her life, and learn how to separate and pursue her own interests/goals 4) Learn how to be more assertive (ie., set limits and boundaries) in a range of settings and experiences   Client Statement of Needs: Learn  life skills that she feels she doesn't have, learning to be more open, honest and rely on other people, learn to be more her own person with her own friends and interests,    Treatment Level: Weekly Outpatient Individual Psychotherapy  Symptoms: anxious, can't stop worrying, not able to let things go, distracted, difficulty sleeping, depressed,   Client Treatment Preferences:  Referred by Dr. Coni   Healthcare consumer's goal for treatment:  Psychologist, Ronal Jenkins Sprung, Ph.D  will support the patient's ability to achieve the goals identified. Cognitive Behavioral Therapy, Dialectical Behavioral Therapy, Motivational Interviewing, Behavioral Motivation, and other evidenced-based practices will be used to promote progress towards healthy functioning.   Healthcare consumer Kelly Myers  will: Actively participate in therapy, working towards healthy functioning.    *Justification for Continuation/Discontinuation of Goal: R=Revised, O=Ongoing, A=Achieved, D=Discontinued  Goal 1) Learn and implement skills and strategies to manage anxiety  Likert rating baseline date: 05/06/23 Target Date Goal Was reviewed Status Code Progress towards goal/Likert rating  05/05/2024           O              Goal 2) Learn and implement skills and strategies to manage depression  Likert rating baseline date: 05/06/23 Target Date  Goal Was reviewed Status Code Progress towards goal/Likert rating  05/05/2024           O              Goal 3) Understand the impact her family role has had in her life, and learn how to separate and pursue her own interests/goals  Likert rating baseline date: 05/06/23 Target Date Goal Was reviewed Status Code Progress towards goal/Likert rating  05/05/2024           O              Goal 4) Learn how to be more assertive (ie., set limits and boundaries) in a range of settings and experiences  5 Point Likert rating baseline date: 05/06/23 Target Date Goal Was reviewed Status Code Progress towards goal/Likert rating  05/05/2024            O                This plan has been reviewed and created by the following participants:  This plan will be reviewed at least every 12 months. Date Behavioral Health Clinician Date Guardian/Patient   05/06/2023 Ronal Jenkins Sprung, Ph.D.  05/06/2023 Kelly LITTIE. [place]                   Diagnoses:  Adjustment Disorder with mixed anxious and depressed mood   Zylie reports that she has made an effort to make plans with friends she hasn't seen in a while.  We d/e/p the plans she made, her history with these friends, and   We also d/e/p that she is feeling more distant from her sisters since her mother passed away, not having anyone to talk to about her loss, and feeling alone.  We d/e/p the ways her relationship with her sisters is also different because she is reacting and responding differently to them, the ways in which this is all bad, and that she can be more direct in asking for what she needs (instead of passively waiting for them to figure it out).  Ronal Jenkins Sprung, PhD   Husband: Kelly Myers Sister: Rock Brother-in-law: Dick

## 2023-12-07 ENCOUNTER — Other Ambulatory Visit: Payer: Self-pay | Admitting: Physician Assistant

## 2023-12-10 NOTE — Progress Notes (Unsigned)
 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 female patient of Dr Bruckner, in the office today to discuss options for cholesterol management.  Currently her LDL is at goal (54), but she has known family history of elevated Lp(a) levels as well as coronary disease.  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   Insurance Carrier:  HTA Plan 1     LDL Cholesterol goal:  LDL < 70  Current Medications:   rosuvastatin  10  Family Hx: mother had elevated Lp(a), died this year at 29; sister had first MI at 31 second at 47, stenting with each, Lp(a) was elevated, taking Praluent since second MI now 73;  daughter 37 with normal Lp(a)  Social Hx: Tobacco:no Alcohol: wine about 3 times a week, occasional cocktails    Diet: trying to eat high protein, low calorie, peanut butter toast, eggs/omelettes, meats chicken, fish; some vegetables; avoids snakcing, leans to cheese/ and crackers if needed or nuts     Exercise: yoga 2-3 times per week, mixed withg class on Fridays, walks dog 1-2 times daily    Accessory Clinical Findings   Lab Results  Component Value Date   CHOL 137 11/25/2023    HDL 72 11/25/2023   LDLCALC 54 11/25/2023   TRIG 51 11/25/2023   CHOLHDL 1.9 11/25/2023    Lipoprotein (a)  Date/Time Value Ref Range Status  11/25/2023 09:10 AM 125.5 (H) <75.0 nmol/L Final    Comment:    Note:  Values greater than or equal to 75.0 nmol/L may        indicate an independent risk factor for CHD,        but must be evaluated with caution when applied        to non-Caucasian populations due to the        influence of genetic factors on Lp(a) across        ethnicities.     Lab Results  Component Value Date   ALT 34 (H) 11/25/2023   AST 34 11/25/2023   ALKPHOS 83 11/25/2023   BILITOT 0.8 11/25/2023   Lab Results  Component Value Date   CREATININE 0.82 11/25/2023   BUN 14 11/25/2023   NA 136 11/25/2023   K 4.5 11/25/2023   CL 98 11/25/2023   CO2 22 11/25/2023   No results found for: HGBA1C  Home Medications    Current Outpatient Medications  Medication Sig Dispense Refill   bisoprolol  (ZEBETA ) 5 MG tablet Take 1 tablet (5 mg total) by mouth daily. 90 tablet 0   Calcium  Carbonate-Vitamin D  (CALCIUM  + D PO) Take 1 tablet by mouth daily.     Cetirizine  HCl 10 MG CAPS Take 10 mg by mouth daily.     Estradiol  10 MCG TABS vaginal tablet Place 1 tablet (10 mcg total) vaginally 3 (three) times a week. 36 tablet 3   fluticasone  (FLONASE ) 50 MCG/ACT nasal spray Place 2 sprays into both nostrils daily.     glucosamine-chondroitin 500-400 MG tablet Take 1 tablet by mouth daily.      irbesartan  (AVAPRO ) 150 MG tablet Take 1 tablet (150 mg total) by mouth daily. 90 tablet 0   levothyroxine  (SYNTHROID , LEVOTHROID) 100 MCG tablet Take 1 tablet (100 mcg total) by mouth daily before breakfast. 30 tablet 0   montelukast  (SINGULAIR ) 10 MG tablet Take 10 mg by mouth at bedtime.     Nutritional Supplements (ESTROVEN PO) Take by mouth.     rosuvastatin  (CRESTOR ) 10 MG tablet Take 1 tablet (10 mg total) by mouth daily. 90 tablet 1   VENTOLIN HFA 108 (90 Base) MCG/ACT inhaler    0   No current facility-administered medications for this visit.     Assessment & Plan    Hyperlipidemia LDL goal <70 Assessment: Patient with ASCVD at LDL goal of < 70 Most recent LDL 54 on 11/25/23 Has been compliant with moderate intensity statin: rosuvastatin  10 mg Explained that no current medications for Lp(a) lowering, although we have seen lowering with Repatha and Leqvio.  Reviewed options for lowering LDL cholesterol, including PCSK-9 inhibitors and inclisiran.  Discussed mechanisms of action, dosing, side effects, potential decreases in LDL cholesterol and costs.  Also reviewed potential options for patient assistance.  Plan: Patient will continue with rosuvastatin  for now.  Will review information in early 2026 to see if she would like to try switching to Repatha/Leqvio with potential Lp(a) lowering.     Easton Fetty, PharmD CPP [facility] [address]   [place], [place] [address] [phone]  12/15/2023, 12:04 PM

## 2023-12-14 ENCOUNTER — Ambulatory Visit: Attending: Cardiology | Admitting: Pharmacist Clinician (PhC)/ Clinical Pharmacy Specialist

## 2023-12-14 DIAGNOSIS — E785 Hyperlipidemia, unspecified: Secondary | ICD-10-CM

## 2023-12-14 NOTE — Patient Instructions (Signed)
 Your Results:             Your most recent labs Goal  Total Cholesterol 137 < 200  Triglycerides 51 < 150  HDL (happy/good cholesterol) 72 > 40  LDL (lousy/bad cholesterol 54 < 70   I will reach out to you after the first of the year to see if you want to try stopping the rosuvastatin  and trying Repatha/Praluent.   If you have any questions or concerns before then, you can send me a MyChart message   Thank you for choosing [facility]  Allean Mink PharmD

## 2023-12-15 ENCOUNTER — Encounter: Payer: Self-pay | Admitting: Pharmacist Clinician (PhC)/ Clinical Pharmacy Specialist

## 2023-12-15 DIAGNOSIS — E785 Hyperlipidemia, unspecified: Secondary | ICD-10-CM | POA: Insufficient documentation

## 2023-12-15 NOTE — Assessment & Plan Note (Signed)
 Assessment: Patient with ASCVD at LDL goal of < 70 Most recent LDL 54 on 11/25/23 Has been compliant with moderate intensity statin: rosuvastatin  10 mg Explained that no current medications for Lp(a) lowering, although we have seen lowering with Repatha and Leqvio.  Reviewed options for lowering LDL cholesterol, including PCSK-9 inhibitors and inclisiran.  Discussed mechanisms of action, dosing, side effects, potential decreases in LDL cholesterol and costs.  Also reviewed potential options for patient assistance.  Plan: Patient will continue with rosuvastatin  for now.  Will review information in early 2026 to see if she would like to try switching to Repatha/Leqvio with potential Lp(a) lowering.

## 2023-12-20 DIAGNOSIS — L719 Rosacea, unspecified: Secondary | ICD-10-CM | POA: Diagnosis not present

## 2023-12-20 DIAGNOSIS — H01111 Allergic dermatitis of right upper eyelid: Secondary | ICD-10-CM | POA: Diagnosis not present

## 2023-12-20 DIAGNOSIS — L710 Perioral dermatitis: Secondary | ICD-10-CM | POA: Diagnosis not present

## 2023-12-21 DIAGNOSIS — H25813 Combined forms of age-related cataract, bilateral: Secondary | ICD-10-CM | POA: Diagnosis not present

## 2023-12-21 DIAGNOSIS — H10413 Chronic giant papillary conjunctivitis, bilateral: Secondary | ICD-10-CM | POA: Diagnosis not present

## 2023-12-21 DIAGNOSIS — H5213 Myopia, bilateral: Secondary | ICD-10-CM | POA: Diagnosis not present

## 2023-12-23 ENCOUNTER — Ambulatory Visit (INDEPENDENT_AMBULATORY_CARE_PROVIDER_SITE_OTHER): Admitting: Psychology

## 2023-12-23 DIAGNOSIS — F4323 Adjustment disorder with mixed anxiety and depressed mood: Secondary | ICD-10-CM

## 2023-12-23 DIAGNOSIS — F411 Generalized anxiety disorder: Secondary | ICD-10-CM

## 2023-12-23 NOTE — Progress Notes (Signed)
 Progress Note:   Name: Kelly Myers Date: 12/23/2023 MRN: [id number] DOB: [birth year] PCP: [name], MD   Time spent: 11:01 AM - 11:59 AM   Today I met in person with Kelly Myers  for in office face-to-face individual psychotherapy.   Reason for Visit /Presenting Problem: Kelly Myers presents after she discovered that her husband has been using heavy drugs (ie., cocaine, Adderall).  Addiction problems have been part of Kelly history (ie., gambling).  She confronted him, asked him to stop, and predictably Kelly Myers wasn't able to.  Kelly Myers also goes through 2 bottles of Vodka a week and often drinks socially.  Kelly Myers has been feeling depressed and anxious.  She has always been a happy, resourceful and resilient person.  Background History:  Kelly Myers (71) they have been married for 49 years.  They have two daughters Kelly Myers (63) and Kelly Myers (40).  Kelly Myers lives in [place] and is single.  She works for [name].  Kelly Myers lives in [place] and is married to a [employer].  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  Kelly Myers and Myers are both originally from [place].  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 They both have aging parents. Her parents are divorced.  [name] and is 68 y.o.  Her Myers is deceased.  Kelly Myers died when Kelly Myers was 47 and the longest lived Kelly disease pt.  Kelly Myers is 68 y.o. and lives at home.  Kelly [name] and might be a Chartered loss adjuster.   Mental Status Exam: Appearance:   Casual     Behavior:  Appropriate  Motor:  Normal  Speech/Language:   NA  Affect:  Appropriate  Mood:  anxious and depressed  Thought process:  normal  Thought content:    WNL  Sensory/Perceptual disturbances:    WNL  Orientation:   oriented to person, place, and time/date  Attention:  NA  Concentration:  Good  Memory:  WNL  Fund of knowledge:   Good  Insight:    Good  Judgment:   Good  Impulse Control:  Good   Reported Symptoms:  worrying about different things, not being able to control her worrying, difficulties relaxing, difficulties concentrating, disrupted appetite, difficulties getting motivated or enjoy normally pleasurable things, feeling bad about herself  Risk Assessment: Danger to Self:  No Self-injurious Behavior: No Danger to Others: No Duty to Warn:no Physical Aggression / Violence:No  Access to Firearms a concern: No  Gang Involvement:No .  Substance Abuse History: Current substance abuse: No     Past Psychiatric History:   Previous psychological history is significant for n/a Outpatient Providers:  History of Psych Hospitalization: No  Psychological Testing: n/a   Abuse History:  Victim of: No., n/a   Report needed: No. Victim of Neglect:No. Perpetrator of n/a  Witness / Exposure to Domestic Violence: No   Protective Services Involvement: No  Witness to [employer] Violence:  No   Family History:  Family History  Problem Relation Age of Onset   Hypertension Myers    Hypertension Myers    Cancer Myers        lung   Hypertension Sister    Heart disease Sister  Heart attack Sister    Heart disease Maternal Grandfather      Medical History/Surgical History: reviewed Past Medical History:  Diagnosis Date   Asthma    Asthma cough- meds control   Atrophic vaginitis    Bleeds easily (HCC)    have had clotting studies, tests, I'm within normal limits (02/12/2016)   Endometriosis    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 EYE ALLERGIES   History of blood transfusion 1984   following childbirth.   History of radiation therapy 1963   after adenoid surgery x 2, they grew back and she was treated w/ XRT to the throat   Hypertension    Hypothyroidism    Osteopenia 05/2018    T score -1.4   Pneumonia    as a child (02/12/2016)   Thyroid  nodule    Uterine prolapse     Past Surgical History:  Procedure Laterality Date   ADENOIDECTOMY  1960; 1963   ANTERIOR AND POSTERIOR VAGINAL REPAIR  2000s   BLADDER SUSPENSION  2000s   CESAREAN SECTION  1984   COLONOSCOPY WITH PROPOFOL  N/A 11/04/2015   Procedure: COLONOSCOPY WITH PROPOFOL ;  Surgeon: Gladis MARLA Louder, MD;  Location: [facility];  Service: Endoscopy;  Laterality: N/A;   TUBAL LIGATION  1984   VAGINAL HYSTERECTOMY  2000s   w/bladder suspension and A/P repair   WISDOM TOOTH EXTRACTION  1974    Medications: Current Outpatient Medications  Medication Sig Dispense Refill   bisoprolol  (ZEBETA ) 5 MG tablet Take 1 tablet (5 mg total) by mouth daily. 90 tablet 0   Calcium  Carbonate-Vitamin D  (CALCIUM  + D PO) Take 1 tablet by mouth daily.     Cetirizine HCl 10 MG CAPS Take 10 mg by mouth daily.     Estradiol  10 MCG TABS vaginal tablet Place 1 tablet (10 mcg total) vaginally 3 (three) times a week. 36 tablet 3   fluticasone  (FLONASE ) 50 MCG/ACT nasal spray Place 2 sprays into both nostrils daily.     glucosamine-chondroitin 500-400 MG tablet Take 1 tablet by mouth daily.      irbesartan  (AVAPRO ) 150 MG tablet Take 1 tablet (150 mg total) by mouth daily. 90 tablet 0   levothyroxine  (SYNTHROID , LEVOTHROID) 100 MCG tablet Take 1 tablet (100 mcg total) by mouth daily before breakfast. 30 tablet 0   montelukast  (SINGULAIR ) 10 MG tablet Take 10 mg by mouth at bedtime.     Nutritional Supplements (ESTROVEN PO) Take by mouth.     rosuvastatin  (CRESTOR ) 10 MG tablet Take 1 tablet (10 mg total) by mouth daily. 90 tablet 1   VENTOLIN HFA 108 (90 Base) MCG/ACT inhaler   0   No current facility-administered medications for this visit.    Allergies  Allergen Reactions   Metoprolol      Irritates asthma   Macrodantin [Nitrofurantoin] Hives   Neosporin [Neomycin-Bacitracin Zn-Polymyx] Rash     Individualized  Treatment Plan        Strengths: strong family connections, helpful, caring, optimistic  Supports: adult children, sister   Goal/Needs for Treatment:  In order of importance to patient 1) Learn and implement skills and strategies to manage anxiety 2) Learn and implement skills and strategies to manage depression 3) Understand the impact her family role has had in her life, and learn how to separate and pursue her own interests/goals 4) Learn how to be more assertive (ie., set limits and boundaries) in a range of settings and experiences   Client Statement of Needs: Learn  life skills that she feels she doesn't have, learning to be more open, honest and rely on other people, learn to be more her own person with her own friends and interests,    Treatment Level: Weekly Outpatient Individual Psychotherapy  Symptoms: anxious, can't stop worrying, not able to let things go, distracted, difficulty sleeping, depressed,   Client Treatment Preferences:  Referred by Dr. Coni   Healthcare consumer's goal for treatment:  Psychologist, Ronal Jenkins Sprung, Ph.D  will support the patient's ability to achieve the goals identified. Cognitive Behavioral Therapy, Dialectical Behavioral Therapy, Motivational Interviewing, Behavioral Motivation, and other evidenced-based practices will be used to promote progress towards healthy functioning.   Healthcare consumer Kelly Myers  will: Actively participate in therapy, working towards healthy functioning.    *Justification for Continuation/Discontinuation of Goal: R=Revised, O=Ongoing, A=Achieved, D=Discontinued  Goal 1) Learn and implement skills and strategies to manage anxiety  Likert rating baseline date: 05/06/23 Target Date Goal Was reviewed Status Code Progress towards goal/Likert rating  05/05/2024           O              Goal 2) Learn and implement skills and strategies to manage depression  Likert rating baseline date: 05/06/23 Target Date  Goal Was reviewed Status Code Progress towards goal/Likert rating  05/05/2024           O              Goal 3) Understand the impact her family role has had in her life, and learn how to separate and pursue her own interests/goals  Likert rating baseline date: 05/06/23 Target Date Goal Was reviewed Status Code Progress towards goal/Likert rating  05/05/2024           O              Goal 4) Learn how to be more assertive (ie., set limits and boundaries) in a range of settings and experiences  5 Point Likert rating baseline date: 05/06/23 Target Date Goal Was reviewed Status Code Progress towards goal/Likert rating  05/05/2024            O                This plan has been reviewed and created by the following participants:  This plan will be reviewed at least every 12 months. Date Behavioral Health Clinician Date Guardian/Patient   05/06/2023 Ronal Jenkins Sprung, Ph.D.  05/06/2023 Kelly LITTIE. Firkus                   Diagnoses:  Adjustment Disorder with mixed anxious and depressed mood   Dalyah reports that unexpectedly her dog had to be put down.  We d/p what occurred, and how she managed the loss.  I provided support around her grief and loss.  Romero states that she had lovely time in [place] with her group of friends.  She had considered not going after the loss of her dog, but in the end was happy for the comfort and distraction of her friends.  Unfortunately, on her return she felt that her husband had been over indulging in alcohol and that this continued throughout the weekend.  We d/p what occurred, how she felt, how she responded, and identified her related anxious thoughts.  We focused our attention on how best to communicate her concerns to her husband related to Kelly drinking, not tolerating Kelly driving under the influence, and on setting firm but  caring limits.  I encouraged Shelli to be direct with her husband about her concerns and of her desire for him to communicate honestly  with Kelly therapist about Kelly drinking.  Ronal Jenkins Sprung, PhD   Husband: Myers Sister: Rock Myers-in-law: Dick

## 2023-12-31 ENCOUNTER — Ambulatory Visit
Admission: RE | Admit: 2023-12-31 | Discharge: 2023-12-31 | Disposition: A | Discharge status: Home / Self Care | Source: Ambulatory Visit | Attending: Family Medicine | Admitting: Family Medicine

## 2023-12-31 ENCOUNTER — Ambulatory Visit (INDEPENDENT_AMBULATORY_CARE_PROVIDER_SITE_OTHER): Admitting: Family Medicine

## 2023-12-31 VITALS — BP 126/84 | Ht 64.0 in | Wt 132.0 lb

## 2023-12-31 DIAGNOSIS — M25551 Pain in right hip: Secondary | ICD-10-CM | POA: Diagnosis not present

## 2023-12-31 DIAGNOSIS — M160 Bilateral primary osteoarthritis of hip: Secondary | ICD-10-CM | POA: Diagnosis not present

## 2023-12-31 MED ORDER — MELOXICAM 15 MG PO TABS: 15.0000 mg | ORAL_TABLET | Freq: Every day | ORAL | 2 refills | Status: AC

## 2023-12-31 NOTE — Progress Notes (Signed)
 PCP: Kelly Senior, MD  Subjective:   HPI: Patient is a 68 y.o. female here for right hip pain.  Patient was evaluated by Dr. 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  She was started in formal physical therapy but this ended up being stopped due to insurance complications.  She is now here today with worsening right hip pain.  He has been worsening for the last few months.  She notices this pain with walking and movement.  She is active and doing yoga and walking up to 2 miles a day.  She denies any recent injuries, traumas or falls.  She states that the pain is in her groin region and sometimes radiates down to her medial knee.  She also has a sensation of lateral and posterior gluteal pain at times as well.  She is currently not taking any type of anti-inflammatories.  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 EYE ALLERGIES   History of blood transfusion 1984   following childbirth.   History of radiation therapy 1963   after adenoid surgery x 2, they grew back and she was treated w/ XRT to the throat   Hypertension    Hypothyroidism    Osteopenia 05/2018   T score -1.4   Pneumonia    as a child (02/12/2016)   Thyroid  nodule    Uterine prolapse     Current Outpatient Medications on File Prior to Visit  Medication Sig Dispense Refill   bisoprolol  (ZEBETA ) 5 MG tablet Take 1 tablet (5 mg total) by mouth daily. 90 tablet 0   Calcium  Carbonate-Vitamin D  (CALCIUM  + D PO) Take 1 tablet by mouth daily.     Cetirizine HCl 10 MG CAPS Take 10 mg by mouth daily.     Estradiol  10 MCG TABS vaginal tablet Place 1 tablet (10 mcg total) vaginally 3 (three) times a week. 36 tablet 3   fluticasone  (FLONASE ) 50 MCG/ACT  nasal spray Place 2 sprays into both nostrils daily.     glucosamine-chondroitin 500-400 MG tablet Take 1 tablet by mouth daily.      irbesartan  (AVAPRO ) 150 MG tablet Take 1 tablet (150 mg total) by mouth daily. 90 tablet 0   levothyroxine  (SYNTHROID , LEVOTHROID) 100 MCG tablet Take 1 tablet (100 mcg total) by mouth daily before breakfast. 30 tablet 0   montelukast  (SINGULAIR ) 10 MG tablet Take 10 mg by mouth at bedtime.     Nutritional Supplements (ESTROVEN PO) Take by mouth.     rosuvastatin  (CRESTOR ) 10 MG tablet Take 1 tablet (10 mg total) by mouth daily. 90 tablet 1   VENTOLIN HFA 108 (90 Base) MCG/ACT inhaler   0   No current facility-administered medications on file prior to visit.    BP 126/84   Ht 5' 4 (1.626 m)   Wt 132 lb (59.9 kg)   BMI 22.66 kg/m        Objective:   Physical Exam:  Gen: NAD, comfortable in exam room Right hip Palpation: No tenderness palpation of the greater trochanter, mild tenderness to palpation over the hip flexor region ROM: Hip flexion is full although there is pain in the groin with this, no  pain with internal or external rotation Special Tests: Positive Stinchfield, negative logroll, negative FABER, positive FADIR, negative [place] Neuro: Strength is 5/5 in bilateral lower extremities with hip flexion, knee extension, knee flexion, plantarflexion and dorsiflexion and sensation is intact  Assessment/Plan:   Kelly Myers is a 68 y.o. female who was seen today for the following: 1. Pain of right hip (Primary) - DG HIPS BILAT W OR W/O PELVIS 3-4 VIEWS; Future -Patient has a history of mild arthritis of the right hip and with worsening pain -Will obtain repeat films  -Patient will be given home exercise hip strengthening program -Sent in meloxicam  15 mg daily with food -No other NSAIDs with her meloxicam  -Follow-up in 6 weeks    Follow-up/Education:   Return in about 6 weeks (around 02/11/2024).   May return sooner as needed and  encouraged to call/e-mail for additional questions or  worsening symptoms in the interim.  Kelly Lowing, DO Sports Medicine Fellow 12/31/2023 10:49 AM

## 2024-01-01 ENCOUNTER — Encounter: Payer: Self-pay | Admitting: Family Medicine

## 2024-01-01 NOTE — Progress Notes (Signed)
 [facility]: Attending Note: I have examined the patient, reviewed the chart, discussed the assessment and plan with the Sports Medicine Fellow. I agree with assessment and treatment plan as detailed in the Fellow's note. Old X Rays from a year ago are reviewed. Some question of cystic change in femoral head around area of round ligament, not mentioned in final report so probably not significant, but given her recent increase in pain she agreed to repeat X ray. Very active. Agree with conservative plan as detailed in Fellow's note.

## 2024-01-06 ENCOUNTER — Ambulatory Visit (INDEPENDENT_AMBULATORY_CARE_PROVIDER_SITE_OTHER): Admitting: Psychology

## 2024-01-06 DIAGNOSIS — F4323 Adjustment disorder with mixed anxiety and depressed mood: Secondary | ICD-10-CM

## 2024-01-06 DIAGNOSIS — R41840 Attention and concentration deficit: Secondary | ICD-10-CM

## 2024-01-06 DIAGNOSIS — F411 Generalized anxiety disorder: Secondary | ICD-10-CM

## 2024-01-06 NOTE — Progress Notes (Signed)
 Progress Note:   Name: Kelly Myers Date: 01/06/2024 MRN: [id number] DOB: 02/14/1956 PCP: [name], MD   Time spent: 11:01 AM - 11:59 AM  Annual Review: 05/05/2024   Today I met in person with Kelly Myers  for in office face-to-face individual psychotherapy.   Reason for Visit /Presenting Problem: Kelly Myers presents after she discovered that her husband has been using heavy drugs (ie., cocaine, Adderall).  Addiction problems have been part of his history (ie., gambling).  She confronted him, asked him to stop, and predictably he wasn't able to.  He also goes through 2 bottles of Vodka a week and often drinks socially.  Kelly Myers has been feeling depressed and anxious.  She has always been a happy, resourceful and resilient person.  Background History:  Kelly Myers and Kelly Myers (69) they have been married for 49 years.  They have two daughters Kelly Myers (24) and Kelly Myers (40).  Kelly Myers lives in [place] and is single.  She works for [name].  Kelly Myers lives in [place] and is married to a [employer].  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  Kelly Myers and Kelly Myers are both originally from [place].  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 They both have aging parents. Her parents are divorced.  Mother lives at [facility] and is 69 y.o.  Her father is deceased.  He died when he was 57 and the longest lived [name]  Kelly Myers's mother is 35 y.o. and lives at home.  His brother is a [name] and lives with their mother and might be a Chartered loss adjuster.   Mental Status Exam: Appearance:   Casual     Behavior:  Appropriate  Motor:  Normal  Speech/Language:   NA  Affect:  Appropriate  Mood:  anxious and depressed  Thought process:  normal  Thought content:    WNL  Sensory/Perceptual  disturbances:    WNL  Orientation:  oriented to person, place, and time/date  Attention:  NA  Concentration:  Good  Memory:  WNL  Fund of knowledge:   Good  Insight:    Good  Judgment:   Good  Impulse Control:  Good   Reported Symptoms:  worrying about different things, not being able to control her worrying, difficulties relaxing, difficulties concentrating, disrupted appetite, difficulties getting motivated or enjoy normally pleasurable things, feeling bad about herself  Risk Assessment: Danger to Self:  No Self-injurious Behavior: No Danger to Others: No Duty to Warn:no Physical Aggression / Violence:No  Access to Firearms a concern: No  Gang Involvement:No .  Substance Abuse History: Current substance abuse: No     Past Psychiatric History:   Previous psychological history is significant for n/a Outpatient Providers:  History of Psych Hospitalization: No  Psychological Testing: n/a   Abuse History:  Victim of: No., n/a   Report needed: No. Victim of Neglect:No. Perpetrator of n/a  Witness / Exposure to Domestic Violence: No   Protective Services Involvement: No  Witness to [employer] Violence:  No   Family History:  Family History  Problem Relation Age of Onset   Hypertension Mother    Hypertension Father    Cancer Father        lung   Hypertension Sister  Heart disease Sister    Heart attack Sister    Heart disease Maternal Grandfather      Medical History/Surgical History: reviewed Past Medical History:  Diagnosis Date   Asthma    Asthma cough- meds control   Atrophic vaginitis    Bleeds easily    have had clotting studies, tests, I'm within normal limits (02/12/2016)   Endometriosis    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 EYE ALLERGIES   History of blood transfusion 1984   following childbirth.   History of radiation therapy 1963   after adenoid surgery x 2, they grew back and she was treated w/ XRT to the throat   Hypertension     Hypothyroidism    Osteopenia 05/2018   T score -1.4   Pneumonia    as a child (02/12/2016)   Thyroid  nodule    Uterine prolapse     Past Surgical History:  Procedure Laterality Date   ADENOIDECTOMY  1960; 1963   ANTERIOR AND POSTERIOR VAGINAL REPAIR  2000s   BLADDER SUSPENSION  2000s   CESAREAN SECTION  1984   COLONOSCOPY WITH PROPOFOL  N/A 11/04/2015   Procedure: COLONOSCOPY WITH PROPOFOL ;  Surgeon: Gladis MARLA Louder, MD;  Location: [facility];  Service: Endoscopy;  Laterality: N/A;   TUBAL LIGATION  1984   VAGINAL HYSTERECTOMY  2000s   w/bladder suspension and A/P repair   WISDOM TOOTH EXTRACTION  1974    Medications: Current Outpatient Medications  Medication Sig Dispense Refill   bisoprolol  (ZEBETA ) 5 MG tablet Take 1 tablet (5 mg total) by mouth daily. 90 tablet 0   Calcium  Carbonate-Vitamin D  (CALCIUM  + D PO) Take 1 tablet by mouth daily.     Cetirizine HCl 10 MG CAPS Take 10 mg by mouth daily.     Estradiol  10 MCG TABS vaginal tablet Place 1 tablet (10 mcg total) vaginally 3 (three) times a week. 36 tablet 3   fluticasone  (FLONASE ) 50 MCG/ACT nasal spray Place 2 sprays into both nostrils daily.     glucosamine-chondroitin 500-400 MG tablet Take 1 tablet by mouth daily.      irbesartan  (AVAPRO ) 150 MG tablet Take 1 tablet (150 mg total) by mouth daily. 90 tablet 0   levothyroxine  (SYNTHROID , LEVOTHROID) 100 MCG tablet Take 1 tablet (100 mcg total) by mouth daily before breakfast. 30 tablet 0   meloxicam  (MOBIC ) 15 MG tablet Take 1 tablet (15 mg total) by mouth daily. 30 tablet 2   montelukast  (SINGULAIR ) 10 MG tablet Take 10 mg by mouth at bedtime.     Nutritional Supplements (ESTROVEN PO) Take by mouth.     rosuvastatin  (CRESTOR ) 10 MG tablet Take 1 tablet (10 mg total) by mouth daily. 90 tablet 1   VENTOLIN HFA 108 (90 Base) MCG/ACT inhaler   0   No current facility-administered medications for this visit.    Allergies  Allergen Reactions   Metoprolol       Irritates asthma   Macrodantin [Nitrofurantoin] Hives   Neosporin [Neomycin-Bacitracin Zn-Polymyx] Rash     Individualized Treatment Plan        Strengths: strong family connections, helpful, caring, optimistic  Supports: adult children, sister   Goal/Needs for Treatment:  In order of importance to patient 1) Learn and implement skills and strategies to manage anxiety 2) Learn and implement skills and strategies to manage depression 3) Understand the impact her family role has had in her life, and learn how to separate and pursue her own interests/goals 4) Learn  how to be more assertive (ie., set limits and boundaries) in a range of settings and experiences   Client Statement of Needs: Learn life skills that she feels she doesn't have, learning to be more open, honest and rely on other people, learn to be more her own person with her own friends and interests,    Treatment Level: Weekly Outpatient Individual Psychotherapy  Symptoms: anxious, can't stop worrying, not able to let things go, distracted, difficulty sleeping, depressed,   Client Treatment Preferences:  Referred by Dr. Coni   Healthcare consumer's goal for treatment:  Psychologist, Ronal Jenkins Sprung, Ph.D  will support the patient's ability to achieve the goals identified. Cognitive Behavioral Therapy, Dialectical Behavioral Therapy, Motivational Interviewing, Behavioral Motivation, and other evidenced-based practices will be used to promote progress towards healthy functioning.   Healthcare consumer Kelly Myers  will: Actively participate in therapy, working towards healthy functioning.    *Justification for Continuation/Discontinuation of Goal: R=Revised, O=Ongoing, A=Achieved, D=Discontinued  Goal 1) Learn and implement skills and strategies to manage anxiety  Likert rating baseline date: 05/06/23 Target Date Goal Was reviewed Status Code Progress towards goal/Likert rating  05/05/2024           O               Goal 2) Learn and implement skills and strategies to manage depression  Likert rating baseline date: 05/06/23 Target Date Goal Was reviewed Status Code Progress towards goal/Likert rating  05/05/2024           O              Goal 3) Understand the impact her family role has had in her life, and learn how to separate and pursue her own interests/goals  Likert rating baseline date: 05/06/23 Target Date Goal Was reviewed Status Code Progress towards goal/Likert rating  05/05/2024           O              Goal 4) Learn how to be more assertive (ie., set limits and boundaries) in a range of settings and experiences  5 Point Likert rating baseline date: 05/06/23 Target Date Goal Was reviewed Status Code Progress towards goal/Likert rating  05/05/2024            O                This plan has been reviewed and created by the following participants:  This plan will be reviewed at least every 12 months. Date Behavioral Health Clinician Date Guardian/Patient   05/06/2023 Ronal Jenkins Sprung, Ph.D.  05/06/2023 Kelly Myers                   Diagnoses:  Adjustment Disorder with mixed anxious and depressed mood   Kyndal reports that it was strange to not have the family over for the Jewish holiday without their mother's.  We d/e/p telling her sister that she wasn't up for doing dinner for the family.  Instead, her niece offered to have the family over for the holiday.  We d/p how she is changing, and this has allowed for the family dynamics to change along side her.  Lastly, we d/p that she met with her orthopedic to review the result of her hip xrays, managing pain/discomfort, and feeling less than satisfied with the outcome of their visit.  I encouraged her to f/p with her ortho and request further imaging in order to get to the root  of her problem and to request PT.  Kelly agreed to call and advocate for herself.   Ronal Jenkins Sprung, PhD   Husband: Kelly Myers Sister: Rock Brother-in-law:  Dick

## 2024-01-19 ENCOUNTER — Other Ambulatory Visit: Payer: Self-pay | Admitting: General Practice

## 2024-01-19 MED ORDER — BISOPROLOL FUMARATE 5 MG PO TABS: 5.0000 mg | ORAL_TABLET | Freq: Every day | ORAL | 3 refills | Status: AC

## 2024-01-19 MED ORDER — IRBESARTAN 150 MG PO TABS: 150.0000 mg | ORAL_TABLET | Freq: Every day | ORAL | 3 refills | Status: AC

## 2024-01-20 ENCOUNTER — Ambulatory Visit (INDEPENDENT_AMBULATORY_CARE_PROVIDER_SITE_OTHER): Admitting: Psychology

## 2024-01-20 DIAGNOSIS — F411 Generalized anxiety disorder: Secondary | ICD-10-CM

## 2024-01-20 DIAGNOSIS — F4323 Adjustment disorder with mixed anxiety and depressed mood: Secondary | ICD-10-CM | POA: Diagnosis not present

## 2024-01-20 NOTE — Progress Notes (Signed)
 Progress Note:   Name: Kelly Myers Date: 01/20/2024 MRN: [id number] DOB: [birth year] PCP: [name], MD   Time spent: 11:01 AM - 11:59 AM  Annual Review: 05/05/2024   Today I met in person with [name]  for in office face-to-face individual psychotherapy.   Reason for Visit /Presenting Problem: [name] presents after she discovered that her husband has been using heavy drugs (ie., cocaine, Adderall).  Addiction problems have been part of his history (ie., gambling).  She confronted him, asked him to stop, and predictably he wasn't able to.  He also goes through 2 bottles of Vodka a week and often drinks socially.  Chablis has been feeling depressed and anxious.  She has always been a happy, resourceful and resilient person.  Background History:  [name] and Kelly Myers (72) they have been married for 49 years.  They have two daughters Kelly Myers (23) and Kelly Myers (40).  Kelly Myers lives in [place] and is single.  She works for [name].  Kelly Myers lives in [place] and is married to a [employer].  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  Kelly Myers and Kelly Myers are both originally from [place].  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 They both have aging parents. Her parents are divorced.  Mother lives at [facility] and is 88 y.o.  Her father is deceased.  He died when he was 76 and the longest lived [name]  Kelly Myers's mother is 41 y.o. and lives at home.  His brother is a [name] and lives with their mother and might be a Chartered loss adjuster.   Mental Status Exam: Appearance:   Casual     Behavior:  Appropriate  Motor:  Normal  Speech/Language:   Kelly Myers  Affect:  Appropriate  Mood:  anxious and depressed  Thought process:  normal  Thought content:    WNL  Sensory/Perceptual  disturbances:    WNL  Orientation:  oriented to person, place, and time/date  Attention:  Kelly Myers  Concentration:  Good  Memory:  WNL  Fund of knowledge:   Good  Insight:    Good  Judgment:   Good  Impulse Control:  Good   Reported Symptoms:  worrying about different things, not being able to control her worrying, difficulties relaxing, difficulties concentrating, disrupted appetite, difficulties getting motivated or enjoy normally pleasurable things, feeling bad about herself  Risk Assessment: Danger to Self:  No Self-injurious Behavior: No Danger to Others: No Duty to Warn:no Physical Aggression / Violence:No  Access to Firearms a concern: No  Gang Involvement:No .  Substance Abuse History: Current substance abuse: No     Past Psychiatric History:   Previous psychological history is significant for n/a Outpatient Providers:  History of Psych Hospitalization: No  Psychological Testing: n/a   Abuse History:  Victim of: No., n/a   Report needed: No. Victim of Neglect:No. Perpetrator of n/a  Witness / Exposure to Domestic Violence: No   Protective Services Involvement: No  Witness to [employer] Violence:  No   Family History:  Family History  Problem Relation Age of Onset   Hypertension Mother    Hypertension Father    Cancer Father        lung   Hypertension Sister  Heart disease Sister    Heart attack Sister    Heart disease Maternal Grandfather      Medical History/Surgical History: reviewed Past Medical History:  Diagnosis Date   Asthma    Asthma cough- meds control   Atrophic vaginitis    Bleeds easily    have had clotting studies, tests, I'm within normal limits (02/12/2016)   Endometriosis    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 EYE ALLERGIES   History of blood transfusion 1984   following childbirth.   History of radiation therapy 1963   after adenoid surgery x 2, they grew back and she was treated w/ XRT to the throat   Hypertension     Hypothyroidism    Osteopenia 05/2018   T score -1.4   Pneumonia    as a child (02/12/2016)   Thyroid  nodule    Uterine prolapse     Past Surgical History:  Procedure Laterality Date   ADENOIDECTOMY  1960; 1963   ANTERIOR AND POSTERIOR VAGINAL REPAIR  2000s   BLADDER SUSPENSION  2000s   CESAREAN SECTION  1984   COLONOSCOPY WITH PROPOFOL  N/A 11/04/2015   Procedure: COLONOSCOPY WITH PROPOFOL ;  Surgeon: Gladis MARLA Louder, MD;  Location: [facility];  Service: Endoscopy;  Laterality: N/A;   TUBAL LIGATION  1984   VAGINAL HYSTERECTOMY  2000s   w/bladder suspension and A/P repair   WISDOM TOOTH EXTRACTION  1974    Medications: Current Outpatient Medications  Medication Sig Dispense Refill   bisoprolol  (ZEBETA ) 5 MG tablet Take 1 tablet (5 mg total) by mouth daily. 90 tablet 3   Calcium  Carbonate-Vitamin D  (CALCIUM  + D PO) Take 1 tablet by mouth daily.     Cetirizine HCl 10 MG CAPS Take 10 mg by mouth daily.     Estradiol  10 MCG TABS vaginal tablet Place 1 tablet (10 mcg total) vaginally 3 (three) times a week. 36 tablet 3   fluticasone  (FLONASE ) 50 MCG/ACT nasal spray Place 2 sprays into both nostrils daily.     glucosamine-chondroitin 500-400 MG tablet Take 1 tablet by mouth daily.      irbesartan  (AVAPRO ) 150 MG tablet Take 1 tablet (150 mg total) by mouth daily. 90 tablet 3   levothyroxine  (SYNTHROID , LEVOTHROID) 100 MCG tablet Take 1 tablet (100 mcg total) by mouth daily before breakfast. 30 tablet 0   meloxicam  (MOBIC ) 15 MG tablet Take 1 tablet (15 mg total) by mouth daily. 30 tablet 2   montelukast  (SINGULAIR ) 10 MG tablet Take 10 mg by mouth at bedtime.     Nutritional Supplements (ESTROVEN PO) Take by mouth.     rosuvastatin  (CRESTOR ) 10 MG tablet Take 1 tablet (10 mg total) by mouth daily. 90 tablet 1   VENTOLIN HFA 108 (90 Base) MCG/ACT inhaler   0   No current facility-administered medications for this visit.    Allergies  Allergen Reactions   Metoprolol       Irritates asthma   Macrodantin [Nitrofurantoin] Hives   Neosporin [Neomycin-Bacitracin Zn-Polymyx] Rash     Individualized Treatment Plan        Strengths: strong family connections, helpful, caring, optimistic  Supports: adult children, sister   Goal/Needs for Treatment:  In order of importance to patient 1) Learn and implement skills and strategies to manage anxiety 2) Learn and implement skills and strategies to manage depression 3) Understand the impact her family role has had in her life, and learn how to separate and pursue her own interests/goals 4) Learn  how to be more assertive (ie., set limits and boundaries) in a range of settings and experiences   Client Statement of Needs: Learn life skills that she feels she doesn't have, learning to be more open, honest and rely on other people, learn to be more her own person with her own friends and interests,    Treatment Level: Weekly Outpatient Individual Psychotherapy  Symptoms: anxious, can't stop worrying, not able to let things go, distracted, difficulty sleeping, depressed,   Client Treatment Preferences:  Referred by Dr. Coni   Healthcare consumer's goal for treatment:  Psychologist, Ronal Jenkins Sprung, Ph.D  will support the patient's ability to achieve the goals identified. Cognitive Behavioral Therapy, Dialectical Behavioral Therapy, Motivational Interviewing, Behavioral Motivation, and other evidenced-based practices will be used to promote progress towards healthy functioning.   Healthcare consumer [name]  will: Actively participate in therapy, working towards healthy functioning.    *Justification for Continuation/Discontinuation of Goal: R=Revised, O=Ongoing, A=Achieved, D=Discontinued  Goal 1) Learn and implement skills and strategies to manage anxiety  Likert rating baseline date: 05/06/23 Target Date Goal Was reviewed Status Code Progress towards goal/Likert rating  05/05/2024           O               Goal 2) Learn and implement skills and strategies to manage depression  Likert rating baseline date: 05/06/23 Target Date Goal Was reviewed Status Code Progress towards goal/Likert rating  05/05/2024           O              Goal 3) Understand the impact her family role has had in her life, and learn how to separate and pursue her own interests/goals  Likert rating baseline date: 05/06/23 Target Date Goal Was reviewed Status Code Progress towards goal/Likert rating  05/05/2024           O              Goal 4) Learn how to be more assertive (ie., set limits and boundaries) in a range of settings and experiences  5 Point Likert rating baseline date: 05/06/23 Target Date Goal Was reviewed Status Code Progress towards goal/Likert rating  05/05/2024            O                This plan has been reviewed and created by the following participants:  This plan will be reviewed at least every 12 months. Date Behavioral Health Clinician Date Guardian/Patient   05/06/2023 Ronal Jenkins Sprung, Ph.D.  05/06/2023 Santa LITTIE. Merlino                   Diagnoses:  Adjustment Disorder with mixed anxious and depressed mood   Kizzi reports that she hasn't been sleeping well because her husband snores.  We d/e/p sleep history, her concerns about her husband, and ways to encourage him to seek a sleep study.   We also d/p getting through the Jewish holidays, the anniversary of her father's death, and her mother's birthday this first season without them.  I provided the support and guidance needed as Lamiyah continues to p/ her grief.  Lastly, we d/e/p a disappointment with her husband who seems unable to communicate on anything other than a superficial level, and ways to join him where he is at right now.  I noted her feelings of aloneness especially as she grief these significant losses.  Ronal Jenkins Sprung, PhD   Husband: Kelly Myers Sister: Rock Brother-in-law: Dick

## 2024-02-03 ENCOUNTER — Ambulatory Visit (INDEPENDENT_AMBULATORY_CARE_PROVIDER_SITE_OTHER): Admitting: Psychology

## 2024-02-03 ENCOUNTER — Encounter: Payer: Self-pay | Admitting: Pharmacist Clinician (PhC)/ Clinical Pharmacy Specialist

## 2024-02-03 DIAGNOSIS — F4323 Adjustment disorder with mixed anxiety and depressed mood: Secondary | ICD-10-CM

## 2024-02-03 DIAGNOSIS — F411 Generalized anxiety disorder: Secondary | ICD-10-CM

## 2024-02-03 NOTE — Progress Notes (Signed)
 Progress Note:   Name: Kelly Myers Date: 02/03/2024 MRN: [id number] DOB: 1955-10-26 PCP: [name], MD   Time spent: 10:01 AM - 10:59 AM  Annual Review: 05/05/2024   Today I met in person with Kelly Myers  for in office face-to-face individual psychotherapy.   Reason for Visit /Presenting Problem: Kelly Myers presents after she discovered that her husband has been using heavy drugs (ie., cocaine, Adderall).  Addiction problems have been part of his history (ie., gambling).  She confronted him, asked him to stop, and predictably he wasn't able to.  He also goes through 2 bottles of Vodka a week and often drinks socially.  Kelly Myers has been feeling depressed and anxious.  She has always been a happy, resourceful and resilient person.  Background History:  Kelly Myers and Kelly Myers (71) they have been married for 49 years.  They have two daughters Kelly Myers (9) and Kelly Myers (40).  Kelly Myers lives in [place] and is single.  She works for [name].  [name] lives in [place] and is married to a [employer].  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  Kelly Myers and Kelly Myers are both originally from [place].  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 They both have aging parents. Her parents are divorced.  Mother lives at [facility] and is 82 y.o.  Her father is deceased.  He died when he was 22 and the longest lived [name]  [name] is 79 y.o. and lives at home.  His brother is a [name] and lives with their mother and might be a Chartered loss adjuster.   Mental Status Exam: Appearance:   Casual     Behavior:  Appropriate  Motor:  Normal  Speech/Language:   NA  Affect:  Appropriate  Mood:  anxious and depressed  Thought process:  normal  Thought content:    WNL  Sensory/Perceptual  disturbances:    WNL  Orientation:  oriented to person, place, and time/date  Attention:  NA  Concentration:  Good  Memory:  WNL  Fund of knowledge:   Good  Insight:    Good  Judgment:   Good  Impulse Control:  Good   Reported Symptoms:  worrying about different things, not being able to control her worrying, difficulties relaxing, difficulties concentrating, disrupted appetite, difficulties getting motivated or enjoy normally pleasurable things, feeling bad about herself  Risk Assessment: Danger to Self:  No Self-injurious Behavior: No Danger to Others: No Duty to Warn:no Physical Aggression / Violence:No  Access to Firearms a concern: No  Gang Involvement:No .  Substance Abuse History: Current substance abuse: No     Past Psychiatric History:   Previous psychological history is significant for n/a Outpatient Providers:  History of Psych Hospitalization: No  Psychological Testing: n/a   Abuse History:  Victim of: No., n/a   Report needed: No. Victim of Neglect:No. Perpetrator of n/a  Witness / Exposure to Domestic Violence: No   Protective Services Involvement: No  Witness to [employer] Violence:  No   Family History:  Family History  Problem Relation Age of Onset   Hypertension Mother    Hypertension Father    Cancer Father        lung   Hypertension Sister  Heart disease Sister    Heart attack Sister    Heart disease Maternal Grandfather      Medical History/Surgical History: reviewed Past Medical History:  Diagnosis Date   Asthma    Asthma cough- meds control   Atrophic vaginitis    Bleeds easily    have had clotting studies, tests, I'm within normal limits (02/12/2016)   Endometriosis    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 EYE ALLERGIES   History of blood transfusion 1984   following childbirth.   History of radiation therapy 1963   after adenoid surgery x 2, they grew back and she was treated w/ XRT to the throat   Hypertension     Hypothyroidism    Osteopenia 05/2018   T score -1.4   Pneumonia    as a child (02/12/2016)   Thyroid  nodule    Uterine prolapse     Past Surgical History:  Procedure Laterality Date   ADENOIDECTOMY  1960; 1963   ANTERIOR AND POSTERIOR VAGINAL REPAIR  2000s   BLADDER SUSPENSION  2000s   CESAREAN SECTION  1984   COLONOSCOPY WITH PROPOFOL  N/A 11/04/2015   Procedure: COLONOSCOPY WITH PROPOFOL ;  Surgeon: Gladis MARLA Louder, MD;  Location: [facility];  Service: Endoscopy;  Laterality: N/A;   TUBAL LIGATION  1984   VAGINAL HYSTERECTOMY  2000s   w/bladder suspension and A/P repair   WISDOM TOOTH EXTRACTION  1974    Medications: Current Outpatient Medications  Medication Sig Dispense Refill   bisoprolol  (ZEBETA ) 5 MG tablet Take 1 tablet (5 mg total) by mouth daily. 90 tablet 3   Calcium  Carbonate-Vitamin D  (CALCIUM  + D PO) Take 1 tablet by mouth daily.     Cetirizine HCl 10 MG CAPS Take 10 mg by mouth daily.     Estradiol  10 MCG TABS vaginal tablet Place 1 tablet (10 mcg total) vaginally 3 (three) times a week. 36 tablet 3   fluticasone  (FLONASE ) 50 MCG/ACT nasal spray Place 2 sprays into both nostrils daily.     glucosamine-chondroitin 500-400 MG tablet Take 1 tablet by mouth daily.      irbesartan  (AVAPRO ) 150 MG tablet Take 1 tablet (150 mg total) by mouth daily. 90 tablet 3   levothyroxine  (SYNTHROID , LEVOTHROID) 100 MCG tablet Take 1 tablet (100 mcg total) by mouth daily before breakfast. 30 tablet 0   meloxicam  (MOBIC ) 15 MG tablet Take 1 tablet (15 mg total) by mouth daily. 30 tablet 2   montelukast  (SINGULAIR ) 10 MG tablet Take 10 mg by mouth at bedtime.     Nutritional Supplements (ESTROVEN PO) Take by mouth.     rosuvastatin  (CRESTOR ) 10 MG tablet Take 1 tablet (10 mg total) by mouth daily. 90 tablet 1   VENTOLIN HFA 108 (90 Base) MCG/ACT inhaler   0   No current facility-administered medications for this visit.    Allergies  Allergen Reactions   Metoprolol       Irritates asthma   Macrodantin [Nitrofurantoin] Hives   Neosporin [Neomycin-Bacitracin Zn-Polymyx] Rash     Individualized Treatment Plan        Strengths: strong family connections, helpful, caring, optimistic  Supports: adult children, sister   Goal/Needs for Treatment:  In order of importance to patient 1) Learn and implement skills and strategies to manage anxiety 2) Learn and implement skills and strategies to manage depression 3) Understand the impact her family role has had in her life, and learn how to separate and pursue her own interests/goals 4) Learn  how to be more assertive (ie., set limits and boundaries) in a range of settings and experiences   Client Statement of Needs: Learn life skills that she feels she doesn't have, learning to be more open, honest and rely on other people, learn to be more her own person with her own friends and interests,    Treatment Level: Weekly Outpatient Individual Psychotherapy  Symptoms: anxious, can't stop worrying, not able to let things go, distracted, difficulty sleeping, depressed,   Client Treatment Preferences:  Referred by Dr. Coni   Healthcare consumer's goal for treatment:  Psychologist, Ronal Jenkins Sprung, Ph.D  will support the patient's ability to achieve the goals identified. Cognitive Behavioral Therapy, Dialectical Behavioral Therapy, Motivational Interviewing, Behavioral Motivation, and other evidenced-based practices will be used to promote progress towards healthy functioning.   Healthcare consumer Kelly Myers  will: Actively participate in therapy, working towards healthy functioning.    *Justification for Continuation/Discontinuation of Goal: R=Revised, O=Ongoing, A=Achieved, D=Discontinued  Goal 1) Learn and implement skills and strategies to manage anxiety  Likert rating baseline date: 05/06/23 Target Date Goal Was reviewed Status Code Progress towards goal/Likert rating  05/05/2024           O               Goal 2) Learn and implement skills and strategies to manage depression  Likert rating baseline date: 05/06/23 Target Date Goal Was reviewed Status Code Progress towards goal/Likert rating  05/05/2024           O              Goal 3) Understand the impact her family role has had in her life, and learn how to separate and pursue her own interests/goals  Likert rating baseline date: 05/06/23 Target Date Goal Was reviewed Status Code Progress towards goal/Likert rating  05/05/2024           O              Goal 4) Learn how to be more assertive (ie., set limits and boundaries) in a range of settings and experiences  5 Point Likert rating baseline date: 05/06/23 Target Date Goal Was reviewed Status Code Progress towards goal/Likert rating  05/05/2024            O                This plan has been reviewed and created by the following participants:  This plan will be reviewed at least every 12 months. Date Behavioral Health Clinician Date Guardian/Patient   05/06/2023 Ronal Jenkins Sprung, Ph.D.  05/06/2023 Kelly Myers                   Diagnoses:  Adjustment Disorder with mixed anxious and depressed mood   Kelly Myers reports that she went home after our last session and approached her husband about joining him once a week for a game.  We d/p how pleased he was, how surprised she was by this response, and being reminded that communication needs improvement.  Kelly and I d/e/p her pattern of pushing away bad thoughts and how they interfere with daily life.  We walked through a few of these scenarios, identified the anxious thoughts, and practiced challenging them.  I noted that a common theme across these scenarios related to a pattern of communication that was in passive voice and vague, when it could have just as easily been more direct and included important details.  Romero  was able to see this, could easily identify her  motives and d/ the ways in which she would be less frustrated if she  changed her approach.   Ronal Jenkins Sprung, PhD   Husband: Kelly Myers Sister: Rock Brother-in-law: Dick

## 2024-02-11 ENCOUNTER — Ambulatory Visit: Admitting: Family Medicine

## 2024-02-11 VITALS — BP 148/84 | Ht 64.0 in | Wt 132.0 lb

## 2024-02-11 DIAGNOSIS — M25559 Pain in unspecified hip: Secondary | ICD-10-CM | POA: Diagnosis not present

## 2024-02-11 NOTE — Progress Notes (Signed)
   Discussed the use of AI scribe software for clinical note transcription with the patient, who gave verbal consent to proceed.  History of Present Illness   Kelly Myers is a 68 year old female with mild arthritis in both hips who presents for follow-up on hip pain management.  Hip pain and functional limitations - Significant improvement in daily pain management since starting anti-inflammatory medication six weeks ago - No longer experiences daily pain - Discomfort occurs with specific exercises, particularly movements requiring leg abduction and elevation without support - Popping sensation in the right hip during these exercises - Left hip does not cause pain, but full extension outwards results in discomfort - Previous imaging revealed mild arthritis in both hips  Medication response and activity level - Uses anti-inflammatory medication as needed - Describes medication effect as 'like a miracle' - Maintains an active lifestyle, walking two miles daily - Interested in continuing exercises to strengthen hips        PERTINENT  PMH / PSH: I have reviewed the patient's medications, allergies, past medical and surgical history, smoking status.  Pertinent findings that relate to today's visit / issues include:   Physical Exam   MUSCULOSKELETAL: Hip range of motion, flexion, rotation, and abduction are normal.         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 Right hip pain occurs during specific exercises, with tenderness over hip flexors indicating tightness. - Continue Mobic  (meloxicam ) as needed for pain, not daily. - Incorporate hip flexor exercises into regimen. - Plan formal physical therapy in January with new insurance. - Encourage daily walking, assess pain management without medication on less active days. - Advise follow-up  if symptoms worsen or change.

## 2024-02-15 ENCOUNTER — Ambulatory Visit (INDEPENDENT_AMBULATORY_CARE_PROVIDER_SITE_OTHER): Admitting: Psychology

## 2024-02-15 DIAGNOSIS — F411 Generalized anxiety disorder: Secondary | ICD-10-CM

## 2024-02-15 NOTE — Progress Notes (Signed)
 Progress Note:   Name: Kelly Myers Date: 02/15/2024 MRN: [id number] DOB: Nov 13, 1955 PCP: [name], MD   Time spent: 10:01 AM - 10:59 AM  Annual Review: 05/05/2024   Today I met in person with Kelly Myers  for in office face-to-face individual psychotherapy.   Reason for Visit /Presenting Problem: Kelly Myers presents after she discovered that her husband has been using heavy drugs (ie., cocaine, Adderall).  Addiction problems have been part of his history (ie., gambling).  She confronted him, asked him to stop, and predictably he wasn't able to.  He also goes through 2 bottles of Vodka a week and often drinks socially.  Kelly Myers has been feeling depressed and anxious.  She has always been a happy, resourceful and resilient person.  Background History:  Kelly Myers and Kelly Myers (72) they have been married for 49 years.  They have two daughters Kelly Myers (13) and Kelly Myers (40).  Kelly Myers lives in [place] and is single.  She works for [name].  Kelly Myers lives in [place] and is married to a Kelly Myers.  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  Kelly Myers are both originally from [place].  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 They both have aging parents. Her parents are divorced.  [name] and is 38 y.o.  Her father is deceased.  He died when he was 80 and the longest lived Kelly Myers.  Kelly Myers is 68 y.o. and lives at home.  His brother is a cytogeneticist and lives with their Myers and might be a chartered loss adjuster.   Mental Status Exam: Appearance:   Casual     Behavior:  Appropriate  Motor:  Normal  Speech/Language:   NA  Affect:  Appropriate  Mood:  anxious and depressed  Thought process:  normal  Thought content:    WNL  Sensory/Perceptual  disturbances:    WNL  Orientation:  oriented to person, place, and time/date  Attention:  NA  Concentration:  Good  Memory:  WNL  Fund of knowledge:   Good  Insight:    Good  Judgment:   Good  Impulse Control:  Good   Reported Symptoms:  worrying about different things, not being able to control her worrying, difficulties relaxing, difficulties concentrating, disrupted appetite, difficulties getting motivated or enjoy normally pleasurable things, feeling bad about herself  Risk Assessment: Danger to Self:  No Self-injurious Behavior: No Danger to Others: No Duty to Warn:no Physical Aggression / Violence:No  Access to Firearms a concern: No  Gang Involvement:No .  Substance Abuse History: Current substance abuse: No     Past Psychiatric History:   Previous psychological history is significant for n/a Outpatient Providers:  History of Psych Hospitalization: No  Psychological Testing: n/a   Abuse History:  Victim of: No., n/a   Report needed: No. Victim of Neglect:No. Perpetrator of n/a  Witness / Exposure to Domestic Violence: No   Protective Services Involvement: No  Witness to [employer] Violence:  No   Family History:  Family History  Problem Relation Age of Onset   Hypertension Myers    Hypertension Father    Cancer Father        lung   Hypertension Sister  Heart disease Sister    Heart attack Sister    Heart disease Maternal Grandfather      Medical History/Surgical History: reviewed Past Medical History:  Diagnosis Date   Asthma    Asthma cough- meds control   Atrophic vaginitis    Bleeds easily    have had clotting studies, tests, I'm within normal limits (02/12/2016)   Endometriosis    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 EYE ALLERGIES   History of blood transfusion 1984   following childbirth.   History of radiation therapy 1963   after adenoid surgery x 2, they grew back and she was treated w/ XRT to the throat   Hypertension     Hypothyroidism    Osteopenia 05/2018   T score -1.4   Pneumonia    as a child (02/12/2016)   Thyroid  nodule    Uterine prolapse     Past Surgical History:  Procedure Laterality Date   ADENOIDECTOMY  1960; 1963   ANTERIOR AND POSTERIOR VAGINAL REPAIR  2000s   BLADDER SUSPENSION  2000s   CESAREAN SECTION  1984   COLONOSCOPY WITH PROPOFOL  N/A 11/04/2015   Procedure: COLONOSCOPY WITH PROPOFOL ;  Surgeon: Gladis MARLA Louder, MD;  Location: [facility];  Service: Endoscopy;  Laterality: N/A;   TUBAL LIGATION  1984   VAGINAL HYSTERECTOMY  2000s   w/bladder suspension and A/P repair   WISDOM TOOTH EXTRACTION  1974    Medications: Current Outpatient Medications  Medication Sig Dispense Refill   bisoprolol  (ZEBETA ) 5 MG tablet Take 1 tablet (5 mg total) by mouth daily. 90 tablet 3   Calcium  Carbonate-Vitamin D  (CALCIUM  + D PO) Take 1 tablet by mouth daily.     Cetirizine HCl 10 MG CAPS Take 10 mg by mouth daily.     Estradiol  10 MCG TABS vaginal tablet Place 1 tablet (10 mcg total) vaginally 3 (three) times a week. 36 tablet 3   fluticasone  (FLONASE ) 50 MCG/ACT nasal spray Place 2 sprays into both nostrils daily.     glucosamine-chondroitin 500-400 MG tablet Take 1 tablet by mouth daily.      irbesartan  (AVAPRO ) 150 MG tablet Take 1 tablet (150 mg total) by mouth daily. 90 tablet 3   levothyroxine  (SYNTHROID , LEVOTHROID) 100 MCG tablet Take 1 tablet (100 mcg total) by mouth daily before breakfast. 30 tablet 0   meloxicam  (MOBIC ) 15 MG tablet Take 1 tablet (15 mg total) by mouth daily. 30 tablet 2   montelukast  (SINGULAIR ) 10 MG tablet Take 10 mg by mouth at bedtime.     Nutritional Supplements (ESTROVEN PO) Take by mouth.     rosuvastatin  (CRESTOR ) 10 MG tablet Take 1 tablet (10 mg total) by mouth daily. 90 tablet 1   VENTOLIN HFA 108 (90 Base) MCG/ACT inhaler   0   No current facility-administered medications for this visit.    Allergies  Allergen Reactions   Metoprolol       Irritates asthma   Macrodantin [Nitrofurantoin] Hives   Neosporin [Neomycin-Bacitracin Zn-Polymyx] Rash     Individualized Treatment Plan        Strengths: strong family connections, helpful, caring, optimistic  Supports: adult children, sister   Goal/Needs for Treatment:  In order of importance to patient 1) Learn and implement skills and strategies to manage anxiety 2) Learn and implement skills and strategies to manage depression 3) Understand the impact her family role has had in her life, and learn how to separate and pursue her own interests/goals 4) Learn  how to be more assertive (ie., set limits and boundaries) in a range of settings and experiences   Client Statement of Needs: Learn life skills that she feels she doesn't have, learning to be more open, honest and rely on other people, learn to be more her own person with her own friends and interests,    Treatment Level: Weekly Outpatient Individual Psychotherapy  Symptoms: anxious, can't stop worrying, not able to let things go, distracted, difficulty sleeping, depressed,   Client Treatment Preferences:  Referred by Dr. Coni   Healthcare consumer's goal for treatment:  Psychologist, Ronal Jenkins Sprung, Ph.D  will support the patient's ability to achieve the goals identified. Cognitive Behavioral Therapy, Dialectical Behavioral Therapy, Motivational Interviewing, Behavioral Motivation, and other evidenced-based practices will be used to promote progress towards healthy functioning.   Healthcare consumer Kelly Myers  will: Actively participate in therapy, working towards healthy functioning.    *Justification for Continuation/Discontinuation of Goal: R=Revised, O=Ongoing, A=Achieved, D=Discontinued  Goal 1) Learn and implement skills and strategies to manage anxiety  Likert rating baseline date: 05/06/23 Target Date Goal Was reviewed Status Code Progress towards goal/Likert rating  05/05/2024           O               Goal 2) Learn and implement skills and strategies to manage depression  Likert rating baseline date: 05/06/23 Target Date Goal Was reviewed Status Code Progress towards goal/Likert rating  05/05/2024           O              Goal 3) Understand the impact her family role has had in her life, and learn how to separate and pursue her own interests/goals  Likert rating baseline date: 05/06/23 Target Date Goal Was reviewed Status Code Progress towards goal/Likert rating  05/05/2024           O              Goal 4) Learn how to be more assertive (ie., set limits and boundaries) in a range of settings and experiences  5 Point Likert rating baseline date: 05/06/23 Target Date Goal Was reviewed Status Code Progress towards goal/Likert rating  05/05/2024            O                This plan has been reviewed and created by the following participants:  This plan will be reviewed at least every 12 months. Date Behavioral Health Clinician Date Guardian/Patient   05/06/2023 Ronal Jenkins Sprung, Ph.D.  05/06/2023 Kelly LITTIE. Keziah                   Diagnoses:  Adjustment Disorder with mixed anxious and depressed mood   Kelly Myers reports that the past two weeks had some ups and downs.  She states that her husband asked to have a d/ about his therapy.  We d/e/p what occurred, deciding to attend [facility] meetings starting in January once they have passed the year mark of her Myers's death.  Lastly, we d/p that it's been a difficult year of losses, how it is good to have positive things to look forward to (50th anniversary celebration, travel), and feeling gratitude for all the good there is in her life.  Ronal Jenkins Sprung, PhD   Husband: Kelly Myers Sister: Rock Brother-in-law: Dick

## 2024-02-17 DIAGNOSIS — Z8349 Family history of other endocrine, nutritional and metabolic diseases: Secondary | ICD-10-CM | POA: Diagnosis not present

## 2024-02-17 DIAGNOSIS — L111 Transient acantholytic dermatosis [Grover]: Secondary | ICD-10-CM | POA: Diagnosis not present

## 2024-02-17 DIAGNOSIS — M8580 Other specified disorders of bone density and structure, unspecified site: Secondary | ICD-10-CM | POA: Diagnosis not present

## 2024-02-17 DIAGNOSIS — I119 Hypertensive heart disease without heart failure: Secondary | ICD-10-CM | POA: Diagnosis not present

## 2024-02-17 DIAGNOSIS — D126 Benign neoplasm of colon, unspecified: Secondary | ICD-10-CM | POA: Diagnosis not present

## 2024-02-17 DIAGNOSIS — R931 Abnormal findings on diagnostic imaging of heart and coronary circulation: Secondary | ICD-10-CM | POA: Diagnosis not present

## 2024-02-17 DIAGNOSIS — Z7689 Persons encountering health services in other specified circumstances: Secondary | ICD-10-CM | POA: Diagnosis not present

## 2024-02-17 DIAGNOSIS — J309 Allergic rhinitis, unspecified: Secondary | ICD-10-CM | POA: Diagnosis not present

## 2024-02-17 DIAGNOSIS — Z0000 Encounter for general adult medical examination without abnormal findings: Secondary | ICD-10-CM | POA: Diagnosis not present

## 2024-02-17 DIAGNOSIS — Z1339 Encounter for screening examination for other mental health and behavioral disorders: Secondary | ICD-10-CM | POA: Diagnosis not present

## 2024-02-17 DIAGNOSIS — E782 Mixed hyperlipidemia: Secondary | ICD-10-CM | POA: Diagnosis not present

## 2024-02-17 DIAGNOSIS — I10 Essential (primary) hypertension: Secondary | ICD-10-CM | POA: Diagnosis not present

## 2024-02-17 DIAGNOSIS — E039 Hypothyroidism, unspecified: Secondary | ICD-10-CM | POA: Diagnosis not present

## 2024-02-17 DIAGNOSIS — E042 Nontoxic multinodular goiter: Secondary | ICD-10-CM | POA: Diagnosis not present

## 2024-02-17 DIAGNOSIS — R7989 Other specified abnormal findings of blood chemistry: Secondary | ICD-10-CM | POA: Diagnosis not present

## 2024-02-17 DIAGNOSIS — Z1331 Encounter for screening for depression: Secondary | ICD-10-CM | POA: Diagnosis not present

## 2024-03-15 ENCOUNTER — Ambulatory Visit: Admitting: Psychology

## 2024-03-16 ENCOUNTER — Ambulatory Visit: Admitting: Psychology

## 2024-03-16 DIAGNOSIS — F411 Generalized anxiety disorder: Secondary | ICD-10-CM

## 2024-03-16 NOTE — Progress Notes (Signed)
 Progress Note:   Name: SHRINIKA BLATZ Date: 03/16/2024 MRN: [id number] DOB: Jan 18, 1956 PCP: [name], MD   Time spent: 10:01 AM - 10:59 AM  Annual Review: 05/05/2024   Today I met in person with [name]  for in office face-to-face individual psychotherapy.   Reason for Visit /Presenting Problem: ONI DIETZMAN presents after she discovered that her husband has been using heavy drugs (ie., cocaine, Adderall).  Addiction problems have been part of his history (ie., gambling).  She confronted him, asked him to stop, and predictably he wasn't able to.  He also goes through 2 bottles of Vodka a week and often drinks socially.  Di has been feeling depressed and anxious.  She has always been a happy, resourceful and resilient person.  Background History:  Bryelle and [name] (72) they have been married for 49 years.  They have two daughters [name] (64) and [name] (40).  [name] lives in [place] and is single.  She works for [name].  [name] lives in [place] and is married to a [employer].  They lived with them for two years after they decided to leave academia and go to social worker school in [place].  Cherice and [name] are both originally from [place].  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 They both have aging parents. Her parents are divorced.  Mother lives at [facility] and is 66 y.o.  Her father is deceased.  He died when he was 22 and the longest lived [name]  [name] is 58 y.o. and lives at home.  His brother is a cytogeneticist and lives with their mother and might be a chartered loss adjuster.   Mental Status Exam: Appearance:   Casual     Behavior:  Appropriate  Motor:  Normal  Speech/Language:   NA  Affect:  Appropriate  Mood:  anxious and depressed  Thought process:  normal  Thought content:    WNL  Sensory/Perceptual  disturbances:    WNL  Orientation:  oriented to person, place, and time/date  Attention:  NA  Concentration:  Good  Memory:  WNL  Fund of knowledge:   Good  Insight:    Good  Judgment:   Good  Impulse Control:  Good   Reported Symptoms:  worrying about different things, not being able to control her worrying, difficulties relaxing, difficulties concentrating, disrupted appetite, difficulties getting motivated or enjoy normally pleasurable things, feeling bad about herself  Risk Assessment: Danger to Self:  No Self-injurious Behavior: No Danger to Others: No Duty to Warn:no Physical Aggression / Violence:No  Access to Firearms a concern: No  Gang Involvement:No .  Substance Abuse History: Current substance abuse: No     Past Psychiatric History:   Previous psychological history is significant for n/a Outpatient Providers:  History of Psych Hospitalization: No  Psychological Testing: n/a   Abuse History:  Victim of: No., n/a   Report needed: No. Victim of Neglect:No. Perpetrator of n/a  Witness / Exposure to Domestic Violence: No   Protective Services Involvement: No  Witness to [employer] Violence:  No   Family History:  Family History  Problem Relation Age of Onset   Hypertension Mother    Hypertension Father    Cancer Father        lung   Hypertension Sister  Heart disease Sister    Heart attack Sister    Heart disease Maternal Grandfather      Medical History/Surgical History: reviewed Past Medical History:  Diagnosis Date   Asthma    Asthma cough- meds control   Atrophic vaginitis    Bleeds easily    have had clotting studies, tests, I'm within normal limits (02/12/2016)   Endometriosis    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 EYE ALLERGIES   History of blood transfusion 1984   following childbirth.   History of radiation therapy 1963   after adenoid surgery x 2, they grew back and she was treated w/ XRT to the throat   Hypertension     Hypothyroidism    Osteopenia 05/2018   T score -1.4   Pneumonia    as a child (02/12/2016)   Thyroid  nodule    Uterine prolapse     Past Surgical History:  Procedure Laterality Date   ADENOIDECTOMY  1960; 1963   ANTERIOR AND POSTERIOR VAGINAL REPAIR  2000s   BLADDER SUSPENSION  2000s   CESAREAN SECTION  1984   COLONOSCOPY WITH PROPOFOL  N/A 11/04/2015   Procedure: COLONOSCOPY WITH PROPOFOL ;  Surgeon: Gladis MARLA Louder, MD;  Location: [facility];  Service: Endoscopy;  Laterality: N/A;   TUBAL LIGATION  1984   VAGINAL HYSTERECTOMY  2000s   w/bladder suspension and A/P repair   WISDOM TOOTH EXTRACTION  1974    Medications: Current Outpatient Medications  Medication Sig Dispense Refill   bisoprolol  (ZEBETA ) 5 MG tablet Take 1 tablet (5 mg total) by mouth daily. 90 tablet 3   Calcium  Carbonate-Vitamin D  (CALCIUM  + D PO) Take 1 tablet by mouth daily.     Cetirizine HCl 10 MG CAPS Take 10 mg by mouth daily.     Estradiol  10 MCG TABS vaginal tablet Place 1 tablet (10 mcg total) vaginally 3 (three) times a week. 36 tablet 3   fluticasone  (FLONASE ) 50 MCG/ACT nasal spray Place 2 sprays into both nostrils daily.     glucosamine-chondroitin 500-400 MG tablet Take 1 tablet by mouth daily.      irbesartan  (AVAPRO ) 150 MG tablet Take 1 tablet (150 mg total) by mouth daily. 90 tablet 3   levothyroxine  (SYNTHROID , LEVOTHROID) 100 MCG tablet Take 1 tablet (100 mcg total) by mouth daily before breakfast. 30 tablet 0   meloxicam  (MOBIC ) 15 MG tablet Take 1 tablet (15 mg total) by mouth daily. 30 tablet 2   montelukast  (SINGULAIR ) 10 MG tablet Take 10 mg by mouth at bedtime.     Nutritional Supplements (ESTROVEN PO) Take by mouth.     rosuvastatin  (CRESTOR ) 10 MG tablet Take 1 tablet (10 mg total) by mouth daily. 90 tablet 1   VENTOLIN HFA 108 (90 Base) MCG/ACT inhaler   0   No current facility-administered medications for this visit.    Allergies  Allergen Reactions   Metoprolol       Irritates asthma   Macrodantin [Nitrofurantoin] Hives   Neosporin [Neomycin-Bacitracin Zn-Polymyx] Rash     Individualized Treatment Plan        Strengths: strong family connections, helpful, caring, optimistic  Supports: adult children, sister   Goal/Needs for Treatment:  In order of importance to patient 1) Learn and implement skills and strategies to manage anxiety 2) Learn and implement skills and strategies to manage depression 3) Understand the impact her family role has had in her life, and learn how to separate and pursue her own interests/goals 4) Learn  how to be more assertive (ie., set limits and boundaries) in a range of settings and experiences   Client Statement of Needs: Learn life skills that she feels she doesn't have, learning to be more open, honest and rely on other people, learn to be more her own person with her own friends and interests,    Treatment Level: Weekly Outpatient Individual Psychotherapy  Symptoms: anxious, can't stop worrying, not able to let things go, distracted, difficulty sleeping, depressed,   Client Treatment Preferences:  Referred by Dr. Coni   Healthcare consumer's goal for treatment:  Psychologist, Ronal Jenkins Sprung, Ph.D  will support the patient's ability to achieve the goals identified. Cognitive Behavioral Therapy, Dialectical Behavioral Therapy, Motivational Interviewing, Behavioral Motivation, and other evidenced-based practices will be used to promote progress towards healthy functioning.   Healthcare consumer [name]  will: Actively participate in therapy, working towards healthy functioning.    *Justification for Continuation/Discontinuation of Goal: R=Revised, O=Ongoing, A=Achieved, D=Discontinued  Goal 1) Learn and implement skills and strategies to manage anxiety  Likert rating baseline date: 05/06/23 Target Date Goal Was reviewed Status Code Progress towards goal/Likert rating  05/05/2024           O               Goal 2) Learn and implement skills and strategies to manage depression  Likert rating baseline date: 05/06/23 Target Date Goal Was reviewed Status Code Progress towards goal/Likert rating  05/05/2024           O              Goal 3) Understand the impact her family role has had in her life, and learn how to separate and pursue her own interests/goals  Likert rating baseline date: 05/06/23 Target Date Goal Was reviewed Status Code Progress towards goal/Likert rating  05/05/2024           O              Goal 4) Learn how to be more assertive (ie., set limits and boundaries) in a range of settings and experiences  5 Point Likert rating baseline date: 05/06/23 Target Date Goal Was reviewed Status Code Progress towards goal/Likert rating  05/05/2024            O                This plan has been reviewed and created by the following participants:  This plan will be reviewed at least every 12 months. Date Behavioral Health Clinician Date Guardian/Patient   05/06/2023 Ronal Jenkins Sprung, Ph.D.  05/06/2023 Santa LITTIE. [place]                   Diagnoses:  Adjustment Disorder with mixed anxious and depressed mood   Alani reports that they had a nice time visiting their daughter in [place].  We d/e/p that it was good to be away for the first Thanksgiving holiday without her mother and mother-in-law.  She shared a poignant scenario that took place at her temple that reminded her of how these women knew one another and shared in the raising of their children, saw their children marry, have children and raised their grandchildren together in the same community.  We d/p how this experience was the definition of bittersweet.  I continue to create and provide a safe space for Rhiann to d/p her loss and grief.  Homework: attend [facility] meetings starting in January once they have passed  the year mark of her mother's death  Ronal Jenkins Sprung, PhD   Husband: [name] Sister: Rock Brother-in-law: Dick

## 2024-03-29 ENCOUNTER — Ambulatory Visit

## 2024-03-29 ENCOUNTER — Other Ambulatory Visit: Payer: Self-pay

## 2024-03-29 VITALS — BP 128/82 | Ht 64.0 in | Wt 130.0 lb

## 2024-03-29 DIAGNOSIS — M25561 Pain in right knee: Secondary | ICD-10-CM

## 2024-03-29 DIAGNOSIS — M7051 Other bursitis of knee, right knee: Secondary | ICD-10-CM | POA: Diagnosis not present

## 2024-03-29 NOTE — Progress Notes (Signed)
 PCP: [name], MD  Subjective:   HPI: Patient is a 68 y.o. female here for right knee pain.  Pain was in [place] and while looking at a sign perform some type of twisting motion which initially caused pain at the posterior right knee.  Since coming home she has tried some Mobic  which has been helpful but her pain has migrated to the distal medial knee.  She states she mostly feels the pain when walking.  She has no pain of the posterior part of her knee at this point.  She denies any catching or locking but states that she does sometimes have a sensation of tingling or numbness down the anterior part of her distal leg.  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 EYE ALLERGIES   History of blood transfusion 1984   following childbirth.   History of radiation therapy 1963   after adenoid surgery x 2, they grew back and she was treated w/ XRT to the throat   Hypertension    Hypothyroidism    Osteopenia 05/2018   T score -1.4   Pneumonia    as a child (02/12/2016)   Thyroid  nodule    Uterine prolapse     Medications Ordered Prior to Encounter[1]  BP 128/82   Ht 5' 4 (1.626 m)   Wt 130 lb (59 kg)   BMI 22.31 kg/m        Objective:   Physical Exam:  Gen: NAD, comfortable in exam room Right knee Inspection: No erythema or warmth, mild edema present over the pes bursa Palpation: TTP at the pes bursa ROM: Full extension and flexion without pain Special Tests: Negative valgus and varus, negative McMurray's, negative anterior posterior drawer, negative Lachman's Neuro: Neurovascular intact distally  Limited ultrasound of right knee: - Hypoechoic change and irregularity deep to quad tendon - Pes bursa region with increased fluid and Doppler flow  Impression: Pes  anserine bursitis    Assessment/Plan:   Kelly Myers is a 68 y.o. female who was seen today for the following: 1. Right knee pain, unspecified chronicity (Primary) - US  LIMITED JOINT SPACE STRUCTURES LOW RIGHT; Future  2. Pes anserinus bursitis of right knee -US  significant for increased fluid and Doppler flow at the pes bursa - Discussed treatment options including conservative management versus steroid injection  - At this point she would like to pursue conservative management - Recommended  Voltaren  gel, ice, knee sleeve for compression - She can continue her home hip exercises for her previous hip arthritis - If she is not improved in 2 weeks, did discuss steroid injection - Follow-up in 2 weeks  Follow-up/Education:   No follow-ups on file.   May return sooner as needed and encouraged to call/e-mail for additional questions or  worsening symptoms in the interim.  Krystal Lowing, DO Sports Medicine Fellow 03/29/2024 11:25 AM     [1]  Current Outpatient Medications on File Prior to Visit  Medication Sig Dispense Refill   bisoprolol  (ZEBETA ) 5 MG tablet Take 1 tablet (5 mg total) by mouth daily. 90 tablet 3   Calcium  Carbonate-Vitamin D  (CALCIUM  + D PO) Take 1 tablet by mouth daily.     Cetirizine HCl 10 MG CAPS Take 10 mg by  mouth daily.     Estradiol  10 MCG TABS vaginal tablet Place 1 tablet (10 mcg total) vaginally 3 (three) times a week. 36 tablet 3   fluticasone  (FLONASE ) 50 MCG/ACT nasal spray Place 2 sprays into both nostrils daily.     glucosamine-chondroitin 500-400 MG tablet Take 1 tablet by mouth daily.      irbesartan  (AVAPRO ) 150 MG tablet Take 1 tablet (150 mg total) by mouth daily. 90 tablet 3   levothyroxine  (SYNTHROID , LEVOTHROID) 100 MCG tablet Take 1 tablet (100 mcg total) by mouth daily before breakfast. 30 tablet 0   meloxicam  (MOBIC ) 15 MG tablet Take 1 tablet (15 mg total) by mouth daily. 30 tablet 2   montelukast  (SINGULAIR ) 10 MG tablet Take 10 mg  by mouth at bedtime.     Nutritional Supplements (ESTROVEN PO) Take by mouth.     rosuvastatin  (CRESTOR ) 10 MG tablet Take 1 tablet (10 mg total) by mouth daily. 90 tablet 1   VENTOLIN HFA 108 (90 Base) MCG/ACT inhaler   0   No current facility-administered medications on file prior to visit.

## 2024-03-30 ENCOUNTER — Ambulatory Visit: Admitting: Psychology

## 2024-03-30 DIAGNOSIS — F411 Generalized anxiety disorder: Secondary | ICD-10-CM

## 2024-03-30 NOTE — Progress Notes (Signed)
 Progress Note:   Name: Kelly Myers Date: 03/30/2024 MRN: [id number] DOB: 01-08-1956 PCP: [name], MD   Time spent: 9:01 AM - 9:59 AM  Annual Review: 05/05/2024   Today I met in person with Kelly Myers  for in office face-to-face individual psychotherapy.   Reason for Visit /Presenting Problem: Kelly Myers presents after she discovered that her husband has been using heavy drugs (ie., cocaine, Adderall).  Addiction problems have been part of his history (ie., gambling).  She confronted him, asked him to stop, and predictably he wasn't able to.  He also goes through 2 bottles of Vodka a week and often drinks socially.  Kelly Myers has been feeling depressed and anxious.  She has always been a happy, resourceful and resilient person.  Background History:  Kelly Myers and Kelly Myers (72) they have been married for 49 years.  They have two daughters Kelly Myers (92) and Kelly Myers (40).  Kelly Myers lives in [name] and is single.  She works for [name].  Kelly Myers lives in [place] and is married to a Kelly Myers.  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  Kelly Myers and Kelly Myers are both originally from [place].  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 They both have aging parents. Her parents are divorced.  [name]  Her father is deceased.  He died when he was 43 and the longest lived [name]  Kelly Myers is 68 y.o. and lives at home.  His [name] and might be a chartered loss adjuster.   Mental Status Exam: Appearance:   Casual     Behavior:  Appropriate  Motor:  Normal  Speech/Language:   NA  Affect:  Appropriate  Mood:  anxious and depressed  Thought process:  normal  Thought content:    WNL  Sensory/Perceptual  disturbances:    WNL  Orientation:  oriented to person, place, and time/date  Attention:  NA  Concentration:  Good  Memory:  WNL  Fund of knowledge:   Good  Insight:    Good  Judgment:   Good  Impulse Control:  Good   Reported Symptoms:  worrying about different things, not being able to control her worrying, difficulties relaxing, difficulties concentrating, disrupted appetite, difficulties getting motivated or enjoy normally pleasurable things, feeling bad about herself  Risk Assessment: Danger to Self:  No Self-injurious Behavior: No Danger to Others: No Duty to Warn:no Physical Aggression / Myers:No  Access to Firearms a concern: No  Gang Involvement:No .  Substance Abuse History: Current substance abuse: No     Past Psychiatric History:   Previous psychological history is significant for n/a Outpatient Providers:  History of Psych Hospitalization: No  Psychological Testing: n/a   Abuse History:  Victim of: No., n/a   Report needed: No. Victim of Neglect:No. Perpetrator of n/a  Witness / Exposure to Domestic Myers: No   Protective Services Involvement: No  Witness to Kelly Myers:  No   Family History:  Family History  Problem Relation Age of Onset   Hypertension Myers    Hypertension Father    Cancer Father        lung   Hypertension Sister  Heart disease Sister    Heart attack Sister    Heart disease Maternal Grandfather      Medical History/Surgical History: reviewed Past Medical History:  Diagnosis Date   Asthma    Asthma cough- meds control   Atrophic vaginitis    Bleeds easily    have had clotting studies, tests, I'm within normal limits (02/12/2016)   Endometriosis    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 EYE ALLERGIES   History of blood transfusion 1984   following childbirth.   History of radiation therapy 1963   after adenoid surgery x 2, they grew back and she was treated w/ XRT to the throat   Hypertension     Hypothyroidism    Osteopenia 05/2018   T score -1.4   Pneumonia    as a child (02/12/2016)   Thyroid  nodule    Uterine prolapse     Past Surgical History:  Procedure Laterality Date   ADENOIDECTOMY  1960; 1963   ANTERIOR AND POSTERIOR VAGINAL REPAIR  2000s   BLADDER SUSPENSION  2000s   CESAREAN SECTION  1984   COLONOSCOPY WITH PROPOFOL  N/A 11/04/2015   Procedure: COLONOSCOPY WITH PROPOFOL ;  Surgeon: Gladis MARLA Louder, MD;  Location: [facility];  Service: Endoscopy;  Laterality: N/A;   TUBAL LIGATION  1984   VAGINAL HYSTERECTOMY  2000s   w/bladder suspension and A/P repair   WISDOM TOOTH EXTRACTION  1974    Medications: Current Outpatient Medications  Medication Sig Dispense Refill   bisoprolol  (ZEBETA ) 5 MG tablet Take 1 tablet (5 mg total) by mouth daily. 90 tablet 3   Calcium  Carbonate-Vitamin D  (CALCIUM  + D PO) Take 1 tablet by mouth daily.     Cetirizine HCl 10 MG CAPS Take 10 mg by mouth daily.     Estradiol  10 MCG TABS vaginal tablet Place 1 tablet (10 mcg total) vaginally 3 (three) times a week. 36 tablet 3   fluticasone  (FLONASE ) 50 MCG/ACT nasal spray Place 2 sprays into both nostrils daily.     glucosamine-chondroitin 500-400 MG tablet Take 1 tablet by mouth daily.      irbesartan  (AVAPRO ) 150 MG tablet Take 1 tablet (150 mg total) by mouth daily. 90 tablet 3   levothyroxine  (SYNTHROID , LEVOTHROID) 100 MCG tablet Take 1 tablet (100 mcg total) by mouth daily before breakfast. 30 tablet 0   meloxicam  (MOBIC ) 15 MG tablet Take 1 tablet (15 mg total) by mouth daily. 30 tablet 2   montelukast  (SINGULAIR ) 10 MG tablet Take 10 mg by mouth at bedtime.     Nutritional Supplements (ESTROVEN PO) Take by mouth.     rosuvastatin  (CRESTOR ) 10 MG tablet Take 1 tablet (10 mg total) by mouth daily. 90 tablet 1   VENTOLIN HFA 108 (90 Base) MCG/ACT inhaler   0   No current facility-administered medications for this visit.    Allergies  Allergen Reactions   Metoprolol       Irritates asthma   Macrodantin [Nitrofurantoin] Hives   Neosporin [Neomycin-Bacitracin Zn-Polymyx] Rash     Individualized Treatment Plan        Strengths: strong family connections, helpful, caring, optimistic  Supports: adult children, sister   Goal/Needs for Treatment:  In order of importance to patient 1) Learn and implement skills and strategies to manage anxiety 2) Learn and implement skills and strategies to manage depression 3) Understand the impact her family role has had in her life, and learn how to separate and pursue her own interests/goals 4) Learn  how to be more assertive (ie., set limits and boundaries) in a range of settings and experiences   Client Statement of Needs: Learn life skills that she feels she doesn't have, learning to be more open, honest and rely on other people, learn to be more her own person with her own friends and interests,    Treatment Level: Weekly Outpatient Individual Psychotherapy  Symptoms: anxious, can't stop worrying, not able to let things go, distracted, difficulty sleeping, depressed,   Client Treatment Preferences:  Referred by Dr. Coni   Healthcare consumer's goal for treatment:  Psychologist, Ronal Jenkins Sprung, Ph.D  will support the patient's ability to achieve the goals identified. Cognitive Behavioral Therapy, Dialectical Behavioral Therapy, Motivational Interviewing, Behavioral Motivation, and other evidenced-based practices will be used to promote progress towards healthy functioning.   Healthcare consumer Kelly Myers  will: Actively participate in therapy, working towards healthy functioning.    *Justification for Continuation/Discontinuation of Goal: R=Revised, O=Ongoing, A=Achieved, D=Discontinued  Goal 1) Learn and implement skills and strategies to manage anxiety  Likert rating baseline date: 05/06/23 Target Date Goal Was reviewed Status Code Progress towards goal/Likert rating  05/05/2024           O               Goal 2) Learn and implement skills and strategies to manage depression  Likert rating baseline date: 05/06/23 Target Date Goal Was reviewed Status Code Progress towards goal/Likert rating  05/05/2024           O              Goal 3) Understand the impact her family role has had in her life, and learn how to separate and pursue her own interests/goals  Likert rating baseline date: 05/06/23 Target Date Goal Was reviewed Status Code Progress towards goal/Likert rating  05/05/2024           O              Goal 4) Learn how to be more assertive (ie., set limits and boundaries) in a range of settings and experiences  5 Point Likert rating baseline date: 05/06/23 Target Date Goal Was reviewed Status Code Progress towards goal/Likert rating  05/05/2024            O                This plan has been reviewed and created by the following participants:  This plan will be reviewed at least every 12 months. Date Behavioral Health Clinician Date Guardian/Patient   05/06/2023 Ronal Jenkins Sprung, Ph.D.  05/06/2023 Kelly Myers                   Diagnoses:  Adjustment Disorder with mixed anxious and depressed mood   Kelly Myers reports that she met with her ortho because her knee started to feel worse.  We d/p what occurred, how she felt about the treatment options, her decision, and feeling better about herself.  We d/e/p an argument she had with her husband, feeling dismissed, and confronting him about his drinking.  We d/e/p contributing factors to the situation, ways to communicate how his behavior impacts her/his family more clearly, and allowing herself the space to process before entering into a d/ about a particular problem.  Kelly Myers is still planning to attend [facility] meeting in the new year after the holidays and their mothers' donnamae is behind her.  Homework: attend [employer] starting in January once  they have passed the year mark of her Myers's death  Ronal Jenkins Sprung, PhD   Husband:  Kelly Myers Sister: Rock Myers-in-law: Dick

## 2024-04-19 ENCOUNTER — Ambulatory Visit

## 2024-04-27 ENCOUNTER — Ambulatory Visit: Admitting: Psychology

## 2024-04-27 DIAGNOSIS — F411 Generalized anxiety disorder: Secondary | ICD-10-CM | POA: Diagnosis not present

## 2024-04-27 NOTE — Progress Notes (Signed)
 "      Progress Note:   Name: Kelly Myers Date: 04/27/2024 MRN: [id number] DOB: [birth year] PCP: [name], MD   Time spent: 11:01 AM - 11:59 AM  Annual Review: 05/05/2024   Today I met in person with Kelly [place]  for in office face-to-face individual psychotherapy.   Reason for Visit /Presenting Problem: [place] presents after she discovered that her husband has been using heavy drugs (ie., cocaine, Adderall).  Addiction problems have been part of Kelly history (ie., gambling).  She confronted him, asked him to stop, and predictably he wasn't able to.  He also goes through 2 bottles of Vodka a week and often drinks socially.  Kelly Myers has been feeling depressed and anxious.  She has always been a happy, resourceful and resilient person.  Background History:  Kelly [place] (71) they have been married for 49 years.  They have two daughters Kelly Myers (63) and Kelly Myers (40).  Kelly Myers lives in [place] and is single.  She works for [place].  Kelly Myers lives in [place] and is married to a [place].  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  Kelly [place] are both originally from [place].  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 They both have aging parents. Her parents are divorced.  Myers lives at [place] and is 69 y.o.  Her father is deceased.  He died when he was 66 and the longest lived Kelly Myers's disease pt.  Kelly Myers is 69 y.o. and lives at home.  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   Mental Status Exam: Appearance:   Casual     Behavior:  Appropriate  Motor:  Normal  Speech/Language:   NA  Affect:  Appropriate  Mood:  anxious and depressed  Thought process:  normal  Thought content:    WNL  Sensory/Perceptual  disturbances:    WNL  Orientation:  oriented to person, place, and time/date  Attention:  NA  Concentration:  Good  Memory:  WNL  Fund of knowledge:   Good  Insight:    Good  Judgment:   Good  Impulse Control:  Good   Reported Symptoms:  worrying about different things, not being able to control her worrying, difficulties relaxing, difficulties concentrating, disrupted appetite, difficulties getting motivated or enjoy normally pleasurable things, feeling bad about herself  Risk Assessment: Danger to Self:  No Self-injurious Behavior: No Danger to Others: No Duty to Warn:no Physical Aggression / Violence:No  Access to Firearms a concern: No  Gang Involvement:No .  Substance Abuse History: Current substance abuse: No     Past Psychiatric History:   Previous psychological history is significant for n/a Outpatient Providers:  History of Psych Hospitalization: No  Psychological Testing: n/a   Abuse History:  Victim of: No., n/a   Report needed: No. Victim of Neglect:No. Perpetrator of n/a  Witness / Exposure to Domestic Violence: No   Protective Services Involvement: No  Witness to [place] Violence:  No   Family History:  Family History  Problem Relation Age of Onset   Hypertension Myers    Hypertension Father    Cancer Father        lung   Hypertension Sister  Heart disease Sister    Heart attack Sister    Heart disease Maternal Grandfather      Medical History/Surgical History: reviewed Past Medical History:  Diagnosis Date   Asthma    Asthma cough- meds control   Atrophic vaginitis    Bleeds easily    have had clotting studies, tests, I'm within normal limits (02/12/2016)   Endometriosis    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 EYE ALLERGIES   History of blood transfusion 1984   following childbirth.   History of radiation therapy 1963   after adenoid surgery x 2, they grew back and she was treated w/ XRT to the throat   Hypertension     Hypothyroidism    Osteopenia 05/2018   T score -1.4   Pneumonia    as a child (02/12/2016)   Thyroid  nodule    Uterine prolapse     Past Surgical History:  Procedure Laterality Date   ADENOIDECTOMY  1960; 1963   ANTERIOR AND POSTERIOR VAGINAL REPAIR  2000s   BLADDER SUSPENSION  2000s   CESAREAN SECTION  1984   COLONOSCOPY WITH PROPOFOL  N/A 11/04/2015   Procedure: COLONOSCOPY WITH PROPOFOL ;  Surgeon: Gladis MARLA Louder, MD;  Location: [facility];  Service: Endoscopy;  Laterality: N/A;   TUBAL LIGATION  1984   VAGINAL HYSTERECTOMY  2000s   w/bladder suspension and A/P repair   WISDOM TOOTH EXTRACTION  1974    Medications: Current Outpatient Medications  Medication Sig Dispense Refill   bisoprolol  (ZEBETA ) 5 MG tablet Take 1 tablet (5 mg total) by mouth daily. 90 tablet 3   Calcium  Carbonate-Vitamin D  (CALCIUM  + D PO) Take 1 tablet by mouth daily.     Cetirizine HCl 10 MG CAPS Take 10 mg by mouth daily.     Estradiol  10 MCG TABS vaginal tablet Place 1 tablet (10 mcg total) vaginally 3 (three) times a week. 36 tablet 3   fluticasone  (FLONASE ) 50 MCG/ACT nasal spray Place 2 sprays into both nostrils daily.     glucosamine-chondroitin 500-400 MG tablet Take 1 tablet by mouth daily.      irbesartan  (AVAPRO ) 150 MG tablet Take 1 tablet (150 mg total) by mouth daily. 90 tablet 3   levothyroxine  (SYNTHROID , LEVOTHROID) 100 MCG tablet Take 1 tablet (100 mcg total) by mouth daily before breakfast. 30 tablet 0   meloxicam  (MOBIC ) 15 MG tablet Take 1 tablet (15 mg total) by mouth daily. 30 tablet 2   montelukast  (SINGULAIR ) 10 MG tablet Take 10 mg by mouth at bedtime.     Nutritional Supplements (ESTROVEN PO) Take by mouth.     rosuvastatin  (CRESTOR ) 10 MG tablet Take 1 tablet (10 mg total) by mouth daily. 90 tablet 1   VENTOLIN HFA 108 (90 Base) MCG/ACT inhaler   0   No current facility-administered medications for this visit.    Allergies  Allergen Reactions   Metoprolol       Irritates asthma   Macrodantin [Nitrofurantoin] Hives   Neosporin [Neomycin-Bacitracin Zn-Polymyx] Rash     Individualized Treatment Plan        Strengths: strong family connections, helpful, caring, optimistic  Supports: adult children, sister   Goal/Needs for Treatment:  In order of importance to patient 1) Learn and implement skills and strategies to manage anxiety 2) Learn and implement skills and strategies to manage depression 3) Understand the impact her family role has had in her life, and learn how to separate and pursue her own interests/goals 4) Learn  how to be more assertive (ie., set limits and boundaries) in a range of settings and experiences   Client Statement of Needs: Learn life skills that she feels she doesn't have, learning to be more open, honest and rely on other people, learn to be more her own person with her own friends and interests,    Treatment Level: Weekly Outpatient Individual Psychotherapy  Symptoms: anxious, can't stop worrying, not able to let things go, distracted, difficulty sleeping, depressed,   Client Treatment Preferences:  Referred by Dr. Coni   Healthcare consumer's goal for treatment:  Psychologist, [place], [place]  will support the patient's ability to achieve the goals identified. Cognitive Behavioral Therapy, Dialectical Behavioral Therapy, Motivational Interviewing, Behavioral Motivation, and other evidenced-based practices will be used to promote progress towards healthy functioning.   Healthcare consumer Kelly [place]  will: Actively participate in therapy, working towards healthy functioning.    *Justification for Continuation/Discontinuation of Goal: R=Revised, O=Ongoing, A=Achieved, D=Discontinued  Goal 1) Learn and implement skills and strategies to manage anxiety  Likert rating baseline date: 05/06/23 Target Date Goal Was reviewed Status Code Progress towards goal/Likert rating  05/05/2024           O               Goal 2) Learn and implement skills and strategies to manage depression  Likert rating baseline date: 05/06/23 Target Date Goal Was reviewed Status Code Progress towards goal/Likert rating  05/05/2024           O              Goal 3) Understand the impact her family role has had in her life, and learn how to separate and pursue her own interests/goals  Likert rating baseline date: 05/06/23 Target Date Goal Was reviewed Status Code Progress towards goal/Likert rating  05/05/2024           O              Goal 4) Learn how to be more assertive (ie., set limits and boundaries) in a range of settings and experiences  5 Point Likert rating baseline date: 05/06/23 Target Date Goal Was reviewed Status Code Progress towards goal/Likert rating  05/05/2024            O                This plan has been reviewed and created by the following participants:  This plan will be reviewed at least every 12 months. Date Behavioral Health Clinician Date Guardian/Patient   05/06/2023 [place], [place].  05/06/2023 [place]                   Diagnoses:  Adjustment Disorder with mixed anxious and depressed mood   Laisa reports that their anniversary and mothers' donnamae is behind her.  We d/e/p the various events and holidays, having the losses land hard, and effecting a shift in her relationship with her sisters.  Itzia states that she plans to attend her first [place] meeting on Monday.    Homework: attend [facility] meetings starting in January once they have passed the year mark of her Myers's death  Kelly Jenkins Sprung, PhD   Husband: Myers Sister: Rock Myers-in-law: Dick   "

## 2024-05-11 ENCOUNTER — Ambulatory Visit: Admitting: Psychology

## 2024-05-25 ENCOUNTER — Ambulatory Visit: Admitting: Psychology

## 2024-06-08 ENCOUNTER — Ambulatory Visit: Admitting: Psychology

## 2024-07-12 ENCOUNTER — Ambulatory Visit: Admitting: Nurse Practitioner
